# Patient Record
Sex: Female | Born: 1950 | Race: White | Hispanic: No | Marital: Single | Smoking: Never smoker
Health system: Southern US, Community
[De-identification: ages and names within clinical notes are randomized; demographics above are authoritative.]

## PROBLEM LIST (undated history)

## (undated) DIAGNOSIS — Z923 Personal history of irradiation: Secondary | ICD-10-CM

## (undated) DIAGNOSIS — E039 Hypothyroidism, unspecified: Secondary | ICD-10-CM

## (undated) DIAGNOSIS — Z9221 Personal history of antineoplastic chemotherapy: Secondary | ICD-10-CM

## (undated) DIAGNOSIS — M8580 Other specified disorders of bone density and structure, unspecified site: Secondary | ICD-10-CM

## (undated) DIAGNOSIS — B159 Hepatitis A without hepatic coma: Secondary | ICD-10-CM

## (undated) DIAGNOSIS — T7840XA Allergy, unspecified, initial encounter: Secondary | ICD-10-CM

## (undated) DIAGNOSIS — C55 Malignant neoplasm of uterus, part unspecified: Secondary | ICD-10-CM

## (undated) DIAGNOSIS — C50912 Malignant neoplasm of unspecified site of left female breast: Secondary | ICD-10-CM

## (undated) DIAGNOSIS — Z9289 Personal history of other medical treatment: Secondary | ICD-10-CM

## (undated) DIAGNOSIS — J449 Chronic obstructive pulmonary disease, unspecified: Secondary | ICD-10-CM

## (undated) DIAGNOSIS — C50919 Malignant neoplasm of unspecified site of unspecified female breast: Secondary | ICD-10-CM

## (undated) HISTORY — PX: CARPAL TUNNEL RELEASE: SHX101

## (undated) HISTORY — PX: SPINAL FUSION: SHX223

## (undated) HISTORY — DX: Malignant neoplasm of uterus, part unspecified: C55

## (undated) HISTORY — DX: Other specified disorders of bone density and structure, unspecified site: M85.80

## (undated) HISTORY — DX: Personal history of irradiation: Z92.3

## (undated) HISTORY — DX: Chronic obstructive pulmonary disease, unspecified: J44.9

## (undated) HISTORY — DX: Hepatitis a without hepatic coma: B15.9

## (undated) HISTORY — DX: Malignant neoplasm of unspecified site of left female breast: C50.912

## (undated) HISTORY — DX: Personal history of other medical treatment: Z92.89

## (undated) HISTORY — DX: Personal history of antineoplastic chemotherapy: Z92.21

## (undated) HISTORY — DX: Hypothyroidism, unspecified: E03.9

## (undated) HISTORY — DX: Malignant neoplasm of unspecified site of unspecified female breast: C50.919

## (undated) HISTORY — DX: Allergy, unspecified, initial encounter: T78.40XA

---

## 1977-06-22 HISTORY — PX: ABDOMINAL HYSTERECTOMY: SHX81

## 1986-06-22 DIAGNOSIS — B159 Hepatitis A without hepatic coma: Secondary | ICD-10-CM

## 1986-06-22 HISTORY — DX: Hepatitis a without hepatic coma: B15.9

## 2006-05-27 ENCOUNTER — Ambulatory Visit (HOSPITAL_COMMUNITY): Admission: RE | Admit: 2006-05-27 | Discharge: 2006-05-27 | Payer: Self-pay | Admitting: Specialist

## 2007-02-09 ENCOUNTER — Inpatient Hospital Stay (HOSPITAL_COMMUNITY): Admission: RE | Admit: 2007-02-09 | Discharge: 2007-02-11 | Payer: Self-pay | Admitting: Specialist

## 2007-04-14 ENCOUNTER — Ambulatory Visit: Payer: Self-pay

## 2007-09-08 ENCOUNTER — Encounter: Admission: RE | Admit: 2007-09-08 | Discharge: 2007-09-08 | Payer: Self-pay | Admitting: Orthopaedic Surgery

## 2008-05-22 ENCOUNTER — Ambulatory Visit: Payer: Self-pay | Admitting: Family Medicine

## 2009-08-28 ENCOUNTER — Ambulatory Visit: Payer: Self-pay | Admitting: Family Medicine

## 2010-09-30 ENCOUNTER — Ambulatory Visit: Payer: Self-pay

## 2010-10-29 ENCOUNTER — Ambulatory Visit: Payer: Self-pay

## 2010-11-04 NOTE — H&P (Signed)
[name], [name]                 ACCOUNT NO.:  [id number]   MEDICAL RECORD NO.:  [id number]           PATIENT TYPE:   LOCATION:                                 FACILITY:   PHYSICIAN:  Jene Every, M.D.    DATE OF BIRTH:  25-Feb-1951   DATE OF ADMISSION:  02/09/2007  DATE OF DISCHARGE:                              HISTORY & PHYSICAL   CHIEF COMPLAINT:  Back, leg and butt pain bilaterally.   HISTORY:  [name] is a pleasant 60 year old female, who sustained a  work-related injury, back in 2007.  She was found to do quite a bit of  repetitive work.  She had mainly low back pain at that time.  She was  initially seen at [facility], diagnosed with lumbar strain and  treated conservatively.  The patient subsequently developed a radicular-  type pain, was seen in our office for evaluation in November 2007, at  which point we obtained an MRI study which showed did show a grade 1-1/2  listhesis, as well as spinal stenosis L5-S1.  She has been sent for a  series of epidural steroid injections, which only gave her approximately  10% relief.  At this point,  further treatment options were discussed  with the patient.  Dr. Shelle Iron felt that her pain was facet mediated and  that she would benefit from first set screw fixation at L5-S1, to  stabilize the listhesis.  The risks and benefits of this were discussed  with the patient.  Pre-operative clearance was obtained, and she does  wish to proceed.   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   CURRENT MEDICATIONS:  Include ibuprofen p.r.n., over the counter allergy  medication, aspirin 81 mg daily, Centrum Silver one daily, black cohosh  root, Estroven p.m. q.h.s.   ALLERGIES:  SULFA WHICH CAUSES A RASH.  CODEINE WHICH MAKES HER  NAUSEOUS.   PREVIOUS SURGICAL HISTORY:  History of right carpal tunnel release by  Dr. Merlyn Lot, development of RSD.  She had a hysterectomy approximately 30  years ago, secondary to uterine cancer.   SOCIAL HISTORY:  The patient is single.  She has negative history of  tobacco use, as well as alcohol.  [name]  following surgery.  Primary care physician is Dr. Lorie Phenix at  [facility].   FAMILY HISTORY:  Father deceased at age 29 of heart disease.  Mother  deceased at age 104 of breast cancer.  Grandmother had a history of  rheumatoid arthritis.   REVIEW OF SYSTEMS:  GENERAL:  The patient denies any fever, chills,  night sweats or bleeding tendencies.  CNS:  No blurred double vision,  seizure, headache or paralysis.  RESPIRATORY:  No shortness of breath,  productive cough or hemoptysis.  CARDIOVASCULAR:  No chest pain, angina  or orthopnea.  GU:  No dysuria, hematuria, discharge.  GI:  No  nausea,  vomiting, diarrhea, constipation, melena or bloody stools.  MUSCULOSKELETAL:  As pertinent in HPI.   PHYSICAL EXAMINATION:  VITAL SIGNS:  Pulse is 60, respiratory 16, BP  130/86.  GENERAL:  This is well-developed, well-nourished female, sitting upright  in no acute distress.  She is slightly anxious in nature.  HEENT:  Atraumatic, normocephalic.  Pupils equal round and reactive to light,  EOMs intact.  NECK:  Supple.  No lymphadenopathy.  CHEST:  Clear to auscultation bilaterally.  No rhonchi, wheezes or  rales.  BREAST:  Not examined.  Not pertinent to HPI.  HEART:  Regular rate and rhythm without murmurs, gallops or rubs.  ABDOMEN:  Soft, nontender, nondistended.  Bowel sounds x4.  SKIN:  No rashes or lesions are noted.  Regarding the back, she does  have pain with forward flexion and extension.  She is tender to  palpation along the lower lumbar region.  She does have diminished  Achilles, as well as plantar flexion bilaterally.   IMPRESSION:  Spinal  stenosis, spondylolisthesis of L5-S1 with facet  arthropathy.   PLAN:  The patient to be admitted to [facility] to undergo L5-  S1, bilateral facet screw fixation, with local and autograft bone.      Roma Schanz, P.A.      Jene Every, M.D.  Electronically Signed    CS/MEDQ  D:  02/01/2007  T:  02/02/2007  Job:  [id number]

## 2010-11-04 NOTE — Op Note (Signed)
[name], [name]                 ACCOUNT NO.:  [id number]   MEDICAL RECORD NO.:  [id number]          PATIENT TYPE:  INP   LOCATION:  0004                         FACILITY:  [facility]   PHYSICIAN:  Jene Every, M.D.    DATE OF BIRTH:  [birth year]   DATE OF PROCEDURE:  02/09/2007  DATE OF DISCHARGE:                               OPERATIVE REPORT   PREOPERATIVE DIAGNOSIS:  Spinal stenosis, spondylolisthesis L5-S1.   POSTOPERATIVE DIAGNOSIS:  Spinal stenosis, spondylolisthesis L5-S1.   PROCEDURE PERFORMED:  1. Bilateral hemilaminotomy, lateral recess decompression,      foraminotomy of L5-S1.  2. Posterior fusion utilizing facet screw fixation L5-S1.  3. Lateral mass fusion utilizing autologous and allograft bone.   ANESTHESIA:  General.   ASSISTANT:  Alvy Beal, MD   BRIEF HISTORY AND INDICATIONS:  A 60 year old spondylolisthesis, spinal  stenosis due to facet arthropathy of 05/01.  She was indicated for  decompression bilaterally due to bilateral hip pain and intermittent leg  pain.  She had some extension type back pain, felt secondary to facet  arthropathy.  She is indicated for bilateral decompression.  I felt that  there was high risk of furthering the spondylolisthesis, so a  concomitant facet fusion was deemed appropriate utilizing autologous and  allograft bone graft.  Risks and benefits discussed including bleeding,  infection, damage to neurovascular structures, CSF leakage, epidural  fibrosis, adjacent segment disease, need for redo fusion in future,  anesthetic complications etc.   TECHNIQUE:  Patient placed in supine position after induction of  adequate anesthesia 1 gram Kefzol she was placed prone on Wilson frame  [employer] table.  All bony points well padded.  Lumbar region prepped  draped in the usual sterile fashion.  An 18 gauge spinal needle was  utilized to localize 5-1 interspace, incision was made from spinous  process of 5 to S1.  Subcutaneous tissue  was dissected.  Electrocautery  utilized achieve hemostasis.  Dorsolumbar fascia identified, divided  line of skin incision.  Paraspinous muscle elevated from lamina of 5 and  S1.  Confirmatory radiograph obtained at the 5-1 interspace.  Severe  hypertrophic facet arthropathy was noted bilaterally with facet  effusions.  Nearly obliterated the interlaminar space at 5-1.  We  performed decompressions bilaterally using the 2 mm Kerrison  undercutting the lamina of five and the facet.  Decompression the medial  border of the pedicle and foraminotomy of S1 was performed as well  utilizing 2 and 3 mm Kerrison.  There was severe lateral recess  stenosis, left greater than right.  Removed ligamentum flavum from the  interspace as well.  Examined the disk.  There was no disk herniation on  the left.  Passed a hockey stick probe out the foramen of 5 and S1,  found to be widely patent.  In a similar fashion we decompressed the  right hemilaminotomies of 5 and S1, decompressing the facet the medial  border of the pedicle, preserving a significant portion the inferior  process of 5 to accept the facet screw.  We decapsulated the facet and  curetted the  facets themselves and used a high-speed bur within the  facets and out into the ala.  Next we packed bone, autologous and  Actifuse bone within the facet joints and out into the ala.  Another x-  ray we used a cannulated neural, first a guide pin through the facet,  angulating into the pedicle into the vertical body in the appropriate  fashion on the AP and lateral x-ray.  I did bilaterally check the  foramen of 5 and 4.  They were found to be widely patent and without  evidence of compromise.  Both sides were drilled, tapped to 35 mm in  depth.  And a facet screw with a washer was utilized bilaterally with  excellent purchase bilaterally and compression of the facet joint.  Following this, a hockey stick probe was placed out in the foramen of 5  and  S1, found to be widely patent with no broaching of the pedicles of 5-  1 and the foramen widely patent.  Wound was copiously irrigated with  antibiotic irrigation.  No evidence of CSF leakage or active bleeding.  Placed autologous bone removed from the facet and Actifuse out into the  lateral masses after decorticating with a bur and a curette.  After this  was all confirmed by x-ray in the AP and lateral plane in terms of screw  fixation and placement.  Next McCullough retractors removed.  Copious  irrigation was utilized. electrocautery utilized to achieve hemostasis.  Dorsolumbar fascia reapproximated with #1 Vicryl interrupted figure-of-  eight sutures.  Subcutaneous tissue reapproximated 2-0 Vicryl simple  sutures. The skin was reapproximated with staples.  We placed a Hemovac  in the deep paraspinous musculature and the subcutaneous tissue and  connected the Hemovac.  Sterile dressing was applied.  She was placed  supine on hospital bed, extubated without difficulty and transported to  the recovery room in satisfactory condition.  The patient tolerated the  procedure well without complication.      Jene Every, M.D.  Electronically Signed     JB/MEDQ  D:  02/09/2007  T:  02/10/2007  Job:  [id number]   cc:   Alvy Beal, MD  Fax: [phone]

## 2011-04-03 LAB — BASIC METABOLIC PANEL
BUN: 12
Calcium: 10.2
Calcium: 9.3
Creatinine, Ser: 0.99
GFR calc non Af Amer: 58 — ABNORMAL LOW
GFR calc non Af Amer: >60
Potassium: 3.9
Sodium: 144

## 2011-04-03 LAB — URINALYSIS, ROUTINE W REFLEX MICROSCOPIC
Ketones, ur: NEGATIVE
Nitrite: NEGATIVE
Protein, ur: NEGATIVE
Urobilinogen, UA: 0.2

## 2011-04-03 LAB — TYPE AND SCREEN
ABO/RH(D): A POS
Antibody Screen: NEGATIVE

## 2011-04-03 LAB — CBC
HCT: 39.8
Hemoglobin: 11.2 — ABNORMAL LOW
Hemoglobin: 13.1
MCHC: 32.9
MCHC: 33.6
MCV: 90.2
MCV: 90.7
Platelets: 238
RBC: 3.69 — ABNORMAL LOW
RDW: 12.5
RDW: 13.3
WBC: 6.3
WBC: 8.7

## 2011-04-03 LAB — PROTIME-INR: INR: 0.9

## 2011-04-03 LAB — URINE MICROSCOPIC-ADD ON

## 2011-10-27 ENCOUNTER — Ambulatory Visit: Payer: Self-pay

## 2012-06-22 DIAGNOSIS — J449 Chronic obstructive pulmonary disease, unspecified: Secondary | ICD-10-CM

## 2012-06-22 HISTORY — PX: BREAST LUMPECTOMY: SHX2

## 2012-06-22 HISTORY — PX: BREAST BIOPSY: SHX20

## 2012-06-22 HISTORY — DX: Chronic obstructive pulmonary disease, unspecified: J44.9

## 2012-06-22 HISTORY — PX: PORTACATH PLACEMENT: SHX2246

## 2012-09-14 ENCOUNTER — Ambulatory Visit: Payer: Self-pay | Admitting: Gastroenterology

## 2012-09-14 HISTORY — PX: COLONOSCOPY: SHX174

## 2012-11-25 ENCOUNTER — Ambulatory Visit: Payer: Self-pay | Admitting: Family Medicine

## 2012-12-05 ENCOUNTER — Ambulatory Visit: Payer: Self-pay | Admitting: Family Medicine

## 2012-12-06 ENCOUNTER — Ambulatory Visit (INDEPENDENT_AMBULATORY_CARE_PROVIDER_SITE_OTHER): Payer: Medicare Other | Admitting: General Surgery

## 2012-12-06 ENCOUNTER — Other Ambulatory Visit: Payer: Self-pay

## 2012-12-06 ENCOUNTER — Encounter: Payer: Self-pay | Admitting: General Surgery

## 2012-12-06 VITALS — BP 140/78 | HR 76 | Resp 12 | Ht 63.0 in | Wt 160.0 lb

## 2012-12-06 DIAGNOSIS — N63 Unspecified lump in unspecified breast: Secondary | ICD-10-CM

## 2012-12-06 DIAGNOSIS — N6320 Unspecified lump in the left breast, unspecified quadrant: Secondary | ICD-10-CM

## 2012-12-06 NOTE — Patient Instructions (Signed)

## 2012-12-06 NOTE — Progress Notes (Signed)
Patient ID: [name], female   DOB: 08-17-1950, 62 y.o.   MRN: [id number]  Chief Complaint  Patient presents with  . Procedure    left breast biopsy    HPI [name] is a 62 y.o. female here today for an left breast biopsy. Mammogram and ultrasound done 12-05-12 CAT 4. Family history of breast cancer includes mother. Patient does perform regular self breast checks and gets regular mammograms done. BRCA negative through the [facility] in the past..  HPI  Past Medical History  Diagnosis Date  . Allergy   . Cancer     uterine   . Hepatitis A     Past Surgical History  Procedure Laterality Date  . Abdominal hysterectomy  1979  . Back surgery    . Breast biopsy      Family History  Problem Relation Age of Onset  . Breast cancer Mother     Social History History  Substance Use Topics  . Smoking status: Never Smoker   . Smokeless tobacco: Never Used  . Alcohol Use: No    Allergies  Allergen Reactions  . Codeine     sick  . Sulfa Antibiotics Rash    Current Outpatient Prescriptions  Medication Sig Dispense Refill  . aspirin 81 MG tablet Take 81 mg by mouth daily.      . Multiple Vitamins-Minerals (CENTRUM PO) Take by mouth.      . Pseudoephedrine HCl (SINUS & ALLERGY 12 HOUR PO) Take 1 tablet by mouth.       No current facility-administered medications for this visit.    Review of Systems Review of Systems  Constitutional: Negative.   Respiratory: Negative.   Cardiovascular: Negative.     Blood pressure 140/78, pulse 76, resp. rate 12, height 5\' 3"  (1.6 m), weight 160 lb (72.576 kg).  Physical Exam Physical Exam  Constitutional: She appears well-developed and well-nourished.  Neck: Neck supple.  Cardiovascular: Normal rate and normal heart sounds.   Pulmonary/Chest: Breath sounds normal. Right breast exhibits no inverted nipple, no mass, no nipple discharge, no skin change and no tenderness. Left breast exhibits no inverted nipple, no mass, no  nipple discharge, no skin change and no tenderness.  Fullness at 3 o'clock left breast  Lymphadenopathy:    She has no cervical adenopathy.    She has no axillary adenopathy.  Neurological: She is alert.  Skin: Skin is warm and dry.    Data Reviewed Screening mammogram dated November 25, 2012 showed scattered fibroglandular tissue. An asymmetric density was identified the lateral aspect left breast for which additional views were requested. The right breast was unremarkable. BI-RAD-0.  Focal spot compression views and ultrasound the left breast a December 05, 2012 were reviewed with Dr. Excell Seltzer. An irregular mass that is hypoechoic with posterior acoustic shadowing is identified in the 3:00 position of the breast. A previously identified an unchanged nodule in the upper-outer quadrant is unchanged. A generous lymph node in the left axilla is unchanged from past exams.  Ultrasound examination of the left breast confirmed a 2.2 x 2.4 x 2.5 cmlymph node in the left axilla. No vascular flow was noted. Slight distortion of architecture.  In the left breast at the 3:00 position a 1.1 x 1.3 x 1.4 cm mass that was slightly wider than it was all identified 3 cm from the nipple.  The patient was amenable to biopsy. 10 cc of 0.5% Xylocaine with 0.25% Marcaine with one 200,000 of epinephrine was utilized  well tolerated. Chlor prep was applied to the skin. A 10-gauge Encor device was used to take multiple samples from the lesion. Scant bleeding was noted. The skin defect was closed with benzoin and Steri-Strips followed by Telfa Tegaderm dressing. Instructions were provided to the patient for postoperative wound care.  Assessment    Left breast mass, suspicious for malignancy.     Plan    The patient will be contacted when the pathology is available.        [name] 12/06/2012, 8:48 PM

## 2012-12-08 ENCOUNTER — Ambulatory Visit: Payer: Self-pay | Admitting: Hematology and Oncology

## 2012-12-08 ENCOUNTER — Ambulatory Visit (INDEPENDENT_AMBULATORY_CARE_PROVIDER_SITE_OTHER): Payer: Medicare Other | Admitting: General Surgery

## 2012-12-08 ENCOUNTER — Ambulatory Visit: Payer: Self-pay | Admitting: General Surgery

## 2012-12-08 ENCOUNTER — Encounter: Payer: Self-pay | Admitting: General Surgery

## 2012-12-08 VITALS — BP 140/78 | HR 76 | Resp 14 | Ht 63.0 in | Wt 160.0 lb

## 2012-12-08 DIAGNOSIS — C50912 Malignant neoplasm of unspecified site of left female breast: Secondary | ICD-10-CM

## 2012-12-08 DIAGNOSIS — R599 Enlarged lymph nodes, unspecified: Secondary | ICD-10-CM

## 2012-12-08 DIAGNOSIS — C50412 Malignant neoplasm of upper-outer quadrant of left female breast: Secondary | ICD-10-CM | POA: Insufficient documentation

## 2012-12-08 DIAGNOSIS — Z853 Personal history of malignant neoplasm of breast: Secondary | ICD-10-CM | POA: Insufficient documentation

## 2012-12-08 DIAGNOSIS — C50919 Malignant neoplasm of unspecified site of unspecified female breast: Secondary | ICD-10-CM

## 2012-12-08 DIAGNOSIS — R590 Localized enlarged lymph nodes: Secondary | ICD-10-CM | POA: Insufficient documentation

## 2012-12-08 HISTORY — DX: Malignant neoplasm of unspecified site of left female breast: C50.912

## 2012-12-08 LAB — COMPREHENSIVE METABOLIC PANEL
Alkaline Phosphatase: 106 U/L (ref 50–136)
BUN: 17 mg/dL (ref 7–18)
Bilirubin,Total: 0.3 mg/dL (ref 0.2–1.0)
Calcium, Total: 9.6 mg/dL (ref 8.5–10.1)
Chloride: 105 mmol/L (ref 98–107)
EGFR (African American): >60
Potassium: 3.8 mmol/L (ref 3.5–5.1)
SGOT(AST): 24 U/L (ref 15–37)
Sodium: 139 mmol/L (ref 136–145)
Total Protein: 8 g/dL (ref 6.4–8.2)

## 2012-12-08 LAB — CBC WITH DIFFERENTIAL/PLATELET
Basophil %: 0.7 %
Eosinophil %: 0.6 %
Lymphocyte %: 19.7 %
MCH: 30.7 pg (ref 26.0–34.0)
MCHC: 34.4 g/dL (ref 32.0–36.0)
MCV: 89 fL (ref 80–100)
Monocyte #: 0.5 x10 3/mm (ref 0.2–0.9)
Monocyte %: 6.8 %
Neutrophil #: 5.2 10*3/uL (ref 1.4–6.5)
Neutrophil %: 72.2 %
Platelet: 283 10*3/uL (ref 150–440)
RBC: 4.31 10*6/uL (ref 3.80–5.20)
WBC: 7.2 10*3/uL (ref 3.6–11.0)

## 2012-12-08 NOTE — Patient Instructions (Addendum)
Patient to have labs and chest x-ray at [facility].   This patient is to see Dr. Wendie Simmer at the [facility] on 12-09-12 at 11 am.      CARE AFTER BREAST BIOPSY  1. Leave the dressing on that your doctor applied after surgery. It is waterproof. You may bathe, shower and/or swim. The dressing will probably remain intact until your return office visit. If the dressing comes off, you will see small strips of tape against your skin on the incision. Do not remove these strips.  2. You may want to use a gauze,cloth or similar protection in your bra to prevent rubbing against your dressing and incision. This is not necessary, but you may feel more comfortable doing so.  3. It is recommended that you wear a bra day and night to give support to the breast. This will prevent the weight of the breast from pulling on the incision.  4. Your breast will feel hard and lumpy under the incision. Do not be alarmed. This is the underlying stitching of tissue. Softening of this tissue will occur in time.  5. Make sure you call the office and schedule an appointment in one week after your surgery. The office phone number is [phone]. The [facility] at [facility] may have already done this for you.  6. You will notice about a week after your office visit that the strips of the tape on your incision will begin to loosen. These may then be removed.  7. Report to your doctor any of the following:  * Severe pain not relieved by your pain medication  *Redness of the incision  * Drainage from the incision  *Fever greater than 101 degrees

## 2012-12-08 NOTE — Progress Notes (Signed)
Patient ID: [name], female   DOB: 09/15/1950, 62 y.o.   MRN: [id number]  Chief Complaint  Patient presents with  . Other    discussion    HPI [name] is a 62 y.o. female here today to discuss her recently completed breast biopsy. The patient is accompanied today by her daughter, granddaughter, son and 3). She been notified by phone but the biopsy result to confirm cancer.  The patient tolerated the core biopsy of the left breast without significant discomfort.  Since her last visit her mammograms were reviewed and the long-standing modest enlargement of the left axillary node has undergone significant change with a 50% increase in size and a marked increase in density.  The patient's case was reviewed at the [facility] tumor board today and biopsy of the node felt appropriate as it would change her clinical stage in may make her a candidate for neoadjuvant chemotherapy. This information was reviewed with the patient and her family.      HPI  Past Medical History  Diagnosis Date  . Allergy   . Cancer     uterine   . Hepatitis A     Past Surgical History  Procedure Laterality Date  . Abdominal hysterectomy  1979  . Back surgery    . Breast biopsy      Family History  Problem Relation Age of Onset  . Breast cancer Mother     Social History History  Substance Use Topics  . Smoking status: Never Smoker   . Smokeless tobacco: Never Used  . Alcohol Use: No    Allergies  Allergen Reactions  . Codeine     sick  . Sulfa Antibiotics Rash    Current Outpatient Prescriptions  Medication Sig Dispense Refill  . aspirin 81 MG tablet Take 81 mg by mouth daily.      . Multiple Vitamins-Minerals (CENTRUM PO) Take by mouth.      . Pseudoephedrine HCl (SINUS & ALLERGY 12 HOUR PO) Take 1 tablet by mouth.       No current facility-administered medications for this visit.    Review of Systems Review of Systems  Blood pressure 140/78, pulse 76, resp. rate 14, height  5\' 3"  (1.6 m), weight 160 lb (72.576 kg).  Physical Exam Physical Exam  Data Reviewed Pathology shows invasive mammary cancer in the left breast. Clinical stage I, T1c.  Ultrasound examination of the left axilla shows an enlarged node in the lower aspect. The patient was amenable to core biopsy. 10 cc of 0.5% Xylocaine with 0.25% Marcaine with 1-200,000 of epinephrine was utilized to well tolerated. ChloraPrep was applied to the skin. A 14-gauge Bard Tru-Cut device was used and 3 core samples obtained with scant discomfort. These were hand carry to pathology in formalin for processing. The skin defect was closed with benzoin and Steri-Strips followed by Telfa and Tegaderm dressing.  Assessment    Left breast cancer.    Plan    The patient will meet with medical oncology tomorrow to discuss the results of today's node biopsy and recommendations regarding management/neoadjuvant chemotherapy.    Patient to have labs (CBC, Met C, CEA, and CA 27-29) and chest x-ray at [facility].    This patient is to see Dr. Wendie Simmer at the [facility] on 12-09-12 at 11 am.    [name] 12/08/2012, 3:17 PM

## 2012-12-09 ENCOUNTER — Ambulatory Visit: Payer: Self-pay | Admitting: Hematology and Oncology

## 2012-12-09 ENCOUNTER — Telehealth: Payer: Self-pay | Admitting: General Surgery

## 2012-12-09 ENCOUNTER — Other Ambulatory Visit: Payer: Self-pay | Admitting: General Surgery

## 2012-12-09 DIAGNOSIS — C50919 Malignant neoplasm of unspecified site of unspecified female breast: Secondary | ICD-10-CM

## 2012-12-09 LAB — PATHOLOGY

## 2012-12-09 NOTE — Telephone Encounter (Signed)
The patient was notified by phone of the results of her core biopsy completed yesterday afternoon of the left axillary node. This did show an embolic tumor deposits consistent with 8 adenocarcinoma source. Node was not however totally replaced with malignancy.  She is a candidate for neoadjuvant chemotherapy on discussion with Dr. Wendie Simmer.  The patient had been apprised of the pros and cons of power port placement prior to her node biopsy yesterday, and she is amenable to proceed.  This will be scheduled for the week of 07/14/2012.

## 2012-12-12 ENCOUNTER — Telehealth: Payer: Self-pay | Admitting: *Deleted

## 2012-12-12 NOTE — Telephone Encounter (Signed)
Patient has been contacted today regarding port placement that has been scheduled for 12-16-12 at [facility]. This patient is to pre-admit 12-13-12 at 2:15 pm. It is okay for patient to continue 81 mg aspirin. She will call if she has further questions.

## 2012-12-13 ENCOUNTER — Encounter: Payer: Self-pay | Admitting: General Surgery

## 2012-12-14 ENCOUNTER — Ambulatory Visit (INDEPENDENT_AMBULATORY_CARE_PROVIDER_SITE_OTHER): Payer: Medicare Other | Admitting: *Deleted

## 2012-12-14 DIAGNOSIS — C50919 Malignant neoplasm of unspecified site of unspecified female breast: Secondary | ICD-10-CM

## 2012-12-14 NOTE — Progress Notes (Signed)
Patient here today for follow up post left breast biopsy. No dressing or steristrip in place.  Minimal bruising noted.  The patient is aware that a heating pad may be used for comfort as needed.  Aware of pathology. Follow up as scheduled for port Friday

## 2012-12-14 NOTE — Patient Instructions (Addendum)
Continue self breast exams. Call office for any new breast issues or concerns. Surgery Friday for port a cath

## 2012-12-15 ENCOUNTER — Telehealth: Payer: Self-pay

## 2012-12-15 NOTE — Telephone Encounter (Signed)
Patient called wanting blood laboratory results. Results reviewed with the patient. She has no questions at this time. Will call back if she has any further questions

## 2012-12-16 ENCOUNTER — Ambulatory Visit: Payer: Self-pay | Admitting: General Surgery

## 2012-12-16 DIAGNOSIS — C50919 Malignant neoplasm of unspecified site of unspecified female breast: Secondary | ICD-10-CM

## 2012-12-19 ENCOUNTER — Encounter: Payer: Self-pay | Admitting: General Surgery

## 2012-12-20 ENCOUNTER — Encounter: Payer: Self-pay | Admitting: General Surgery

## 2012-12-20 ENCOUNTER — Ambulatory Visit: Payer: Self-pay | Admitting: Hematology and Oncology

## 2012-12-20 LAB — COMPREHENSIVE METABOLIC PANEL
Alkaline Phosphatase: 101 U/L (ref 50–136)
BUN: 17 mg/dL (ref 7–18)
Bilirubin,Total: 0.4 mg/dL (ref 0.2–1.0)
Chloride: 103 mmol/L (ref 98–107)
Osmolality: 280 (ref 275–301)
SGOT(AST): 17 U/L (ref 15–37)
Total Protein: 7.6 g/dL (ref 6.4–8.2)

## 2012-12-20 LAB — PATHOLOGY

## 2012-12-20 LAB — CBC CANCER CENTER
Basophil %: 0.9 %
HCT: 37.9 % (ref 35.0–47.0)
HGB: 12.8 g/dL (ref 12.0–16.0)
Lymphocyte #: 2.4 x10 3/mm (ref 1.0–3.6)
MCH: 30.7 pg (ref 26.0–34.0)
MCHC: 33.7 g/dL (ref 32.0–36.0)
Monocyte #: 0.5 x10 3/mm (ref 0.2–0.9)
Platelet: 254 x10 3/mm (ref 150–440)
RDW: 13.1 % (ref 11.5–14.5)
WBC: 8.9 x10 3/mm (ref 3.6–11.0)

## 2012-12-27 LAB — CBC CANCER CENTER
Bands: 11 %
Eosinophil: 4 %
Lymphocytes: 33 %
MCH: 31 pg (ref 26.0–34.0)
MCHC: 34 g/dL (ref 32.0–36.0)
MCV: 91 fL (ref 80–100)
Monocytes: 2 %
Platelet: 208 x10 3/mm (ref 150–440)
RBC: 4.24 10*6/uL (ref 3.80–5.20)
WBC: 5.8 x10 3/mm (ref 3.6–11.0)

## 2013-01-03 LAB — CBC CANCER CENTER
Basophil #: 0.1 x10 3/mm (ref 0.0–0.1)
Eosinophil #: 0.1 x10 3/mm (ref 0.0–0.7)
Eosinophil %: 0.8 %
HCT: 38.6 % (ref 35.0–47.0)
HGB: 13.1 g/dL (ref 12.0–16.0)
Lymphocyte #: 2.7 x10 3/mm (ref 1.0–3.6)
Lymphocyte %: 18.6 %
MCHC: 33.8 g/dL (ref 32.0–36.0)
MCV: 90 fL (ref 80–100)
Monocyte %: 7.3 %
Platelet: 203 x10 3/mm (ref 150–440)
RDW: 13.2 % (ref 11.5–14.5)
WBC: 14.6 x10 3/mm — ABNORMAL HIGH (ref 3.6–11.0)

## 2013-01-16 LAB — CBC CANCER CENTER
Basophil %: 1.8 %
HCT: 38.6 % (ref 35.0–47.0)
HGB: 12.8 g/dL (ref 12.0–16.0)
Lymphocyte #: 2.1 x10 3/mm (ref 1.0–3.6)
Lymphocyte %: 28.6 %
MCV: 90 fL (ref 80–100)
Monocyte %: 9.4 %
Neutrophil %: 59.1 %
RBC: 4.29 10*6/uL (ref 3.80–5.20)
RDW: 14 % (ref 11.5–14.5)
WBC: 7.3 x10 3/mm (ref 3.6–11.0)

## 2013-01-16 LAB — BASIC METABOLIC PANEL
Anion Gap: 11 (ref 7–16)
BUN: 25 mg/dL — ABNORMAL HIGH (ref 7–18)
Calcium, Total: 9.6 mg/dL (ref 8.5–10.1)
Chloride: 104 mmol/L (ref 98–107)
Co2: 26 mmol/L (ref 21–32)
EGFR (African American): >60
EGFR (Non-African Amer.): 60 — ABNORMAL LOW
Glucose: 87 mg/dL (ref 65–99)
Osmolality: 285 (ref 275–301)
Potassium: 4.4 mmol/L (ref 3.5–5.1)
Sodium: 141 mmol/L (ref 136–145)

## 2013-01-20 ENCOUNTER — Ambulatory Visit: Payer: Self-pay | Admitting: Hematology and Oncology

## 2013-01-24 LAB — CBC CANCER CENTER
Eosinophil %: 7.4 %
MCH: 31.4 pg (ref 26.0–34.0)
Monocyte #: 0.1 x10 3/mm — ABNORMAL LOW (ref 0.2–0.9)
Monocyte %: 1.7 %
Neutrophil #: 3.1 x10 3/mm (ref 1.4–6.5)
Platelet: 231 x10 3/mm (ref 150–440)
RBC: 3.71 10*6/uL — ABNORMAL LOW (ref 3.80–5.20)
RDW: 13.5 % (ref 11.5–14.5)
WBC: 4.8 x10 3/mm (ref 3.6–11.0)

## 2013-01-31 LAB — ER/PR,IMMUNOHISTOCHEM,PARAFFIN
Estrogen Receptor IHC: 10 %
Progesterone Recp IP: 0 %

## 2013-01-31 LAB — CBC CANCER CENTER
Basophil #: 0.1 x10 3/mm (ref 0.0–0.1)
Basophil %: 2.4 %
Eosinophil #: 0.3 x10 3/mm (ref 0.0–0.7)
Eosinophil %: 7.9 %
HGB: 12.2 g/dL (ref 12.0–16.0)
Lymphocyte #: 1.5 x10 3/mm (ref 1.0–3.6)
Lymphocyte %: 45.5 %
MCHC: 34.4 g/dL (ref 32.0–36.0)
MCV: 92 fL (ref 80–100)
Monocyte %: 16.8 %
Neutrophil #: 0.9 x10 3/mm — ABNORMAL LOW (ref 1.4–6.5)
Neutrophil %: 27.4 %
Platelet: 282 x10 3/mm (ref 150–440)

## 2013-01-31 LAB — HER-2 / NEU, FISH
Avg Num CEP17 probes/nucleus:: 3.1
Avg Num Her-2 signals/nucleus:: 13
HER-2/CEP17 Ratio: 4.27

## 2013-02-06 LAB — BASIC METABOLIC PANEL
Anion Gap: 8 (ref 7–16)
BUN: 18 mg/dL (ref 7–18)
Chloride: 103 mmol/L (ref 98–107)
Co2: 28 mmol/L (ref 21–32)
Osmolality: 280 (ref 275–301)
Potassium: 4.1 mmol/L (ref 3.5–5.1)
Sodium: 139 mmol/L (ref 136–145)

## 2013-02-06 LAB — CBC CANCER CENTER
Basophil %: 1 %
Eosinophil %: 2.9 %
HGB: 12.4 g/dL (ref 12.0–16.0)
MCH: 31.8 pg (ref 26.0–34.0)
Monocyte #: 1.1 x10 3/mm — ABNORMAL HIGH (ref 0.2–0.9)
Monocyte %: 15.9 %
Neutrophil #: 3.3 x10 3/mm (ref 1.4–6.5)
RBC: 3.91 10*6/uL (ref 3.80–5.20)
RDW: 14.2 % (ref 11.5–14.5)
WBC: 6.7 x10 3/mm (ref 3.6–11.0)

## 2013-02-06 LAB — HEPATIC FUNCTION PANEL A (ARMC)
Albumin: 3.9 g/dL (ref 3.4–5.0)
Alkaline Phosphatase: 117 U/L (ref 50–136)
Bilirubin,Total: 0.2 mg/dL (ref 0.2–1.0)
SGOT(AST): 20 U/L (ref 15–37)
SGPT (ALT): 28 U/L (ref 12–78)
Total Protein: 7.3 g/dL (ref 6.4–8.2)

## 2013-02-09 ENCOUNTER — Inpatient Hospital Stay: Payer: Self-pay | Admitting: Hematology and Oncology

## 2013-02-09 LAB — COMPREHENSIVE METABOLIC PANEL
Albumin: 4.1 g/dL (ref 3.4–5.0)
Alkaline Phosphatase: 181 U/L — ABNORMAL HIGH (ref 50–136)
Anion Gap: 6 — ABNORMAL LOW (ref 7–16)
BUN: 15 mg/dL (ref 7–18)
Bilirubin,Total: 0.3 mg/dL (ref 0.2–1.0)
Calcium, Total: 9.7 mg/dL (ref 8.5–10.1)
Chloride: 98 mmol/L (ref 98–107)
Co2: 29 mmol/L (ref 21–32)
Creatinine: 0.79 mg/dL (ref 0.60–1.30)
EGFR (African American): >60
EGFR (Non-African Amer.): >60
Glucose: 99 mg/dL (ref 65–99)
Osmolality: 267 (ref 275–301)
Potassium: 4 mmol/L (ref 3.5–5.1)
SGOT(AST): 27 U/L (ref 15–37)
SGPT (ALT): 39 U/L (ref 12–78)
Sodium: 133 mmol/L — ABNORMAL LOW (ref 136–145)
Total Protein: 7.7 g/dL (ref 6.4–8.2)

## 2013-02-09 LAB — CBC WITH DIFFERENTIAL/PLATELET
Basophil #: 0.2 10*3/uL — ABNORMAL HIGH (ref 0.0–0.1)
Basophil %: 0.3 %
Eosinophil #: 0 10*3/uL (ref 0.0–0.7)
Eosinophil %: 0 %
HCT: 38.1 % (ref 35.0–47.0)
HGB: 12.9 g/dL (ref 12.0–16.0)
Lymphocyte #: 1.1 10*3/uL (ref 1.0–3.6)
Lymphocyte %: 1.9 %
MCH: 30.4 pg (ref 26.0–34.0)
MCHC: 33.8 g/dL (ref 32.0–36.0)
MCV: 90 fL (ref 80–100)
Monocyte #: 0.7 x10 3/mm (ref 0.2–0.9)
Monocyte %: 1.2 %
Neutrophil #: 56.3 10*3/uL — ABNORMAL HIGH (ref 1.4–6.5)
Neutrophil %: 96.6 %
Platelet: 344 10*3/uL (ref 150–440)
RBC: 4.24 10*6/uL (ref 3.80–5.20)
RDW: 14.2 % (ref 11.5–14.5)
WBC: 58.3 10*3/uL — ABNORMAL HIGH (ref 3.6–11.0)

## 2013-02-10 LAB — BASIC METABOLIC PANEL
Anion Gap: 6 — ABNORMAL LOW (ref 7–16)
Co2: 28 mmol/L (ref 21–32)
EGFR (African American): >60
Glucose: 88 mg/dL (ref 65–99)
Osmolality: 280 (ref 275–301)

## 2013-02-10 LAB — CBC WITH DIFFERENTIAL/PLATELET
Eosinophil #: 0.1 10*3/uL (ref 0.0–0.7)
HCT: 36.1 % (ref 35.0–47.0)
HGB: 12.1 g/dL (ref 12.0–16.0)
MCH: 30.3 pg (ref 26.0–34.0)
MCHC: 33.5 g/dL (ref 32.0–36.0)
MCV: 91 fL (ref 80–100)
Monocyte #: 0.7 x10 3/mm (ref 0.2–0.9)
Monocyte %: 1.4 %
Neutrophil #: 46.6 10*3/uL — ABNORMAL HIGH (ref 1.4–6.5)
Neutrophil %: 94.8 %
RBC: 3.98 10*6/uL (ref 3.80–5.20)
RDW: 14.9 % — ABNORMAL HIGH (ref 11.5–14.5)
WBC: 49.3 10*3/uL — ABNORMAL HIGH (ref 3.6–11.0)

## 2013-02-17 LAB — CBC CANCER CENTER
Basophil #: 0.1 x10 3/mm (ref 0.0–0.1)
Eosinophil #: 0.1 x10 3/mm (ref 0.0–0.7)
HCT: 38 % (ref 35.0–47.0)
HGB: 12.6 g/dL (ref 12.0–16.0)
Lymphocyte #: 2.1 x10 3/mm (ref 1.0–3.6)
MCH: 30.3 pg (ref 26.0–34.0)
MCHC: 33.2 g/dL (ref 32.0–36.0)
MCV: 91 fL (ref 80–100)
Monocyte #: 0.8 x10 3/mm (ref 0.2–0.9)
Neutrophil #: 9.7 x10 3/mm — ABNORMAL HIGH (ref 1.4–6.5)
Neutrophil %: 75.4 %
Platelet: 205 x10 3/mm (ref 150–440)
RBC: 4.16 10*6/uL (ref 3.80–5.20)
RDW: 14.4 % (ref 11.5–14.5)

## 2013-02-20 ENCOUNTER — Ambulatory Visit: Payer: Self-pay | Admitting: Hematology and Oncology

## 2013-02-28 LAB — COMPREHENSIVE METABOLIC PANEL
Albumin: 3.9 g/dL (ref 3.4–5.0)
Alkaline Phosphatase: 130 U/L (ref 50–136)
Anion Gap: 8 (ref 7–16)
BUN: 18 mg/dL (ref 7–18)
Bilirubin,Total: 0.3 mg/dL (ref 0.2–1.0)
Creatinine: 1.01 mg/dL (ref 0.60–1.30)
EGFR (African American): >60
EGFR (Non-African Amer.): 60 — ABNORMAL LOW
SGPT (ALT): 37 U/L (ref 12–78)
Sodium: 140 mmol/L (ref 136–145)
Total Protein: 7.3 g/dL (ref 6.4–8.2)

## 2013-02-28 LAB — CBC CANCER CENTER
Eosinophil #: 0.2 x10 3/mm (ref 0.0–0.7)
Eosinophil %: 2.1 %
HCT: 36.9 % (ref 35.0–47.0)
HGB: 12.3 g/dL (ref 12.0–16.0)
MCH: 30.7 pg (ref 26.0–34.0)
MCV: 92 fL (ref 80–100)
Monocyte #: 0.9 x10 3/mm (ref 0.2–0.9)
Monocyte %: 12.5 %
Neutrophil #: 4.2 x10 3/mm (ref 1.4–6.5)
Neutrophil %: 57.6 %
Platelet: 318 x10 3/mm (ref 150–440)
RBC: 4.01 10*6/uL (ref 3.80–5.20)
RDW: 15.3 % — ABNORMAL HIGH (ref 11.5–14.5)
WBC: 7.3 x10 3/mm (ref 3.6–11.0)

## 2013-03-07 LAB — CBC CANCER CENTER
Basophil #: 0.1 x10 3/mm (ref 0.0–0.1)
Eosinophil #: 0.2 x10 3/mm (ref 0.0–0.7)
HGB: 11.6 g/dL — ABNORMAL LOW (ref 12.0–16.0)
Lymphocyte #: 0.9 x10 3/mm — ABNORMAL LOW (ref 1.0–3.6)
MCH: 31 pg (ref 26.0–34.0)
MCHC: 33.5 g/dL (ref 32.0–36.0)
MCV: 93 fL (ref 80–100)
Monocyte %: 2.7 %
Neutrophil %: 60.1 %
Platelet: 268 x10 3/mm (ref 150–440)
RBC: 3.73 10*6/uL — ABNORMAL LOW (ref 3.80–5.20)
RDW: 14.7 % — ABNORMAL HIGH (ref 11.5–14.5)
WBC: 3.3 x10 3/mm — ABNORMAL LOW (ref 3.6–11.0)

## 2013-03-14 LAB — CBC CANCER CENTER
Basophil #: 0.1 x10 3/mm (ref 0.0–0.1)
Eosinophil #: 0.5 x10 3/mm (ref 0.0–0.7)
Eosinophil %: 16.8 %
Lymphocyte #: 1 x10 3/mm (ref 1.0–3.6)
Lymphocyte %: 33 %
MCH: 31.3 pg (ref 26.0–34.0)
MCHC: 33.6 g/dL (ref 32.0–36.0)
MCV: 93 fL (ref 80–100)
Monocyte #: 0.4 x10 3/mm (ref 0.2–0.9)
Monocyte %: 11.2 %
Neutrophil #: 1.1 x10 3/mm — ABNORMAL LOW (ref 1.4–6.5)
Neutrophil %: 36.3 %
Platelet: 225 x10 3/mm (ref 150–440)
WBC: 3.1 x10 3/mm — ABNORMAL LOW (ref 3.6–11.0)

## 2013-03-21 LAB — CBC CANCER CENTER
Basophil #: 0.2 x10 3/mm — ABNORMAL HIGH (ref 0.0–0.1)
Basophil %: 2.9 %
Eosinophil #: 0.4 x10 3/mm (ref 0.0–0.7)
Eosinophil %: 7.6 %
HCT: 37.6 % (ref 35.0–47.0)
Lymphocyte %: 35.2 %
MCH: 31.1 pg (ref 26.0–34.0)
MCHC: 33.3 g/dL (ref 32.0–36.0)
MCV: 94 fL (ref 80–100)
Monocyte #: 1 x10 3/mm — ABNORMAL HIGH (ref 0.2–0.9)
Monocyte %: 19.5 %
Platelet: 372 x10 3/mm (ref 150–440)
RBC: 4.03 10*6/uL (ref 3.80–5.20)
RDW: 16 % — ABNORMAL HIGH (ref 11.5–14.5)

## 2013-03-21 LAB — BASIC METABOLIC PANEL
BUN: 20 mg/dL — ABNORMAL HIGH (ref 7–18)
Calcium, Total: 9 mg/dL (ref 8.5–10.1)
Chloride: 104 mmol/L (ref 98–107)
Co2: 27 mmol/L (ref 21–32)
Creatinine: 1.02 mg/dL (ref 0.60–1.30)
EGFR (African American): >60
EGFR (Non-African Amer.): 59 — ABNORMAL LOW
Osmolality: 283 (ref 275–301)
Potassium: 4 mmol/L (ref 3.5–5.1)
Sodium: 141 mmol/L (ref 136–145)

## 2013-03-22 ENCOUNTER — Ambulatory Visit: Payer: Self-pay | Admitting: Hematology and Oncology

## 2013-03-28 LAB — CBC CANCER CENTER
Basophil #: 0.1 x10 3/mm (ref 0.0–0.1)
Basophil %: 1.5 %
Eosinophil #: 1.2 x10 3/mm — ABNORMAL HIGH (ref 0.0–0.7)
Eosinophil %: 16.7 %
HCT: 36.8 % (ref 35.0–47.0)
HGB: 12.5 g/dL (ref 12.0–16.0)
Lymphocyte #: 1.9 x10 3/mm (ref 1.0–3.6)
Lymphocyte %: 26.6 %
MCH: 31.8 pg (ref 26.0–34.0)
MCHC: 33.9 g/dL (ref 32.0–36.0)
MCV: 94 fL (ref 80–100)
Monocyte #: 0.6 x10 3/mm (ref 0.2–0.9)
Monocyte %: 7.8 %
Neutrophil #: 3.4 x10 3/mm (ref 1.4–6.5)
Neutrophil %: 47.4 %
Platelet: 279 x10 3/mm (ref 150–440)
RBC: 3.93 10*6/uL (ref 3.80–5.20)
RDW: 14.8 % — ABNORMAL HIGH (ref 11.5–14.5)
WBC: 7.2 x10 3/mm (ref 3.6–11.0)

## 2013-03-28 LAB — BASIC METABOLIC PANEL
Anion Gap: 10 (ref 7–16)
BUN: 15 mg/dL (ref 7–18)
Calcium, Total: 8.8 mg/dL (ref 8.5–10.1)
Chloride: 101 mmol/L (ref 98–107)
Co2: 27 mmol/L (ref 21–32)
EGFR (African American): >60
EGFR (Non-African Amer.): 54 — ABNORMAL LOW

## 2013-04-04 LAB — CBC CANCER CENTER
Basophil #: 0.1 x10 3/mm (ref 0.0–0.1)
Basophil %: 0.9 %
Eosinophil %: 19.6 %
Lymphocyte #: 1.5 x10 3/mm (ref 1.0–3.6)
MCHC: 34.7 g/dL (ref 32.0–36.0)
MCV: 93 fL (ref 80–100)
Monocyte %: 6.8 %
Neutrophil %: 47.9 %
RBC: 3.66 10*6/uL — ABNORMAL LOW (ref 3.80–5.20)

## 2013-04-11 LAB — BASIC METABOLIC PANEL
BUN: 18 mg/dL (ref 7–18)
Calcium, Total: 8.5 mg/dL (ref 8.5–10.1)
Chloride: 104 mmol/L (ref 98–107)
Co2: 26 mmol/L (ref 21–32)
Creatinine: 0.98 mg/dL (ref 0.60–1.30)
EGFR (African American): >60
EGFR (Non-African Amer.): >60
Glucose: 89 mg/dL (ref 65–99)
Osmolality: 277 (ref 275–301)
Potassium: 4 mmol/L (ref 3.5–5.1)

## 2013-04-11 LAB — CBC CANCER CENTER
Basophil #: 0.1 x10 3/mm (ref 0.0–0.1)
Basophil %: 1.4 %
Eosinophil #: 0.8 x10 3/mm — ABNORMAL HIGH (ref 0.0–0.7)
HGB: 11.4 g/dL — ABNORMAL LOW (ref 12.0–16.0)
Lymphocyte #: 1.7 x10 3/mm (ref 1.0–3.6)
Lymphocyte %: 31.7 %
MCHC: 33.8 g/dL (ref 32.0–36.0)
Monocyte %: 9.8 %
Neutrophil #: 2.3 x10 3/mm (ref 1.4–6.5)
RBC: 3.57 10*6/uL — ABNORMAL LOW (ref 3.80–5.20)
RDW: 15.3 % — ABNORMAL HIGH (ref 11.5–14.5)
WBC: 5.5 x10 3/mm (ref 3.6–11.0)

## 2013-04-18 LAB — BASIC METABOLIC PANEL
Anion Gap: 10 (ref 7–16)
Chloride: 104 mmol/L (ref 98–107)
Creatinine: 1.1 mg/dL (ref 0.60–1.30)
Glucose: 113 mg/dL — ABNORMAL HIGH (ref 65–99)
Potassium: 4 mmol/L (ref 3.5–5.1)

## 2013-04-18 LAB — CBC CANCER CENTER
Basophil #: 0 x10 3/mm (ref 0.0–0.1)
Basophil %: 0.6 %
Eosinophil #: 0.5 x10 3/mm (ref 0.0–0.7)
Eosinophil %: 9.8 %
HCT: 35.4 % (ref 35.0–47.0)
Lymphocyte #: 1.8 x10 3/mm (ref 1.0–3.6)
MCHC: 33.9 g/dL (ref 32.0–36.0)
Monocyte #: 0.5 x10 3/mm (ref 0.2–0.9)
Neutrophil #: 2.6 x10 3/mm (ref 1.4–6.5)
Neutrophil %: 48 %
RBC: 3.75 10*6/uL — ABNORMAL LOW (ref 3.80–5.20)
RDW: 15.9 % — ABNORMAL HIGH (ref 11.5–14.5)
WBC: 5.3 x10 3/mm (ref 3.6–11.0)

## 2013-04-22 ENCOUNTER — Ambulatory Visit: Payer: Self-pay | Admitting: Hematology and Oncology

## 2013-04-25 LAB — BASIC METABOLIC PANEL
Anion Gap: 9 (ref 7–16)
Calcium, Total: 9.2 mg/dL (ref 8.5–10.1)
Chloride: 104 mmol/L (ref 98–107)
Creatinine: 1.01 mg/dL (ref 0.60–1.30)
EGFR (African American): >60
EGFR (Non-African Amer.): 60 — ABNORMAL LOW
Glucose: 86 mg/dL (ref 65–99)
Osmolality: 279 (ref 275–301)
Potassium: 4.1 mmol/L (ref 3.5–5.1)
Sodium: 139 mmol/L (ref 136–145)

## 2013-04-25 LAB — CBC CANCER CENTER
Basophil #: 0.1 x10 3/mm (ref 0.0–0.1)
Eosinophil #: 0.5 x10 3/mm (ref 0.0–0.7)
Eosinophil %: 10.5 %
HCT: 35.6 % (ref 35.0–47.0)
HGB: 11.6 g/dL — ABNORMAL LOW (ref 12.0–16.0)
Lymphocyte #: 1.8 x10 3/mm (ref 1.0–3.6)
Lymphocyte %: 37.9 %
MCH: 31.3 pg (ref 26.0–34.0)
Monocyte %: 9.5 %
Neutrophil %: 40.9 %
Platelet: 273 x10 3/mm (ref 150–440)
RBC: 3.72 10*6/uL — ABNORMAL LOW (ref 3.80–5.20)

## 2013-04-27 ENCOUNTER — Other Ambulatory Visit: Payer: Self-pay

## 2013-05-02 LAB — BASIC METABOLIC PANEL
Calcium, Total: 9 mg/dL (ref 8.5–10.1)
Creatinine: 1.1 mg/dL (ref 0.60–1.30)
EGFR (African American): >60
EGFR (Non-African Amer.): 54 — ABNORMAL LOW
Glucose: 95 mg/dL (ref 65–99)
Osmolality: 281 (ref 275–301)
Sodium: 139 mmol/L (ref 136–145)

## 2013-05-02 LAB — CBC CANCER CENTER
Basophil #: 0.1 x10 3/mm (ref 0.0–0.1)
Eosinophil #: 0.3 x10 3/mm (ref 0.0–0.7)
HCT: 35 % (ref 35.0–47.0)
MCHC: 33.1 g/dL (ref 32.0–36.0)
Monocyte #: 0.4 x10 3/mm (ref 0.2–0.9)
Neutrophil %: 48.1 %
Platelet: 263 x10 3/mm (ref 150–440)

## 2013-05-09 LAB — BASIC METABOLIC PANEL
BUN: 22 mg/dL — ABNORMAL HIGH (ref 7–18)
Calcium, Total: 9.2 mg/dL (ref 8.5–10.1)
Chloride: 105 mmol/L (ref 98–107)
Co2: 25 mmol/L (ref 21–32)
Creatinine: 1.03 mg/dL (ref 0.60–1.30)
EGFR (African American): >60
Glucose: 88 mg/dL (ref 65–99)
Osmolality: 280 (ref 275–301)
Potassium: 4 mmol/L (ref 3.5–5.1)

## 2013-05-09 LAB — CBC CANCER CENTER
Basophil #: 0.1 x10 3/mm (ref 0.0–0.1)
HCT: 36.2 % (ref 35.0–47.0)
Lymphocyte %: 37.6 %
MCH: 32 pg (ref 26.0–34.0)
MCHC: 33.4 g/dL (ref 32.0–36.0)
MCV: 96 fL (ref 80–100)
Monocyte %: 9.8 %
Platelet: 276 x10 3/mm (ref 150–440)
RDW: 15.2 % — ABNORMAL HIGH (ref 11.5–14.5)
WBC: 5.5 x10 3/mm (ref 3.6–11.0)

## 2013-05-16 LAB — BASIC METABOLIC PANEL
Anion Gap: 9 (ref 7–16)
BUN: 15 mg/dL (ref 7–18)
Calcium, Total: 9.3 mg/dL (ref 8.5–10.1)
Chloride: 105 mmol/L (ref 98–107)
Co2: 26 mmol/L (ref 21–32)
Creatinine: 1.03 mg/dL (ref 0.60–1.30)
EGFR (Non-African Amer.): 58 — ABNORMAL LOW
Glucose: 102 mg/dL — ABNORMAL HIGH (ref 65–99)
Osmolality: 280 (ref 275–301)
Potassium: 4 mmol/L (ref 3.5–5.1)

## 2013-05-16 LAB — CBC CANCER CENTER
Basophil %: 1.2 %
Eosinophil %: 4.7 %
HCT: 35.6 % (ref 35.0–47.0)
HGB: 11.8 g/dL — ABNORMAL LOW (ref 12.0–16.0)
Lymphocyte #: 2 x10 3/mm (ref 1.0–3.6)
MCHC: 33.1 g/dL (ref 32.0–36.0)
MCV: 96 fL (ref 80–100)
Monocyte #: 0.4 x10 3/mm (ref 0.2–0.9)
Monocyte %: 8.1 %
Platelet: 257 x10 3/mm (ref 150–440)
RBC: 3.7 10*6/uL — ABNORMAL LOW (ref 3.80–5.20)

## 2013-05-22 ENCOUNTER — Ambulatory Visit: Payer: Self-pay | Admitting: Hematology and Oncology

## 2013-05-23 LAB — CBC CANCER CENTER
Basophil #: 0.1 x10 3/mm (ref 0.0–0.1)
Basophil %: 1.5 %
Eosinophil #: 0.2 x10 3/mm (ref 0.0–0.7)
Eosinophil %: 4.3 %
HCT: 35.9 % (ref 35.0–47.0)
Lymphocyte #: 2 x10 3/mm (ref 1.0–3.6)
MCHC: 33.1 g/dL (ref 32.0–36.0)
RDW: 15.5 % — ABNORMAL HIGH (ref 11.5–14.5)
WBC: 5.3 x10 3/mm (ref 3.6–11.0)

## 2013-05-23 LAB — BASIC METABOLIC PANEL
Anion Gap: 9 (ref 7–16)
Calcium, Total: 9.3 mg/dL (ref 8.5–10.1)
Chloride: 106 mmol/L (ref 98–107)
Co2: 24 mmol/L (ref 21–32)
Creatinine: 0.87 mg/dL (ref 0.60–1.30)
EGFR (African American): >60
EGFR (Non-African Amer.): >60
Glucose: 102 mg/dL — ABNORMAL HIGH (ref 65–99)
Potassium: 3.9 mmol/L (ref 3.5–5.1)

## 2013-05-30 LAB — BASIC METABOLIC PANEL
Anion Gap: 8 (ref 7–16)
BUN: 18 mg/dL (ref 7–18)
Calcium, Total: 9.1 mg/dL (ref 8.5–10.1)
Chloride: 105 mmol/L (ref 98–107)
Co2: 26 mmol/L (ref 21–32)
Creatinine: 0.99 mg/dL (ref 0.60–1.30)
EGFR (African American): >60
EGFR (Non-African Amer.): >60
Sodium: 139 mmol/L (ref 136–145)

## 2013-05-30 LAB — CBC CANCER CENTER
Basophil #: 0.1 x10 3/mm (ref 0.0–0.1)
Basophil %: 1.2 %
Eosinophil #: 0.3 x10 3/mm (ref 0.0–0.7)
Eosinophil %: 5 %
HCT: 36 % (ref 35.0–47.0)
Lymphocyte #: 2 x10 3/mm (ref 1.0–3.6)
MCH: 31.8 pg (ref 26.0–34.0)
MCHC: 32.9 g/dL (ref 32.0–36.0)
MCV: 97 fL (ref 80–100)
Monocyte #: 0.6 x10 3/mm (ref 0.2–0.9)
Monocyte %: 10.6 %
RBC: 3.73 10*6/uL — ABNORMAL LOW (ref 3.80–5.20)
RDW: 15.6 % — ABNORMAL HIGH (ref 11.5–14.5)
WBC: 5.3 x10 3/mm (ref 3.6–11.0)

## 2013-06-06 LAB — CBC CANCER CENTER
Eosinophil #: 0.2 x10 3/mm (ref 0.0–0.7)
Eosinophil %: 3.3 %
HCT: 36.2 % (ref 35.0–47.0)
HGB: 11.6 g/dL — ABNORMAL LOW (ref 12.0–16.0)
Lymphocyte #: 3 x10 3/mm (ref 1.0–3.6)
MCH: 31.1 pg (ref 26.0–34.0)
MCHC: 32 g/dL (ref 32.0–36.0)
Monocyte #: 0.6 x10 3/mm (ref 0.2–0.9)
RBC: 3.73 10*6/uL — ABNORMAL LOW (ref 3.80–5.20)
RDW: 15.4 % — ABNORMAL HIGH (ref 11.5–14.5)
WBC: 6.6 x10 3/mm (ref 3.6–11.0)

## 2013-06-06 LAB — BASIC METABOLIC PANEL
BUN: 16 mg/dL (ref 7–18)
Creatinine: 0.99 mg/dL (ref 0.60–1.30)
Glucose: 90 mg/dL (ref 65–99)
Osmolality: 276 (ref 275–301)

## 2013-06-22 ENCOUNTER — Ambulatory Visit: Payer: Self-pay | Admitting: Hematology and Oncology

## 2013-06-27 ENCOUNTER — Ambulatory Visit (INDEPENDENT_AMBULATORY_CARE_PROVIDER_SITE_OTHER): Payer: Medicare Other | Admitting: General Surgery

## 2013-06-27 ENCOUNTER — Encounter: Payer: Self-pay | Admitting: General Surgery

## 2013-06-27 ENCOUNTER — Other Ambulatory Visit: Payer: Self-pay | Admitting: General Surgery

## 2013-06-27 ENCOUNTER — Other Ambulatory Visit: Payer: Medicare Other

## 2013-06-27 VITALS — BP 150/82 | HR 76 | Resp 14 | Ht 63.0 in | Wt 175.0 lb

## 2013-06-27 DIAGNOSIS — C50919 Malignant neoplasm of unspecified site of unspecified female breast: Secondary | ICD-10-CM | POA: Insufficient documentation

## 2013-06-27 DIAGNOSIS — N63 Unspecified lump in unspecified breast: Secondary | ICD-10-CM

## 2013-06-27 DIAGNOSIS — C50419 Malignant neoplasm of upper-outer quadrant of unspecified female breast: Secondary | ICD-10-CM | POA: Insufficient documentation

## 2013-06-27 NOTE — Patient Instructions (Addendum)
Lumpectomy A lumpectomy is a form of "breast conserving" or "breast preservation" surgery. It may also be referred to as a partial mastectomy. During a lumpectomy, the portion of the breast that contains the cancerous tumor or breast mass (the lump) is removed. Some normal tissue around the lump may also be removed to make sure all the tumor has been removed. This surgery should take 40 minutes or less. LET [facility] CARE PROVIDER KNOW ABOUT:  Any allergies you have.  All medicines you are taking, including vitamins, herbs, eye drops, creams, and over-the-counter medicines.  Previous problems you or members of your family have had with the use of anesthetics.  Any blood disorders you have.  Previous surgeries you have had.  Medical conditions you have. RISKS AND COMPLICATIONS Generally, this is a safe procedure. However, as with any procedure, complications can occur. Possible complications include:  Bleeding.  Infection.  Pain.  Temporary swelling.  Change in the shape of the breast, particularly if a large portion is removed. BEFORE THE PROCEDURE  Ask your health care provider about changing or stopping your regular medicines.  Do not eat or drink anything for 7 8 hours before the surgery or as directed by your health care provider. Ask your health care provider if you can take a sip of water with any approved medicines.  On the day of surgery, your healthcare provider will use a mammogram or ultrasound to locate and mark the tumor in your breast. These markings on your breast will show where the cut (incision) will be made. PROCEDURE   An IV tube will be put into one of your veins.  You may be given medicine to help you relax before the surgery (sedative). You will be given one of the following:  A medicine that numbs the area (local anesthesia).  A medicine that makes you go to sleep (general anesthesia).  Your health care provider will use a kind of electric scalpel  that uses heat to minimize bleeding (electrocautery knife).  A curved incision (like a smile or frown) that follows the natural curve of your breast is made, to allow for minimal scarring and better healing.  The tumor will be removed with some of the surrounding tissue. This will be sent to the lab for analysis. Your health care provider may also remove your lymph nodes at this time if needed.  Sometimes, but not always, a rubber tube called a drain will be surgically inserted into your breast area or armpit to collect excess fluid that may accumulate in the space where the tumor was. This drain is connected to a plastic bulb on the outside of your body. This drain creates suction to help remove the fluid.  The incisions will be closed with stitches (sutures).  A bandage may be placed over the incisions. AFTER THE PROCEDURE  You will be taken to the recovery area.  You will be given medicine for pain.  A small rubber drain may be placed in the breast for 2 3 days to prevent a collection of blood (hematoma) from developing in the breast. You will be given instructions on caring for the drain before you go home.  A pressure bandage (dressing) will be applied for 1 2 days to prevent bleeding. Ask your health care provider how to care for your bandage at home. Document Released: 07/20/2006 Document Revised: 02/08/2013 Document Reviewed: 11/11/2012 [facility] Patient Information 2014 [place].  Patient's surgery has been scheduled for 07-03-13 at [facility]. It is okay  for patient to continue 81 mg aspirin.

## 2013-06-27 NOTE — Progress Notes (Signed)
Patient ID: [name], female   DOB: [birth year], 63 y.o.   MRN: [id number]  Chief Complaint  Patient presents with  . Breast Problem    evaluate for lumpectomy    HPI [name] is a 63 y.o. female.  Here today for evaluation of a lumpectomy.  She has a known history of breast cancer.Patient states no new problems at this time. Patient last treatment was 06/06/13.  Medical oncology reported this was a HER-2/neu not overexpressing tumor. This is incorrect. HPI  Past Medical History  Diagnosis Date  . Allergy   . Hepatitis A   . Cancer     uterine   . 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 ER+; PR -; HER-2/neu overexpression    Past Surgical History  Procedure Laterality Date  . Abdominal hysterectomy  1979  . Back surgery    . Colonoscopy  2014  . Breast biopsy Left 2014    Family History  Problem Relation Age of Onset  . Breast cancer Mother     Social History History  Substance Use Topics  . Smoking status: Never Smoker   . Smokeless tobacco: Never Used  . Alcohol Use: No    Allergies  Allergen Reactions  . Codeine     sick  . Sulfa Antibiotics Rash    Current Outpatient Prescriptions  Medication Sig Dispense Refill  . aspirin 81 MG tablet Take 81 mg by mouth daily.      Marland Kitchen FIBER SELECT GUMMIES PO Take 1 tablet by mouth as needed.      . minocycline (DYNACIN) 50 MG tablet Take 50 mg by mouth 2 (two) times daily.      . Multiple Vitamins-Minerals (CENTRUM PO) Take by mouth.      . pantoprazole (PROTONIX) 40 MG tablet Take 40 mg by mouth daily.      . Pseudoephedrine HCl (SINUS & ALLERGY 12 HOUR PO) Take 1 tablet by mouth.       No current facility-administered medications for this visit.    Review of Systems Review of Systems  Constitutional: Negative.   Respiratory: Negative.   Cardiovascular: Negative.     Blood pressure 150/82, pulse 76, resp. rate 14, height '5\' 3"'  (1.6 m), weight 175 lb  (79.379 kg).  Physical Exam Physical Exam  Constitutional: She is oriented to person, place, and time. She appears well-developed and well-nourished.  Eyes: Conjunctivae are normal. No scleral icterus.  Neck: Neck supple. No thyromegaly present.  Cardiovascular: Normal rate, regular rhythm and normal heart sounds.   Extra beats when sitting up . Laying down was normal beats.   Pulmonary/Chest: Breath sounds normal. Right breast exhibits no inverted nipple, no mass, no nipple discharge, no skin change and no tenderness. Left breast exhibits no inverted nipple, no nipple discharge and no tenderness.  Lymphadenopathy:    She has no cervical adenopathy.    She has no axillary adenopathy.  Neurological: She is alert and oriented to person, place, and time.  Skin: Skin is warm and dry.    Data Reviewed Laboratory studies dated June 06, 2013 showed hemoglobin 1.6, white blood cell count 6600 and an MCV of 97. Platelet count normal at 272,000. Basic metabolic panel showed a normal creatinine 1.0. Estimated GFR greater than 60. Ultrasound examination of the left breast at the 3:00 position showed a near lenticular shape 0.5 x 0.8 x 0.9 cm hypoechoic mass with faint acoustic shadowing in  the area of her previously biopsied breast cancer. Examination of the left axilla in its inferior aspect, 5 cm superior to a line drawn across the nipple and 10 cm from the nipple a vague 0.3 x 0.4 x 0.6 cm elliptical structure suggestive of lymph node was identified. The previously noted large 1.5 cm lymph node was no longer evident.  Assessment    Left breast cancer status post neoadjuvant chemotherapy with decreased size of both primary and axillary tumor deposits.    Left: T1,N1, M0. ER+; PR -; HER-2/neu overexpression      Plan    The patient is desirous of breast conservation if possible. She has a modest breast volume. She is amenable to mastectomy if needed to achieve clear margins and achieve  acceptable cosmetic result.  With the significant decrease in size of the axillary node, sentinel node biopsy will be completed and if negative axillary dissection will be deferred. (no biopsy clip was placed in the lymph node at the time of core biopsy in December 08, 2012).  Gen. Risks associated with surgery including those related to bleeding and infection were discussed.    Patient's surgery has been scheduled for 07-03-13 at [facility]. It is okay for patient to continue 81 mg aspirin.   Robert Bellow 06/27/2013, 7:18 PM

## 2013-06-28 ENCOUNTER — Other Ambulatory Visit: Payer: Self-pay | Admitting: General Surgery

## 2013-06-28 DIAGNOSIS — C50919 Malignant neoplasm of unspecified site of unspecified female breast: Secondary | ICD-10-CM

## 2013-07-03 ENCOUNTER — Ambulatory Visit: Payer: Self-pay | Admitting: General Surgery

## 2013-07-03 DIAGNOSIS — C50419 Malignant neoplasm of upper-outer quadrant of unspecified female breast: Secondary | ICD-10-CM

## 2013-07-04 ENCOUNTER — Encounter: Payer: Self-pay | Admitting: General Surgery

## 2013-07-06 ENCOUNTER — Telehealth: Payer: Self-pay | Admitting: General Surgery

## 2013-07-06 LAB — PATHOLOGY REPORT

## 2013-07-06 NOTE — Telephone Encounter (Signed)
The patient was notified of the pathology report on the wide excision specimen and the sentinel node was negative.  She is aware that the biopsy clip placed at the time of her pre-chemotherapy treatments was not identified in the resected specimen, and that a post surgery mammogram is required to confirm that no clip remains (which would necessitate reexcision).  She reports she is taken off her compressive wrap and is doing well.  Follow up as scheduled on 07/10/2013.

## 2013-07-07 ENCOUNTER — Encounter: Payer: Self-pay | Admitting: General Surgery

## 2013-07-10 ENCOUNTER — Ambulatory Visit (INDEPENDENT_AMBULATORY_CARE_PROVIDER_SITE_OTHER): Payer: Medicare Other | Admitting: General Surgery

## 2013-07-10 ENCOUNTER — Telehealth: Payer: Self-pay | Admitting: *Deleted

## 2013-07-10 ENCOUNTER — Encounter: Payer: Self-pay | Admitting: General Surgery

## 2013-07-10 VITALS — BP 136/74 | HR 80 | Resp 16 | Ht 63.0 in | Wt 174.0 lb

## 2013-07-10 DIAGNOSIS — C50919 Malignant neoplasm of unspecified site of unspecified female breast: Secondary | ICD-10-CM

## 2013-07-10 NOTE — Telephone Encounter (Signed)
Patient's mammogram has been changed from 08-01-13 at 1 pm to 11 am per Roselyn Reef at [facility]. Patient notified as instructed. She verbalizes understanding.

## 2013-07-10 NOTE — Progress Notes (Signed)
Patient ID: [name], female   DOB: 02-18-1951, 63 y.o.   MRN: [id number]  Chief Complaint  Patient presents with  . Routine Post Op    left breast wide excision    HPI [name] is a 63 y.o. female here for her post op left breast wide excision done 07/03/13. Patient states she is doing well and not on any pain medications.  The metallic clip placed at the time of her biopsy, prior to neoadjuvant chemotherapy was not identified in the resected specimen. HPI  Past Medical History  Diagnosis Date  . Allergy   . Hepatitis A   . Cancer     uterine   . 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 ER+; PR -; HER-2/neu overexpression    Past Surgical History  Procedure Laterality Date  . Abdominal hysterectomy  1979  . Back surgery    . Colonoscopy  2014  . Breast biopsy Left 2014    Family History  Problem Relation Age of Onset  . Breast cancer Mother     Social History History  Substance Use Topics  . Smoking status: Never Smoker   . Smokeless tobacco: Never Used  . Alcohol Use: No    Allergies  Allergen Reactions  . Codeine     sick  . Sulfa Antibiotics Rash    Current Outpatient Prescriptions  Medication Sig Dispense Refill  . aspirin 81 MG tablet Take 81 mg by mouth daily.      Marland Kitchen FIBER SELECT GUMMIES PO Take 1 tablet by mouth as needed.      . minocycline (DYNACIN) 50 MG tablet Take 50 mg by mouth 2 (two) times daily.      . Multiple Vitamins-Minerals (CENTRUM PO) Take by mouth.      . pantoprazole (PROTONIX) 40 MG tablet Take 40 mg by mouth daily.      . Pseudoephedrine HCl (SINUS & ALLERGY 12 HOUR PO) Take 1 tablet by mouth.       No current facility-administered medications for this visit.    Review of Systems Review of Systems  Constitutional: Negative.   Respiratory: Negative.   Cardiovascular: Negative.     Blood pressure 136/74, pulse 80, resp. rate 16, height '5\' 3"'  (1.6 m), weight 174 lb  (78.926 kg).  Physical Exam Physical Exam  Constitutional: She is oriented to person, place, and time. She appears well-developed and well-nourished.  Eyes: Conjunctivae are normal.  Neck: Neck supple.  Cardiovascular: Normal rate, regular rhythm and normal heart sounds.   Pulmonary/Chest: Effort normal and breath sounds normal.  Left breast incision looks clean and well healed .   Lymphadenopathy:    She has no cervical adenopathy.    She has no axillary adenopathy.  Neurological: She is alert and oriented to person, place, and time.  Skin: Skin is warm and dry.    Data Reviewed No evidence of residual tumor in the breast or axillary node.  Assessment    Possible complete pathologic response.     Plan    The patient had been informed at time of surgery that the biopsy clip was not in the specimen, and the post surgical mammography will be necessary to confirm that the area of concern has been removed. Based on anatomic review of the area and the volume of tissue resected, I suspect that the area of concern has been removed. If the clip was identified, a repeat resection  of this area will be necessary. Due to her local discomfort at present, we'll defer this for a few weeks.     Patient has been scheduled for a unilateral left breast diagnostic mammogram at [facility] for 08-01-13 at 1 pm. She is aware of date, time, and instructions.   This patient will follow up in the office in one month after mammogram is completed.   Robert Bellow 07/10/2013, 9:16 PM

## 2013-07-10 NOTE — Patient Instructions (Addendum)
Follow up in one month left breast mammogram.

## 2013-07-13 ENCOUNTER — Ambulatory Visit: Payer: Self-pay | Admitting: Hematology and Oncology

## 2013-07-23 ENCOUNTER — Ambulatory Visit: Payer: Self-pay | Admitting: Hematology and Oncology

## 2013-08-01 ENCOUNTER — Encounter: Payer: Self-pay | Admitting: General Surgery

## 2013-08-01 ENCOUNTER — Ambulatory Visit: Payer: Self-pay | Admitting: General Surgery

## 2013-08-01 ENCOUNTER — Ambulatory Visit: Payer: Self-pay | Admitting: Hematology and Oncology

## 2013-08-01 LAB — CBC CANCER CENTER
Basophil #: 0.1 x10 3/mm (ref 0.0–0.1)
Basophil %: 1.2 %
Eosinophil #: 0.4 x10 3/mm (ref 0.0–0.7)
Eosinophil %: 6.2 %
HCT: 40 % (ref 35.0–47.0)
HGB: 12.9 g/dL (ref 12.0–16.0)
Lymphocyte #: 2.8 x10 3/mm (ref 1.0–3.6)
Lymphocyte %: 41.5 %
MCH: 30.2 pg (ref 26.0–34.0)
MCHC: 32.2 g/dL (ref 32.0–36.0)
MCV: 94 fL (ref 80–100)
Monocyte #: 0.6 x10 3/mm (ref 0.2–0.9)
Monocyte %: 8.9 %
NEUTROS ABS: 2.8 x10 3/mm (ref 1.4–6.5)
NEUTROS PCT: 42.2 %
Platelet: 280 x10 3/mm (ref 150–440)
RBC: 4.26 10*6/uL (ref 3.80–5.20)
RDW: 12.7 % (ref 11.5–14.5)
WBC: 6.6 x10 3/mm (ref 3.6–11.0)

## 2013-08-01 LAB — BASIC METABOLIC PANEL
Anion Gap: 11 (ref 7–16)
BUN: 16 mg/dL (ref 7–18)
Calcium, Total: 9.3 mg/dL (ref 8.5–10.1)
Chloride: 102 mmol/L (ref 98–107)
Co2: 27 mmol/L (ref 21–32)
Creatinine: 1.18 mg/dL (ref 0.60–1.30)
EGFR (African American): 57 — ABNORMAL LOW
EGFR (Non-African Amer.): 49 — ABNORMAL LOW
GLUCOSE: 138 mg/dL — AB (ref 65–99)
Osmolality: 283 (ref 275–301)
Potassium: 4 mmol/L (ref 3.5–5.1)
Sodium: 140 mmol/L (ref 136–145)

## 2013-08-08 ENCOUNTER — Ambulatory Visit: Payer: Medicare Other | Admitting: General Surgery

## 2013-08-10 ENCOUNTER — Other Ambulatory Visit: Payer: Self-pay | Admitting: General Surgery

## 2013-08-10 ENCOUNTER — Ambulatory Visit (INDEPENDENT_AMBULATORY_CARE_PROVIDER_SITE_OTHER): Payer: Medicare Other | Admitting: General Surgery

## 2013-08-10 ENCOUNTER — Encounter: Payer: Self-pay | Admitting: General Surgery

## 2013-08-10 VITALS — BP 158/76 | HR 74 | Resp 12 | Ht 63.0 in | Wt 177.0 lb

## 2013-08-10 DIAGNOSIS — C50919 Malignant neoplasm of unspecified site of unspecified female breast: Secondary | ICD-10-CM

## 2013-08-10 NOTE — Progress Notes (Addendum)
Patient ID: [name], female   DOB: 08/15/1950, 63 y.o.   MRN: [id number]  Chief Complaint  Patient presents with  . Follow-up    left diagnostic mammogram     HPI [name] is a 63 y.o. female who presents for a breast cancer evaluation. The most recent mammogram was done on 08/01/13. The patient does self breast checks and gets regular mammograms done. No new breast problems at this time.   HPI  Past Medical History  Diagnosis Date  . Allergy   . Hepatitis A   . Cancer     uterine   . 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 ER+; PR -; HER-2/neu overexpression    Past Surgical History  Procedure Laterality Date  . Abdominal hysterectomy  1979  . Back surgery    . Colonoscopy  2014  . Breast biopsy Left 2014    Family History  Problem Relation Age of Onset  . Breast cancer Mother     Social History History  Substance Use Topics  . Smoking status: Never Smoker   . Smokeless tobacco: Never Used  . Alcohol Use: No    Allergies  Allergen Reactions  . Codeine     sick  . Sulfa Antibiotics Rash    Current Outpatient Prescriptions  Medication Sig Dispense Refill  . aspirin 81 MG tablet Take 81 mg by mouth daily.      Marland Kitchen FIBER SELECT GUMMIES PO Take 1 tablet by mouth as needed.      . minocycline (DYNACIN) 50 MG tablet Take 50 mg by mouth 2 (two) times daily.      . Multiple Vitamins-Minerals (CENTRUM PO) Take by mouth.      . pantoprazole (PROTONIX) 40 MG tablet Take 40 mg by mouth daily.      . Pseudoephedrine HCl (SINUS & ALLERGY 12 HOUR PO) Take 1 tablet by mouth.       No current facility-administered medications for this visit.    Review of Systems Review of Systems  Constitutional: Negative.   Respiratory: Negative.   Cardiovascular: Negative.     Blood pressure 158/76, pulse 74, resp. rate 12, height '5\' 3"'  (1.6 m), weight 177 lb (80.287 kg).  Physical Exam Physical Exam  Constitutional:  She is oriented to person, place, and time. She appears well-developed and well-nourished.  Neck: Neck supple. No thyromegaly present.  Cardiovascular: Normal rate, regular rhythm and normal heart sounds.   No murmur heard. Pulmonary/Chest: Effort normal and breath sounds normal. Right breast exhibits no inverted nipple, no mass, no nipple discharge, no skin change and no tenderness. Left breast exhibits no inverted nipple, no mass, no nipple discharge, no skin change and no tenderness.    Lymphadenopathy:    She has no cervical adenopathy.    She has no axillary adenopathy.  Neurological: She is alert and oriented to person, place, and time.  Skin: Skin is warm and dry.    Data Reviewed Left breast mammogram completed post surgery on 08/01/2013 shows the previously placed biopsy clip with minimal adjacent architectural distortion. No associated mass. [id number].  Pathology showed no evidence of tumor in the tissue removed and the negative sentinel node.    Assessment    T1 N1 carcinoma of the left breast, pathologic complete response on present tissue sampling.      Plan    Considering the minimal residual tissue around the original biopsy clip, it's likely  that there is indeed a complete pathologic response. I think it would be important to confirm this by needle localization and excision of the previously placed biopsy clip.  This will be scheduled convenient date as an outpatient procedure.    Patient's surgery has been scheduled for 08-16-13 at [facility].    Robert Bellow 08/10/2013, 1:11 PM

## 2013-08-10 NOTE — Patient Instructions (Signed)
Patient's surgery has been scheduled for 08-16-13 at [facility].

## 2013-08-16 ENCOUNTER — Ambulatory Visit: Payer: Self-pay | Admitting: General Surgery

## 2013-08-16 DIAGNOSIS — C50219 Malignant neoplasm of upper-inner quadrant of unspecified female breast: Secondary | ICD-10-CM

## 2013-08-16 HISTORY — PX: BREAST SURGERY: SHX581

## 2013-08-20 ENCOUNTER — Ambulatory Visit: Payer: Self-pay | Admitting: Hematology and Oncology

## 2013-08-20 ENCOUNTER — Encounter: Payer: Self-pay | Admitting: General Surgery

## 2013-08-23 ENCOUNTER — Ambulatory Visit (INDEPENDENT_AMBULATORY_CARE_PROVIDER_SITE_OTHER): Payer: Medicare Other | Admitting: General Surgery

## 2013-08-23 ENCOUNTER — Encounter: Payer: Self-pay | Admitting: General Surgery

## 2013-08-23 VITALS — BP 120/68 | HR 70 | Resp 12 | Ht 63.0 in | Wt 177.0 lb

## 2013-08-23 DIAGNOSIS — C50919 Malignant neoplasm of unspecified site of unspecified female breast: Secondary | ICD-10-CM

## 2013-08-23 NOTE — Progress Notes (Signed)
Patient ID: [name], female   DOB: 03-14-1951, 63 y.o.   MRN: [id number]  Chief Complaint  Patient presents with  . Routine Post Op    HPI [name] is a 63 y.o. female.  Here today for her postoperative visit.  She had needle/wire localization of left breast done 08-16-13 to extract the previously placed biopsy clip. Examination of this tissue showed postoperative changes and no evidence of residual malignancy. This makes her a pathologic complete response to neoadjuvant chemotherapy. She completed chemotherapy in December.  Dr. Baruch Gouty appointments for Thursday to discuss radiation therapy for left breast cancer.  She states she is getting a long well occasional pain control by tylenol.  HPI  Past Medical History  Diagnosis Date  . Allergy   . Hepatitis A   . Cancer     uterine   . 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 ER+; PR -; HER-2/neu overexpression    Past Surgical History  Procedure Laterality Date  . Abdominal hysterectomy  1979  . Back surgery    . Colonoscopy  2014  . Breast biopsy Left 2014  . Breast surgery Left 08-16-13    Needle/wire localization of Left breast     Family History  Problem Relation Age of Onset  . Breast cancer Mother     Social History History  Substance Use Topics  . Smoking status: Never Smoker   . Smokeless tobacco: Never Used  . Alcohol Use: No    Allergies  Allergen Reactions  . Codeine     sick  . Sulfa Antibiotics Rash    Current Outpatient Prescriptions  Medication Sig Dispense Refill  . aspirin 81 MG tablet Take 81 mg by mouth daily.      Marland Kitchen FIBER SELECT GUMMIES PO Take 1 tablet by mouth as needed.      . Multiple Vitamins-Minerals (CENTRUM PO) Take by mouth.      . pantoprazole (PROTONIX) 40 MG tablet Take 40 mg by mouth daily.      . Pseudoephedrine HCl (SINUS & ALLERGY 12 HOUR PO) Take 1 tablet by mouth as needed.        No current facility-administered  medications for this visit.    Review of Systems Review of Systems  Constitutional: Negative.   Respiratory: Negative.   Cardiovascular: Negative.     Blood pressure 120/68, pulse 70, resp. rate 12, height _0  (1.6 m), weight 177 lb (80.287 kg).  Physical Exam Physical Exam  Constitutional: She is oriented to person, place, and time. She appears well-developed and well-nourished.  Pulmonary/Chest:  Incision well healed left breast, with good range of motion to upper arms.  Neurological: She is alert and oriented to person, place, and time.  Skin: Skin is warm and dry.    Data Reviewed 08/16/2013 excision results showed no residual malignancy.  Assessment    Doing well status post breast conservation with neoadjuvant chemotherapy, complete pathologic response with neoadjuvant treatment.    Plan    The patient is scheduled to me with radiation oncology tomorrow. She would be a candidate for whole breast radiation based on the previous wide excision and sentinel node biopsy.  Antiestrogen therapy will be initiated by the medical oncology staff.  The patient was asked to return in 2 months for reassessment.       [name] 08/23/2013, 9:37 AM

## 2013-08-23 NOTE — Patient Instructions (Signed)
The patient is aware to call back for any questions or concerns.  

## 2013-08-28 LAB — PATHOLOGY REPORT

## 2013-09-12 LAB — CBC CANCER CENTER
Basophil #: 0.1 x10 3/mm (ref 0.0–0.1)
Basophil %: 0.9 %
Eosinophil #: 0.3 x10 3/mm (ref 0.0–0.7)
Eosinophil %: 3.4 %
HCT: 39.3 % (ref 35.0–47.0)
HGB: 12.6 g/dL (ref 12.0–16.0)
LYMPHS ABS: 2.8 x10 3/mm (ref 1.0–3.6)
Lymphocyte %: 34.6 %
MCH: 29.6 pg (ref 26.0–34.0)
MCHC: 32.1 g/dL (ref 32.0–36.0)
MCV: 92 fL (ref 80–100)
MONOS PCT: 7.2 %
Monocyte #: 0.6 x10 3/mm (ref 0.2–0.9)
NEUTROS PCT: 53.9 %
Neutrophil #: 4.3 x10 3/mm (ref 1.4–6.5)
Platelet: 236 x10 3/mm (ref 150–440)
RBC: 4.26 10*6/uL (ref 3.80–5.20)
RDW: 12.7 % (ref 11.5–14.5)
WBC: 8 x10 3/mm (ref 3.6–11.0)

## 2013-09-12 LAB — BASIC METABOLIC PANEL
ANION GAP: 11 (ref 7–16)
BUN: 17 mg/dL (ref 7–18)
Calcium, Total: 9.6 mg/dL (ref 8.5–10.1)
Chloride: 103 mmol/L (ref 98–107)
Co2: 26 mmol/L (ref 21–32)
Creatinine: 1.1 mg/dL (ref 0.60–1.30)
EGFR (African American): >60
EGFR (Non-African Amer.): 54 — ABNORMAL LOW
GLUCOSE: 120 mg/dL — AB (ref 65–99)
OSMOLALITY: 282 (ref 275–301)
POTASSIUM: 3.9 mmol/L (ref 3.5–5.1)
SODIUM: 140 mmol/L (ref 136–145)

## 2013-09-19 LAB — CBC CANCER CENTER
Basophil #: 0.1 x10 3/mm (ref 0.0–0.1)
Basophil %: 0.9 %
EOS ABS: 0.3 x10 3/mm (ref 0.0–0.7)
Eosinophil %: 4.1 %
HCT: 40 % (ref 35.0–47.0)
HGB: 12.9 g/dL (ref 12.0–16.0)
LYMPHS ABS: 2.3 x10 3/mm (ref 1.0–3.6)
LYMPHS PCT: 35.6 %
MCH: 29.5 pg (ref 26.0–34.0)
MCHC: 32.2 g/dL (ref 32.0–36.0)
MCV: 92 fL (ref 80–100)
MONOS PCT: 8.4 %
Monocyte #: 0.6 x10 3/mm (ref 0.2–0.9)
NEUTROS ABS: 3.3 x10 3/mm (ref 1.4–6.5)
Neutrophil %: 51 %
PLATELETS: 230 x10 3/mm (ref 150–440)
RBC: 4.37 10*6/uL (ref 3.80–5.20)
RDW: 13 % (ref 11.5–14.5)
WBC: 6.6 x10 3/mm (ref 3.6–11.0)

## 2013-09-20 ENCOUNTER — Ambulatory Visit: Payer: Self-pay | Admitting: Hematology and Oncology

## 2013-09-26 LAB — CBC CANCER CENTER
BASOS ABS: 0 x10 3/mm (ref 0.0–0.1)
Basophil %: 0.8 %
EOS ABS: 0.3 x10 3/mm (ref 0.0–0.7)
Eosinophil %: 5.3 %
HCT: 40.3 % (ref 35.0–47.0)
HGB: 12.8 g/dL (ref 12.0–16.0)
Lymphocyte #: 1.9 x10 3/mm (ref 1.0–3.6)
Lymphocyte %: 34 %
MCH: 29.2 pg (ref 26.0–34.0)
MCHC: 31.7 g/dL — ABNORMAL LOW (ref 32.0–36.0)
MCV: 92 fL (ref 80–100)
Monocyte #: 0.5 x10 3/mm (ref 0.2–0.9)
Monocyte %: 9.2 %
Neutrophil #: 2.8 x10 3/mm (ref 1.4–6.5)
Neutrophil %: 50.7 %
Platelet: 242 x10 3/mm (ref 150–440)
RBC: 4.38 10*6/uL (ref 3.80–5.20)
RDW: 13.2 % (ref 11.5–14.5)
WBC: 5.5 x10 3/mm (ref 3.6–11.0)

## 2013-10-03 LAB — CBC CANCER CENTER
Basophil #: 0 x10 3/mm (ref 0.0–0.1)
Basophil %: 0.7 %
EOS ABS: 0.2 x10 3/mm (ref 0.0–0.7)
Eosinophil %: 3.9 %
HCT: 37.6 % (ref 35.0–47.0)
HGB: 12 g/dL (ref 12.0–16.0)
Lymphocyte #: 1.5 x10 3/mm (ref 1.0–3.6)
Lymphocyte %: 30.4 %
MCH: 29 pg (ref 26.0–34.0)
MCHC: 32 g/dL (ref 32.0–36.0)
MCV: 91 fL (ref 80–100)
MONO ABS: 0.5 x10 3/mm (ref 0.2–0.9)
MONOS PCT: 9.5 %
NEUTROS ABS: 2.7 x10 3/mm (ref 1.4–6.5)
Neutrophil %: 55.5 %
PLATELETS: 213 x10 3/mm (ref 150–440)
RBC: 4.15 10*6/uL (ref 3.80–5.20)
RDW: 13.2 % (ref 11.5–14.5)
WBC: 4.8 x10 3/mm (ref 3.6–11.0)

## 2013-10-10 LAB — CBC CANCER CENTER
BASOS PCT: 0.9 %
Basophil #: 0 x10 3/mm (ref 0.0–0.1)
Eosinophil #: 0.3 x10 3/mm (ref 0.0–0.7)
Eosinophil %: 5.2 %
HCT: 38.8 % (ref 35.0–47.0)
HGB: 12.5 g/dL (ref 12.0–16.0)
LYMPHS PCT: 28.3 %
Lymphocyte #: 1.5 x10 3/mm (ref 1.0–3.6)
MCH: 29.3 pg (ref 26.0–34.0)
MCHC: 32.3 g/dL (ref 32.0–36.0)
MCV: 91 fL (ref 80–100)
MONO ABS: 0.6 x10 3/mm (ref 0.2–0.9)
Monocyte %: 11.2 %
Neutrophil #: 2.8 x10 3/mm (ref 1.4–6.5)
Neutrophil %: 54.4 %
Platelet: 216 x10 3/mm (ref 150–440)
RBC: 4.28 10*6/uL (ref 3.80–5.20)
RDW: 13.4 % (ref 11.5–14.5)
WBC: 5.1 x10 3/mm (ref 3.6–11.0)

## 2013-10-17 LAB — CBC CANCER CENTER
Basophil #: 0 x10 3/mm (ref 0.0–0.1)
Basophil %: 0.8 %
Eosinophil #: 0.2 x10 3/mm (ref 0.0–0.7)
Eosinophil %: 4.2 %
HCT: 37.5 % (ref 35.0–47.0)
HGB: 12.3 g/dL (ref 12.0–16.0)
Lymphocyte #: 1.1 x10 3/mm (ref 1.0–3.6)
Lymphocyte %: 25.2 %
MCH: 29.4 pg (ref 26.0–34.0)
MCHC: 32.8 g/dL (ref 32.0–36.0)
MCV: 89 fL (ref 80–100)
MONOS PCT: 12.2 %
Monocyte #: 0.5 x10 3/mm (ref 0.2–0.9)
Neutrophil #: 2.5 x10 3/mm (ref 1.4–6.5)
Neutrophil %: 57.6 %
Platelet: 206 x10 3/mm (ref 150–440)
RBC: 4.19 10*6/uL (ref 3.80–5.20)
RDW: 13.3 % (ref 11.5–14.5)
WBC: 4.4 x10 3/mm (ref 3.6–11.0)

## 2013-10-20 ENCOUNTER — Ambulatory Visit: Payer: Self-pay | Admitting: Hematology and Oncology

## 2013-10-20 DIAGNOSIS — Z923 Personal history of irradiation: Secondary | ICD-10-CM

## 2013-10-20 HISTORY — DX: Personal history of irradiation: Z92.3

## 2013-10-24 LAB — CBC CANCER CENTER
Basophil #: 0 x10 3/mm (ref 0.0–0.1)
Basophil %: 0.7 %
EOS ABS: 0.2 x10 3/mm (ref 0.0–0.7)
EOS PCT: 3 %
HCT: 37.7 % (ref 35.0–47.0)
HGB: 12.2 g/dL (ref 12.0–16.0)
LYMPHS ABS: 1.2 x10 3/mm (ref 1.0–3.6)
Lymphocyte %: 21.8 %
MCH: 29.3 pg (ref 26.0–34.0)
MCHC: 32.4 g/dL (ref 32.0–36.0)
MCV: 91 fL (ref 80–100)
MONO ABS: 0.7 x10 3/mm (ref 0.2–0.9)
MONOS PCT: 13.5 %
NEUTROS ABS: 3.3 x10 3/mm (ref 1.4–6.5)
Neutrophil %: 61 %
PLATELETS: 204 x10 3/mm (ref 150–440)
RBC: 4.17 10*6/uL (ref 3.80–5.20)
RDW: 13.7 % (ref 11.5–14.5)
WBC: 5.5 x10 3/mm (ref 3.6–11.0)

## 2013-10-24 LAB — BASIC METABOLIC PANEL
Anion Gap: 6 — ABNORMAL LOW (ref 7–16)
BUN: 14 mg/dL (ref 7–18)
CO2: 30 mmol/L (ref 21–32)
Calcium, Total: 9.2 mg/dL (ref 8.5–10.1)
Chloride: 105 mmol/L (ref 98–107)
Creatinine: 0.86 mg/dL (ref 0.60–1.30)
EGFR (African American): >60
GLUCOSE: 105 mg/dL — AB (ref 65–99)
OSMOLALITY: 282 (ref 275–301)
POTASSIUM: 4.6 mmol/L (ref 3.5–5.1)
Sodium: 141 mmol/L (ref 136–145)

## 2013-10-26 ENCOUNTER — Ambulatory Visit (INDEPENDENT_AMBULATORY_CARE_PROVIDER_SITE_OTHER): Payer: Medicare Other | Admitting: General Surgery

## 2013-10-26 ENCOUNTER — Encounter: Payer: Self-pay | Admitting: General Surgery

## 2013-10-26 VITALS — BP 110/70 | HR 72 | Resp 12 | Ht 63.0 in | Wt 156.0 lb

## 2013-10-26 DIAGNOSIS — C50919 Malignant neoplasm of unspecified site of unspecified female breast: Secondary | ICD-10-CM

## 2013-10-26 NOTE — Patient Instructions (Signed)
Patient to return in August 2015 with a bilateral diagnostic mammogram. Continue self breast exams. Call office for any new breast issues or concerns.

## 2013-10-26 NOTE — Progress Notes (Signed)
Patient ID: [name], female   DOB: 1951-06-13, 63 y.o.   MRN: [id number]  Chief Complaint  Patient presents with  . Follow-up    breast cancer    HPI [name] is a 63 y.o. female who presents for exam status post original excision for breast cancer January 2015 and reexcision to retrieve the biopsy clip in February 2015 . The patient denies any new problems with her breasts at this time. She is currently undergoing radiation therapy. No mammogram done at this time. The patient showed a complete pathological response.  HPI  Past Medical History  Diagnosis Date  . Allergy   . Hepatitis A   . Cancer     uterine   . 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 ER+; PR -; HER-2/neu overexpression, ypT0,ypN0. complete pathological response.    Past Surgical History  Procedure Laterality Date  . Abdominal hysterectomy  1979  . Back surgery    . Colonoscopy  2014  . Breast biopsy Left 2014  . Breast surgery Left 08-16-13    Needle/wire localization of Left breast     Family History  Problem Relation Age of Onset  . Breast cancer Mother     Social History History  Substance Use Topics  . Smoking status: Never Smoker   . Smokeless tobacco: Never Used  . Alcohol Use: No    Allergies  Allergen Reactions  . Codeine     sick  . Sulfa Antibiotics Rash    Current Outpatient Prescriptions  Medication Sig Dispense Refill  . aspirin 81 MG tablet Take 81 mg by mouth daily.      Marland Kitchen FIBER SELECT GUMMIES PO Take 1 tablet by mouth as needed.      . Multiple Vitamins-Minerals (CENTRUM PO) Take by mouth.      . pantoprazole (PROTONIX) 40 MG tablet Take 40 mg by mouth daily.      . Pseudoephedrine HCl (SINUS & ALLERGY 12 HOUR PO) Take 1 tablet by mouth as needed.        No current facility-administered medications for this visit.    Review of Systems Review of Systems  Constitutional: Negative.   Respiratory: Negative.    Cardiovascular: Negative.     Blood pressure 110/70, pulse 72, resp. rate 12, height '5\' 3"'  (1.6 m), weight 156 lb (70.761 kg).  Physical Exam Physical Exam  Constitutional: She is oriented to person, place, and time. She appears well-developed and well-nourished.  Neck: Neck supple. No thyromegaly present.  Cardiovascular: Normal rate, regular rhythm and normal heart sounds.   No murmur heard. Pulmonary/Chest: Effort normal and breath sounds normal. Right breast exhibits no inverted nipple, no mass, no nipple discharge, no skin change and no tenderness. Left breast exhibits no inverted nipple, no mass, no nipple discharge, no skin change and no tenderness.  Extensive skin changes to left breast due to radiation therapy. Very little thickening under incision site of left breast.   Lymphadenopathy:    She has no cervical adenopathy.    She has no axillary adenopathy.  Neurological: She is alert and oriented to person, place, and time.  Skin: Skin is warm and dry.    Data Reviewed No new data.  Assessment    Doing well status post wide excision and now completing whole breast radiation.    Plan    We'll plan for a follow up in August 2015 with bilateral diagnostic mammograms at  that time.    PCP: Beverely Pace Devri Kreher 10/29/2013, 9:14 AM

## 2013-10-29 ENCOUNTER — Encounter: Payer: Self-pay | Admitting: General Surgery

## 2013-11-14 LAB — COMPREHENSIVE METABOLIC PANEL
ALBUMIN: 3.7 g/dL (ref 3.4–5.0)
ALT: 29 U/L (ref 12–78)
ANION GAP: 9 (ref 7–16)
AST: 21 U/L (ref 15–37)
Alkaline Phosphatase: 89 U/L
BUN: 23 mg/dL — ABNORMAL HIGH (ref 7–18)
Bilirubin,Total: 0.3 mg/dL (ref 0.2–1.0)
CALCIUM: 9.4 mg/dL (ref 8.5–10.1)
CHLORIDE: 105 mmol/L (ref 98–107)
CREATININE: 0.86 mg/dL (ref 0.60–1.30)
Co2: 27 mmol/L (ref 21–32)
EGFR (African American): >60
EGFR (Non-African Amer.): >60
GLUCOSE: 96 mg/dL (ref 65–99)
OSMOLALITY: 285 (ref 275–301)
Potassium: 3.9 mmol/L (ref 3.5–5.1)
Sodium: 141 mmol/L (ref 136–145)
TOTAL PROTEIN: 7.1 g/dL (ref 6.4–8.2)

## 2013-11-14 LAB — CBC CANCER CENTER
BASOS ABS: 0 x10 3/mm (ref 0.0–0.1)
BASOS PCT: 0.7 %
Eosinophil #: 0.2 x10 3/mm (ref 0.0–0.7)
Eosinophil %: 3.1 %
HCT: 36.1 % (ref 35.0–47.0)
HGB: 11.9 g/dL — AB (ref 12.0–16.0)
LYMPHS ABS: 1.3 x10 3/mm (ref 1.0–3.6)
Lymphocyte %: 22.6 %
MCH: 29.9 pg (ref 26.0–34.0)
MCHC: 33 g/dL (ref 32.0–36.0)
MCV: 91 fL (ref 80–100)
Monocyte #: 0.7 x10 3/mm (ref 0.2–0.9)
Monocyte %: 11.3 %
NEUTROS PCT: 62.3 %
Neutrophil #: 3.7 x10 3/mm (ref 1.4–6.5)
PLATELETS: 221 x10 3/mm (ref 150–440)
RBC: 3.98 10*6/uL (ref 3.80–5.20)
RDW: 13.4 % (ref 11.5–14.5)
WBC: 5.9 x10 3/mm (ref 3.6–11.0)

## 2013-11-20 ENCOUNTER — Ambulatory Visit: Payer: Self-pay | Admitting: Hematology and Oncology

## 2013-12-20 ENCOUNTER — Ambulatory Visit: Payer: Self-pay | Admitting: Hematology and Oncology

## 2013-12-26 LAB — CBC CANCER CENTER
Basophil #: 0 x10 3/mm (ref 0.0–0.1)
Basophil %: 0.8 %
EOS ABS: 0.2 x10 3/mm (ref 0.0–0.7)
EOS PCT: 2.9 %
HCT: 35.3 % (ref 35.0–47.0)
HGB: 11.5 g/dL — AB (ref 12.0–16.0)
LYMPHS ABS: 1.8 x10 3/mm (ref 1.0–3.6)
LYMPHS PCT: 31.4 %
MCH: 29.8 pg (ref 26.0–34.0)
MCHC: 32.5 g/dL (ref 32.0–36.0)
MCV: 92 fL (ref 80–100)
MONO ABS: 0.4 x10 3/mm (ref 0.2–0.9)
Monocyte %: 6.8 %
NEUTROS ABS: 3.4 x10 3/mm (ref 1.4–6.5)
NEUTROS PCT: 58.1 %
Platelet: 210 x10 3/mm (ref 150–440)
RBC: 3.85 10*6/uL (ref 3.80–5.20)
RDW: 13.1 % (ref 11.5–14.5)
WBC: 5.8 x10 3/mm (ref 3.6–11.0)

## 2013-12-26 LAB — COMPREHENSIVE METABOLIC PANEL
ALK PHOS: 97 U/L
ALT: 39 U/L (ref 12–78)
ANION GAP: 7 (ref 7–16)
AST: 49 U/L — AB (ref 15–37)
Albumin: 3.7 g/dL (ref 3.4–5.0)
BUN: 22 mg/dL — AB (ref 7–18)
Bilirubin,Total: 0.3 mg/dL (ref 0.2–1.0)
CALCIUM: 9.2 mg/dL (ref 8.5–10.1)
CHLORIDE: 105 mmol/L (ref 98–107)
CREATININE: 1.08 mg/dL (ref 0.60–1.30)
Co2: 28 mmol/L (ref 21–32)
EGFR (African American): >60
EGFR (Non-African Amer.): 55 — ABNORMAL LOW
GLUCOSE: 89 mg/dL (ref 65–99)
Osmolality: 282 (ref 275–301)
Potassium: 4.1 mmol/L (ref 3.5–5.1)
Sodium: 140 mmol/L (ref 136–145)
Total Protein: 7.2 g/dL (ref 6.4–8.2)

## 2014-01-16 LAB — CBC CANCER CENTER
BASOS ABS: 0.1 x10 3/mm (ref 0.0–0.1)
Basophil %: 0.8 %
Eosinophil #: 0.2 x10 3/mm (ref 0.0–0.7)
Eosinophil %: 2.3 %
HCT: 37.6 % (ref 35.0–47.0)
HGB: 12.2 g/dL (ref 12.0–16.0)
Lymphocyte #: 2.2 x10 3/mm (ref 1.0–3.6)
Lymphocyte %: 29.1 %
MCH: 29.9 pg (ref 26.0–34.0)
MCHC: 32.5 g/dL (ref 32.0–36.0)
MCV: 92 fL (ref 80–100)
Monocyte #: 0.5 x10 3/mm (ref 0.2–0.9)
Monocyte %: 6.5 %
NEUTROS ABS: 4.5 x10 3/mm (ref 1.4–6.5)
Neutrophil %: 61.3 %
Platelet: 236 x10 3/mm (ref 150–440)
RBC: 4.09 10*6/uL (ref 3.80–5.20)
RDW: 14.1 % (ref 11.5–14.5)
WBC: 7.4 x10 3/mm (ref 3.6–11.0)

## 2014-01-16 LAB — COMPREHENSIVE METABOLIC PANEL
ALBUMIN: 4 g/dL (ref 3.4–5.0)
ALK PHOS: 94 U/L
ANION GAP: 10 (ref 7–16)
BILIRUBIN TOTAL: 0.2 mg/dL (ref 0.2–1.0)
BUN: 19 mg/dL — AB (ref 7–18)
Calcium, Total: 9.8 mg/dL (ref 8.5–10.1)
Chloride: 103 mmol/L (ref 98–107)
Co2: 26 mmol/L (ref 21–32)
Creatinine: 1.24 mg/dL (ref 0.60–1.30)
EGFR (African American): 54 — ABNORMAL LOW
GFR CALC NON AF AMER: 46 — AB
Glucose: 89 mg/dL (ref 65–99)
Osmolality: 279 (ref 275–301)
Potassium: 4.3 mmol/L (ref 3.5–5.1)
SGOT(AST): 25 U/L (ref 15–37)
SGPT (ALT): 29 U/L
SODIUM: 139 mmol/L (ref 136–145)
TOTAL PROTEIN: 7.5 g/dL (ref 6.4–8.2)

## 2014-01-20 ENCOUNTER — Ambulatory Visit: Payer: Self-pay | Admitting: Hematology and Oncology

## 2014-01-20 DIAGNOSIS — Z9289 Personal history of other medical treatment: Secondary | ICD-10-CM

## 2014-01-20 HISTORY — DX: Personal history of other medical treatment: Z92.89

## 2014-02-06 ENCOUNTER — Ambulatory Visit: Payer: Medicare Other | Admitting: General Surgery

## 2014-02-06 LAB — CBC CANCER CENTER
BASOS PCT: 1 %
Basophil #: 0.1 x10 3/mm (ref 0.0–0.1)
EOS ABS: 0.1 x10 3/mm (ref 0.0–0.7)
EOS PCT: 2.4 %
HCT: 37.3 % (ref 35.0–47.0)
HGB: 12.1 g/dL (ref 12.0–16.0)
LYMPHS PCT: 33.1 %
Lymphocyte #: 2 x10 3/mm (ref 1.0–3.6)
MCH: 30 pg (ref 26.0–34.0)
MCHC: 32.4 g/dL (ref 32.0–36.0)
MCV: 93 fL (ref 80–100)
Monocyte #: 0.3 x10 3/mm (ref 0.2–0.9)
Monocyte %: 4.9 %
Neutrophil #: 3.5 x10 3/mm (ref 1.4–6.5)
Neutrophil %: 58.6 %
Platelet: 241 x10 3/mm (ref 150–440)
RBC: 4.04 10*6/uL (ref 3.80–5.20)
RDW: 14.7 % — ABNORMAL HIGH (ref 11.5–14.5)
WBC: 5.9 x10 3/mm (ref 3.6–11.0)

## 2014-02-06 LAB — COMPREHENSIVE METABOLIC PANEL
ALK PHOS: 90 U/L
ALT: 35 U/L
AST: 30 U/L (ref 15–37)
Albumin: 4 g/dL (ref 3.4–5.0)
Anion Gap: 11 (ref 7–16)
BUN: 18 mg/dL (ref 7–18)
Bilirubin,Total: 0.4 mg/dL (ref 0.2–1.0)
CO2: 27 mmol/L (ref 21–32)
Calcium, Total: 9.1 mg/dL (ref 8.5–10.1)
Chloride: 101 mmol/L (ref 98–107)
Creatinine: 1.26 mg/dL (ref 0.60–1.30)
EGFR (African American): 53 — ABNORMAL LOW
EGFR (Non-African Amer.): 45 — ABNORMAL LOW
Glucose: 116 mg/dL — ABNORMAL HIGH (ref 65–99)
OSMOLALITY: 280 (ref 275–301)
Potassium: 4 mmol/L (ref 3.5–5.1)
SODIUM: 139 mmol/L (ref 136–145)
Total Protein: 7.5 g/dL (ref 6.4–8.2)

## 2014-02-08 ENCOUNTER — Ambulatory Visit (INDEPENDENT_AMBULATORY_CARE_PROVIDER_SITE_OTHER): Payer: Medicare Other | Admitting: General Surgery

## 2014-02-08 ENCOUNTER — Encounter: Payer: Self-pay | Admitting: General Surgery

## 2014-02-08 VITALS — BP 122/68 | HR 72 | Resp 12 | Wt 144.0 lb

## 2014-02-08 DIAGNOSIS — M810 Age-related osteoporosis without current pathological fracture: Secondary | ICD-10-CM

## 2014-02-08 DIAGNOSIS — C50919 Malignant neoplasm of unspecified site of unspecified female breast: Secondary | ICD-10-CM

## 2014-02-08 MED ORDER — IBANDRONATE SODIUM 150 MG PO TABS: 150.0000 mg | ORAL_TABLET | ORAL | Status: DC

## 2014-02-08 NOTE — Patient Instructions (Addendum)
Calcium 600 mg 1 tablet Twice a day Boniva once a month

## 2014-02-08 NOTE — Progress Notes (Deleted)
Patient ID: [name], female   DOB: September 09, 1950, 63 y.o.   MRN: [id number]  Chief Complaint  Patient presents with  . Follow-up    mammogram    HPI KESLIE [name] is a 63 y.o. female.  *** HPI  Past Medical History  Diagnosis Date  . Allergy   . Hepatitis A   . Cancer     uterine   . 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 ER+; PR -; HER-2/neu overexpression, ypT0,ypN0. complete pathological response.    Past Surgical History  Procedure Laterality Date  . Abdominal hysterectomy  1979  . Back surgery    . Colonoscopy  2014  . Breast biopsy Left 2014  . Breast surgery Left 08-16-13    Needle/wire localization of Left breast     Family History  Problem Relation Age of Onset  . Breast cancer Mother     Social History History  Substance Use Topics  . Smoking status: Never Smoker   . Smokeless tobacco: Never Used  . Alcohol Use: No    Allergies  Allergen Reactions  . Codeine     sick  . Sulfa Antibiotics Rash    Current Outpatient Prescriptions  Medication Sig Dispense Refill  . aspirin 81 MG tablet Take 81 mg by mouth daily.      Marland Kitchen FIBER SELECT GUMMIES PO Take 1 tablet by mouth as needed.      . Multiple Vitamins-Minerals (CENTRUM PO) Take by mouth.      . pantoprazole (PROTONIX) 40 MG tablet Take 40 mg by mouth daily.      . Pseudoephedrine HCl (SINUS & ALLERGY 12 HOUR PO) Take 1 tablet by mouth as needed.        No current facility-administered medications for this visit.    Review of Systems Review of Systems  There were no vitals taken for this visit.  Physical Exam Physical Exam  Data Reviewed ***  Assessment    ***    Plan    ***       Carson Myrtle 02/08/2014, 2:53 PM

## 2014-02-08 NOTE — Progress Notes (Signed)
Patient ID: [name], female   DOB: May 25, 1951, 63 y.o.   MRN: [id number]  Chief Complaint  Patient presents with  . Follow-up    mammogram    HPI [name] is a 63 y.o. female who presents for a breast evaluation. The most recent mammogram was done on 01-24-14. She had a bone density test completed 01-24-14. Patient does perform regular self breast checks and gets regular mammograms done.   No new breast complaints. Tolerating her Femara. Completed chemotherapy in December 2014. Presently being followed by the nurse practitioner at the cancer center.  HPI  Past Medical History  Diagnosis Date  . Allergy   . Hepatitis A   . Cancer     uterine   . 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 ER+; PR -; HER-2/neu overexpression, ypT0,ypN0. complete pathological response.    Past Surgical History  Procedure Laterality Date  . Abdominal hysterectomy  1979  . Back surgery    . Colonoscopy  2014  . Breast biopsy Left 2014  . Breast surgery Left 08-16-13    Needle/wire localization of Left breast   . Portacath placement  June 2015    Family History  Problem Relation Age of Onset  . Breast cancer Mother     Social History History  Substance Use Topics  . Smoking status: Never Smoker   . Smokeless tobacco: Never Used  . Alcohol Use: No    Allergies  Allergen Reactions  . Codeine     sick  . Sulfa Antibiotics Rash    Current Outpatient Prescriptions  Medication Sig Dispense Refill  . aspirin 81 MG tablet Take 81 mg by mouth daily.      Marland Kitchen FIBER SELECT GUMMIES PO Take 1 tablet by mouth as needed.      Marland Kitchen letrozole (FEMARA) 2.5 MG tablet Take 2.5 mg by mouth daily.      . Multiple Vitamins-Minerals (CENTRUM PO) Take by mouth.      . pantoprazole (PROTONIX) 40 MG tablet Take 40 mg by mouth daily.      . Pseudoephedrine HCl (SINUS & ALLERGY 12 HOUR PO) Take 1 tablet by mouth as needed.       . ibandronate (BONIVA) 150 MG  tablet Take 1 tablet (150 mg total) by mouth every 30 (thirty) days. Take in the morning with a full glass of water, on an empty stomach, and do not take anything else by mouth or lie down for the next 30 min.  12 tablet  5   No current facility-administered medications for this visit.    Review of Systems Review of Systems  Constitutional: Negative.   Respiratory: Negative.   Cardiovascular: Negative.     Blood pressure 122/68, pulse 72, resp. rate 12, weight 144 lb (65.318 kg).  Physical Exam Physical Exam  Constitutional: She is oriented to person, place, and time. She appears well-developed and well-nourished.  Neck: Neck supple.  Cardiovascular: Normal rate, regular rhythm and normal heart sounds.   Pulmonary/Chest: Effort normal and breath sounds normal. Right breast exhibits no inverted nipple, no mass, no nipple discharge, no skin change and no tenderness. Left breast exhibits no inverted nipple, no mass, no nipple discharge, no skin change and no tenderness.  Thickening under right breast scar at 3 o'clock   Lymphadenopathy:    She has no cervical adenopathy.    She has no axillary adenopathy.  Neurological: She is alert and  oriented to person, place, and time.  Skin: Skin is warm and dry.    Data Reviewed Bone density dated January 24, 2014 showed osteoporosis.  Bilateral mammograms in January 24, 2014 showed no interval change. BIRAD-2.  Assessment    Doing well status post breast conservation surgery.  Osteoporosis.     Plan    Importance of adequate dietary calcium, 1200 mg daily was reviewed. She'll be started on Boniva monthly, advised to remain upright as instructed post ingestion to minimize esophagitis.     Boniva monthly and calcium twice a day.  Arrangements were made for a follow up exam in 6 months. No mammograms are required at that time.   PCP: [name] 02/09/2014, 8:17 PM

## 2014-02-09 DIAGNOSIS — M810 Age-related osteoporosis without current pathological fracture: Secondary | ICD-10-CM | POA: Insufficient documentation

## 2014-02-12 ENCOUNTER — Telehealth: Payer: Self-pay | Admitting: *Deleted

## 2014-02-12 NOTE — Telephone Encounter (Signed)
Patient called the office to report she took [place] on Friday, 02-09-14. This patient reports her bones hurt and muscles ache, diarrhea for 3 days, and headaches.   Patient states she cannot take this medication and that this will need to be changed.

## 2014-02-20 ENCOUNTER — Ambulatory Visit: Payer: Self-pay | Admitting: Hematology and Oncology

## 2014-02-22 ENCOUNTER — Telehealth: Payer: Self-pay | Admitting: *Deleted

## 2014-02-22 NOTE — Telephone Encounter (Signed)
Pt has been contacting the office for about 2 weeks now about switching medication and she needs to know something by Tuesday 02/27/14 because she is going out of town for 3 weeks.

## 2014-02-27 NOTE — Telephone Encounter (Signed)
Patient had n/v/diarrhea as well as diffuse muscle/ bone aching with headache that lasted for days.  She will contact the office after returning from a planned vacation, and we will look at other alternatives for osteoporosis treatment.

## 2014-03-21 ENCOUNTER — Telehealth: Payer: Self-pay | Admitting: *Deleted

## 2014-03-21 NOTE — Telephone Encounter (Signed)
Pt called and stated that when she got back from her 3 week trip that she was to call the office and speak to you about switching her medication, she said she does not want anything that's in the [place] family.

## 2014-03-22 ENCOUNTER — Ambulatory Visit: Payer: Self-pay | Admitting: Hematology and Oncology

## 2014-03-27 ENCOUNTER — Telehealth: Payer: Self-pay | Admitting: General Surgery

## 2014-03-27 NOTE — Telephone Encounter (Signed)
Please arrange an appointment w/ Dr. Oliva Bustard re: osteoporosis and need for IV Reclast. Thanks.

## 2014-03-27 NOTE — Telephone Encounter (Signed)
The patient did not tolerate oral Boniva reporting severe bone pain and her refusal to take any additional doses or any cyst or drugs.  I spoke with Dr. Oliva Bustard from the cancer center. She'll be a candidate for an IV infusion of Reclast.   We will arrange for followup in his office in this regard.

## 2014-03-27 NOTE — Telephone Encounter (Signed)
Patient has appt w/ Dr. Oliva Bustard to day.

## 2014-04-10 LAB — COMPREHENSIVE METABOLIC PANEL
Albumin: 4 g/dL (ref 3.4–5.0)
Alkaline Phosphatase: 79 U/L
Anion Gap: 7 (ref 7–16)
BUN: 22 mg/dL — ABNORMAL HIGH (ref 7–18)
Bilirubin,Total: 0.3 mg/dL (ref 0.2–1.0)
CALCIUM: 10.2 mg/dL — AB (ref 8.5–10.1)
Chloride: 101 mmol/L (ref 98–107)
Co2: 28 mmol/L (ref 21–32)
Creatinine: 1.1 mg/dL (ref 0.60–1.30)
EGFR (African American): >60
EGFR (Non-African Amer.): 53 — ABNORMAL LOW
GLUCOSE: 87 mg/dL (ref 65–99)
Osmolality: 275 (ref 275–301)
Potassium: 4.2 mmol/L (ref 3.5–5.1)
SGOT(AST): 21 U/L (ref 15–37)
SGPT (ALT): 31 U/L
SODIUM: 136 mmol/L (ref 136–145)
Total Protein: 7.6 g/dL (ref 6.4–8.2)

## 2014-04-10 LAB — CBC CANCER CENTER
BASOS ABS: 0.1 x10 3/mm (ref 0.0–0.1)
Basophil %: 0.7 %
EOS ABS: 0.2 x10 3/mm (ref 0.0–0.7)
EOS PCT: 2.5 %
HCT: 37.2 % (ref 35.0–47.0)
HGB: 12 g/dL (ref 12.0–16.0)
Lymphocyte #: 1.7 x10 3/mm (ref 1.0–3.6)
Lymphocyte %: 24.6 %
MCH: 30.7 pg (ref 26.0–34.0)
MCHC: 32.3 g/dL (ref 32.0–36.0)
MCV: 95 fL (ref 80–100)
MONO ABS: 0.5 x10 3/mm (ref 0.2–0.9)
Monocyte %: 7.6 %
Neutrophil #: 4.6 x10 3/mm (ref 1.4–6.5)
Neutrophil %: 64.6 %
Platelet: 295 x10 3/mm (ref 150–440)
RBC: 3.9 10*6/uL (ref 3.80–5.20)
RDW: 13.7 % (ref 11.5–14.5)
WBC: 7.1 x10 3/mm (ref 3.6–11.0)

## 2014-04-22 ENCOUNTER — Ambulatory Visit: Payer: Self-pay | Admitting: Hematology and Oncology

## 2014-04-23 ENCOUNTER — Encounter: Payer: Self-pay | Admitting: General Surgery

## 2014-05-01 LAB — BASIC METABOLIC PANEL
Anion Gap: 11 (ref 7–16)
BUN: 17 mg/dL (ref 7–18)
CHLORIDE: 102 mmol/L (ref 98–107)
CO2: 26 mmol/L (ref 21–32)
Calcium, Total: 9.9 mg/dL (ref 8.5–10.1)
Creatinine: 1.31 mg/dL — ABNORMAL HIGH (ref 0.60–1.30)
GFR CALC AF AMER: 53 — AB
GFR CALC NON AF AMER: 44 — AB
GLUCOSE: 90 mg/dL (ref 65–99)
OSMOLALITY: 279 (ref 275–301)
Potassium: 4.4 mmol/L (ref 3.5–5.1)
SODIUM: 139 mmol/L (ref 136–145)

## 2014-05-01 LAB — CBC CANCER CENTER
BASOS ABS: 0.1 x10 3/mm (ref 0.0–0.1)
Basophil %: 1 %
Eosinophil #: 0.2 x10 3/mm (ref 0.0–0.7)
Eosinophil %: 2.5 %
HCT: 36.5 % (ref 35.0–47.0)
HGB: 11.9 g/dL — AB (ref 12.0–16.0)
Lymphocyte #: 1.6 x10 3/mm (ref 1.0–3.6)
Lymphocyte %: 25.5 %
MCH: 31.3 pg (ref 26.0–34.0)
MCHC: 32.6 g/dL (ref 32.0–36.0)
MCV: 96 fL (ref 80–100)
MONO ABS: 0.6 x10 3/mm (ref 0.2–0.9)
MONOS PCT: 9 %
NEUTROS ABS: 4 x10 3/mm (ref 1.4–6.5)
NEUTROS PCT: 62 %
Platelet: 276 x10 3/mm (ref 150–440)
RBC: 3.8 10*6/uL (ref 3.80–5.20)
RDW: 12.8 % (ref 11.5–14.5)
WBC: 6.4 x10 3/mm (ref 3.6–11.0)

## 2014-05-01 LAB — MAGNESIUM: MAGNESIUM: 2.2 mg/dL

## 2014-05-09 ENCOUNTER — Ambulatory Visit (INDEPENDENT_AMBULATORY_CARE_PROVIDER_SITE_OTHER): Payer: Medicare Other | Admitting: General Surgery

## 2014-05-09 ENCOUNTER — Encounter: Payer: Self-pay | Admitting: General Surgery

## 2014-05-09 VITALS — BP 126/68 | HR 76 | Resp 14 | Ht 63.0 in | Wt 147.0 lb

## 2014-05-09 DIAGNOSIS — N63 Unspecified lump in unspecified breast: Secondary | ICD-10-CM

## 2014-05-09 DIAGNOSIS — C50919 Malignant neoplasm of unspecified site of unspecified female breast: Secondary | ICD-10-CM | POA: Insufficient documentation

## 2014-05-09 DIAGNOSIS — C50912 Malignant neoplasm of unspecified site of left female breast: Secondary | ICD-10-CM

## 2014-05-09 DIAGNOSIS — N6320 Unspecified lump in the left breast, unspecified quadrant: Secondary | ICD-10-CM

## 2014-05-09 NOTE — Progress Notes (Signed)
Patient ID: [name], female   DOB: 02/27/1951, 63 y.o.   MRN: [id number]  Chief Complaint  Patient presents with  . Follow-up    left breast lump    HPI [name] is a 63 y.o. female who presents for an evaluation of a left breast lump at previous surgery site. She denies any pain in this area but does state when lifting her left arm she has a pulling sensation in that area. No other breast problems at this time. She noticed the lump approximately 1 week ago.   HPI  Past Medical History  Diagnosis Date  . Allergy   . Hepatitis A   . Cancer     uterine   . 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 ER+; PR -; HER-2/neu overexpression, ypT0,ypN0. complete pathological response.    Past Surgical History  Procedure Laterality Date  . Abdominal hysterectomy  1979  . Back surgery    . Colonoscopy  2014  . Breast biopsy Left 2014  . Breast surgery Left 08-16-13    Needle/wire localization of Left breast   . Portacath placement  June 2015    Family History  Problem Relation Age of Onset  . Breast cancer Mother     Social History History  Substance Use Topics  . Smoking status: Never Smoker   . Smokeless tobacco: Never Used  . Alcohol Use: No    Allergies  Allergen Reactions  . Codeine     sick  . Sulfa Antibiotics Rash    Current Outpatient Prescriptions  Medication Sig Dispense Refill  . ANASTROZOLE PO Take 1 tablet by mouth daily.    Marland Kitchen aspirin 81 MG tablet Take 81 mg by mouth daily.    Marland Kitchen FIBER SELECT GUMMIES PO Take 1 tablet by mouth as needed.    . Multiple Vitamins-Minerals (CENTRUM PO) Take by mouth.    . pantoprazole (PROTONIX) 40 MG tablet Take 40 mg by mouth daily.    . Pseudoephedrine HCl (SINUS & ALLERGY 12 HOUR PO) Take 1 tablet by mouth as needed.      No current facility-administered medications for this visit.    Review of Systems Review of Systems  Constitutional: Negative.   Respiratory:  Negative.   Cardiovascular: Negative.     Blood pressure 126/68, pulse 76, resp. rate 14, height _0  (1.6 m), weight 147 lb (66.679 kg).  Physical Exam Physical Exam  Constitutional: She is oriented to person, place, and time. She appears well-developed and well-nourished.  Neck: Neck supple. No thyromegaly present.  Cardiovascular: Normal rate, regular rhythm and normal heart sounds.   No murmur heard. Pulmonary/Chest: Effort normal and breath sounds normal. Right breast exhibits no inverted nipple, no mass, no nipple discharge, no skin change and no tenderness. Left breast exhibits no inverted nipple, no mass, no nipple discharge, no skin change and no tenderness.    Port is fine.  Well healed incision on left breast.  8 mm thicken mid portion of the wound of left breast.    Lymphadenopathy:    She has no cervical adenopathy.    She has no axillary adenopathy.  Neurological: She is alert and oriented to person, place, and time.  Skin: Skin is warm and dry.    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    Plan    The patient was reassured there is no evidence of  recurrent malignancy. We'll plan for a follow-up exam in February 2016 as previously arranged.    PCP:  Elisabeth Cara 05/09/2014, 8:52 PM

## 2014-05-09 NOTE — Patient Instructions (Signed)
Patient to return as scheduled. Continue self breast exams. Call office for any new breast issues or concerns.

## 2014-05-22 ENCOUNTER — Ambulatory Visit: Payer: Self-pay | Admitting: Hematology and Oncology

## 2014-05-22 LAB — CBC CANCER CENTER
BASOS ABS: 0.1 x10 3/mm (ref 0.0–0.1)
BASOS PCT: 1 %
EOS ABS: 0.2 x10 3/mm (ref 0.0–0.7)
Eosinophil %: 3.6 %
HCT: 36.8 % (ref 35.0–47.0)
HGB: 12 g/dL (ref 12.0–16.0)
LYMPHS ABS: 1.5 x10 3/mm (ref 1.0–3.6)
LYMPHS PCT: 26.4 %
MCH: 31 pg (ref 26.0–34.0)
MCHC: 32.7 g/dL (ref 32.0–36.0)
MCV: 95 fL (ref 80–100)
Monocyte #: 0.4 x10 3/mm (ref 0.2–0.9)
Monocyte %: 7.5 %
Neutrophil #: 3.6 x10 3/mm (ref 1.4–6.5)
Neutrophil %: 61.5 %
Platelet: 261 x10 3/mm (ref 150–440)
RBC: 3.88 10*6/uL (ref 3.80–5.20)
RDW: 12.6 % (ref 11.5–14.5)
WBC: 5.9 x10 3/mm (ref 3.6–11.0)

## 2014-05-22 LAB — BASIC METABOLIC PANEL
Anion Gap: 11 (ref 7–16)
BUN: 24 mg/dL — AB (ref 7–18)
CALCIUM: 10.4 mg/dL — AB (ref 8.5–10.1)
CHLORIDE: 101 mmol/L (ref 98–107)
CO2: 29 mmol/L (ref 21–32)
Creatinine: 1.43 mg/dL — ABNORMAL HIGH (ref 0.60–1.30)
EGFR (Non-African Amer.): 39 — ABNORMAL LOW
GFR CALC AF AMER: 48 — AB
Glucose: 88 mg/dL (ref 65–99)
Osmolality: 285 (ref 275–301)
POTASSIUM: 4.2 mmol/L (ref 3.5–5.1)
SODIUM: 141 mmol/L (ref 136–145)

## 2014-05-22 LAB — MAGNESIUM: Magnesium: 2.1 mg/dL

## 2014-05-23 LAB — CANCER ANTIGEN 27.29: CA 27.29: 15.7 U/mL (ref 0.0–38.6)

## 2014-06-12 LAB — CBC CANCER CENTER
BASOS PCT: 0.9 %
Basophil #: 0 x10 3/mm (ref 0.0–0.1)
EOS ABS: 0.2 x10 3/mm (ref 0.0–0.7)
Eosinophil %: 4 %
HCT: 36.9 % (ref 35.0–47.0)
HGB: 12.1 g/dL (ref 12.0–16.0)
LYMPHS ABS: 1.2 x10 3/mm (ref 1.0–3.6)
LYMPHS PCT: 20.7 %
MCH: 30.6 pg (ref 26.0–34.0)
MCHC: 32.7 g/dL (ref 32.0–36.0)
MCV: 94 fL (ref 80–100)
MONO ABS: 0.5 x10 3/mm (ref 0.2–0.9)
Monocyte %: 8.8 %
NEUTROS ABS: 3.7 x10 3/mm (ref 1.4–6.5)
NEUTROS PCT: 65.6 %
Platelet: 238 x10 3/mm (ref 150–440)
RBC: 3.95 10*6/uL (ref 3.80–5.20)
RDW: 12.7 % (ref 11.5–14.5)
WBC: 5.6 x10 3/mm (ref 3.6–11.0)

## 2014-06-12 LAB — BASIC METABOLIC PANEL
ANION GAP: 11 (ref 7–16)
BUN: 17 mg/dL (ref 7–18)
CHLORIDE: 103 mmol/L (ref 98–107)
CO2: 29 mmol/L (ref 21–32)
Calcium, Total: 9.2 mg/dL (ref 8.5–10.1)
Creatinine: 1.29 mg/dL (ref 0.60–1.30)
EGFR (African American): 54 — ABNORMAL LOW
EGFR (Non-African Amer.): 44 — ABNORMAL LOW
Glucose: 89 mg/dL (ref 65–99)
OSMOLALITY: 286 (ref 275–301)
Potassium: 4 mmol/L (ref 3.5–5.1)
Sodium: 143 mmol/L (ref 136–145)

## 2014-06-12 LAB — MAGNESIUM: MAGNESIUM: 2.1 mg/dL

## 2014-06-22 ENCOUNTER — Ambulatory Visit: Payer: Self-pay | Admitting: Hematology and Oncology

## 2014-07-03 LAB — COMPREHENSIVE METABOLIC PANEL
ALK PHOS: 59 U/L
ALT: 24 U/L
Albumin: 3.7 g/dL (ref 3.4–5.0)
Anion Gap: 6 — ABNORMAL LOW (ref 7–16)
BUN: 17 mg/dL (ref 7–18)
Bilirubin,Total: 0.2 mg/dL (ref 0.2–1.0)
CREATININE: 1.07 mg/dL (ref 0.60–1.30)
Calcium, Total: 9.1 mg/dL (ref 8.5–10.1)
Chloride: 106 mmol/L (ref 98–107)
Co2: 27 mmol/L (ref 21–32)
EGFR (African American): >60
GFR CALC NON AF AMER: 55 — AB
GLUCOSE: 80 mg/dL (ref 65–99)
Osmolality: 278 (ref 275–301)
Potassium: 4.1 mmol/L (ref 3.5–5.1)
SGOT(AST): 28 U/L (ref 15–37)
Sodium: 139 mmol/L (ref 136–145)
Total Protein: 7.1 g/dL (ref 6.4–8.2)

## 2014-07-03 LAB — CBC CANCER CENTER
BASOS PCT: 0.9 %
Basophil #: 0.1 x10 3/mm (ref 0.0–0.1)
EOS PCT: 4 %
Eosinophil #: 0.2 x10 3/mm (ref 0.0–0.7)
HCT: 36.3 % (ref 35.0–47.0)
HGB: 11.6 g/dL — AB (ref 12.0–16.0)
Lymphocyte #: 1.8 x10 3/mm (ref 1.0–3.6)
Lymphocyte %: 30.4 %
MCH: 30.2 pg (ref 26.0–34.0)
MCHC: 32.1 g/dL (ref 32.0–36.0)
MCV: 94 fL (ref 80–100)
Monocyte #: 0.5 x10 3/mm (ref 0.2–0.9)
Monocyte %: 8.5 %
Neutrophil #: 3.4 x10 3/mm (ref 1.4–6.5)
Neutrophil %: 56.2 %
Platelet: 240 x10 3/mm (ref 150–440)
RBC: 3.86 10*6/uL (ref 3.80–5.20)
RDW: 12.9 % (ref 11.5–14.5)
WBC: 6 x10 3/mm (ref 3.6–11.0)

## 2014-07-03 LAB — MAGNESIUM: MAGNESIUM: 1.9 mg/dL

## 2014-07-23 ENCOUNTER — Ambulatory Visit: Payer: Self-pay | Admitting: Hematology and Oncology

## 2014-08-08 ENCOUNTER — Ambulatory Visit (INDEPENDENT_AMBULATORY_CARE_PROVIDER_SITE_OTHER): Payer: Medicare Other | Admitting: General Surgery

## 2014-08-08 ENCOUNTER — Encounter: Payer: Self-pay | Admitting: General Surgery

## 2014-08-08 VITALS — BP 110/80 | HR 74 | Resp 12 | Ht 63.0 in | Wt 153.0 lb

## 2014-08-08 DIAGNOSIS — C50912 Malignant neoplasm of unspecified site of left female breast: Secondary | ICD-10-CM

## 2014-08-08 NOTE — Progress Notes (Signed)
Patient ID: [name], female   DOB: [birth year], 64 y.o.   MRN: [id number]  Chief Complaint  Patient presents with  . Follow-up    breast cancer    HPI [name] is a 64 y.o. female here today for her six month breast cancer check up. Patient denies any problems with the breasts. She states she has had symptoms of blurry vision, weakness, nausea, headaches, and fatigue that started approximately 2 weeks ago. She feels this is related to the Faslodex.  She has had 3 injections so far.  She had similar visual symptoms with the use of Aromasin and Femara in the past. She did not tolerate tamoxifen in the past.  HPI  Past Medical History  Diagnosis Date  . Allergy   . Hepatitis A   . Cancer     uterine   . 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 ER+; PR -; HER-2/neu overexpression, ypT0,ypN0. complete pathological response.    Past Surgical History  Procedure Laterality Date  . Abdominal hysterectomy  1979  . Back surgery    . Colonoscopy  2014  . Breast biopsy Left 2014  . Breast surgery Left 08-16-13    Needle/wire localization of Left breast   . Portacath placement  June 2015    Family History  Problem Relation Age of Onset  . Breast cancer Mother     Social History History  Substance Use Topics  . Smoking status: Never Smoker   . Smokeless tobacco: Never Used  . Alcohol Use: No    Allergies  Allergen Reactions  . Codeine     sick  . Sulfa Antibiotics Rash    Current Outpatient Prescriptions  Medication Sig Dispense Refill  . ANASTROZOLE PO Take 1 tablet by mouth daily.    Marland Kitchen aspirin 81 MG tablet Take 81 mg by mouth daily.    Marland Kitchen FIBER SELECT GUMMIES PO Take 1 tablet by mouth as needed.    . fulvestrant (FASLODEX) 250 MG/5ML injection Inject 250 mg into the muscle once. One injection each buttock over 1-2 minutes. Warm prior to use.    . Multiple Vitamins-Minerals (CENTRUM PO) Take by mouth.    .  pantoprazole (PROTONIX) 40 MG tablet Take 40 mg by mouth daily.    . Pseudoephedrine HCl (SINUS & ALLERGY 12 HOUR PO) Take 1 tablet by mouth as needed.      No current facility-administered medications for this visit.    Review of Systems Review of Systems  Constitutional: Positive for fatigue.  Respiratory: Negative.   Cardiovascular: Negative.     Blood pressure 110/80, pulse 74, resp. rate 12, height '5\' 3"'  (1.6 m), weight 153 lb (69.4 kg).  Physical Exam Physical Exam  Constitutional: She is oriented to person, place, and time. She appears well-developed and well-nourished.  Neck: Neck supple. No thyromegaly present.  Cardiovascular: Normal rate, regular rhythm and normal heart sounds.   No murmur heard. Pulmonary/Chest: Effort normal and breath sounds normal. Right breast exhibits no inverted nipple, no mass, no nipple discharge, no skin change and no tenderness. Left breast exhibits no inverted nipple, no mass, no nipple discharge, no skin change and no tenderness.  Well healed incision at 2 o'clock left breast.   Lymphadenopathy:    She has no cervical adenopathy.    She has no axillary adenopathy.  Neurological: She is alert and oriented to person, place, and time.  Skin: Skin is warm  and dry.    Data Reviewed Medical oncology notes of January 2016  Assessment    Doing well status post breast conservation.    Plan    The patient will continue regular follow-up with medical oncology. She'll return here in 6 months with repeat bilateral mammograms at that time.     PCP:  Elisabeth Cara 08/08/2014, 9:36 PM

## 2014-08-08 NOTE — Patient Instructions (Addendum)
Patient advised to call prescribing doctor about symptoms of Faslodex. Patient to return in 6 months with bilateral diagnostic mammogram at [facility]. The patient is aware to call back for any questions or concerns.

## 2014-08-16 DIAGNOSIS — E039 Hypothyroidism, unspecified: Secondary | ICD-10-CM | POA: Insufficient documentation

## 2014-08-21 ENCOUNTER — Ambulatory Visit
Admit: 2014-08-21 | Disposition: A | Discharge status: Home / Self Care | Payer: Self-pay | Attending: Hematology and Oncology | Admitting: Hematology and Oncology

## 2014-08-22 ENCOUNTER — Ambulatory Visit: Payer: Self-pay | Admitting: Family Medicine

## 2014-09-21 ENCOUNTER — Ambulatory Visit
Admit: 2014-09-21 | Disposition: A | Discharge status: Home / Self Care | Payer: Self-pay | Attending: Hematology and Oncology | Admitting: Hematology and Oncology

## 2014-10-12 ENCOUNTER — Emergency Department
Admit: 2014-10-12 | Disposition: A | Discharge status: Home / Self Care | Payer: Self-pay | Admitting: Emergency Medicine

## 2014-10-12 LAB — COMPREHENSIVE METABOLIC PANEL
ALBUMIN: 4.8 g/dL
ALK PHOS: 52 U/L
ALT: 19 U/L
Anion Gap: 6 — ABNORMAL LOW (ref 7–16)
BILIRUBIN TOTAL: 0.5 mg/dL
BUN: 19 mg/dL
CREATININE: 0.87 mg/dL
Calcium, Total: 9.6 mg/dL
Chloride: 106 mmol/L
Co2: 26 mmol/L
EGFR (Non-African Amer.): >60
GLUCOSE: 93 mg/dL
Potassium: 4 mmol/L
SGOT(AST): 24 U/L
Sodium: 138 mmol/L
Total Protein: 8 g/dL

## 2014-10-12 LAB — CBC WITH DIFFERENTIAL/PLATELET
BASOS PCT: 0.6 %
Basophil #: 0 10*3/uL (ref 0.0–0.1)
Eosinophil #: 0.2 10*3/uL (ref 0.0–0.7)
Eosinophil %: 3.6 %
HCT: 41.1 % (ref 35.0–47.0)
HGB: 13.3 g/dL (ref 12.0–16.0)
Lymphocyte #: 1.9 10*3/uL (ref 1.0–3.6)
Lymphocyte %: 29.2 %
MCH: 30.4 pg (ref 26.0–34.0)
MCHC: 32.4 g/dL (ref 32.0–36.0)
MCV: 94 fL (ref 80–100)
MONO ABS: 0.6 x10 3/mm (ref 0.2–0.9)
Monocyte %: 10 %
NEUTROS ABS: 3.7 10*3/uL (ref 1.4–6.5)
Neutrophil %: 56.6 %
Platelet: 253 10*3/uL (ref 150–440)
RBC: 4.39 10*6/uL (ref 3.80–5.20)
RDW: 12.8 % (ref 11.5–14.5)
WBC: 6.5 10*3/uL (ref 3.6–11.0)

## 2014-10-12 LAB — URINALYSIS, COMPLETE
BACTERIA: NONE SEEN
Bilirubin,UR: NEGATIVE
Blood: NEGATIVE
Glucose,UR: NEGATIVE mg/dL (ref 0–75)
Ketone: NEGATIVE
Leukocyte Esterase: NEGATIVE
NITRITE: NEGATIVE
PH: 5 (ref 4.5–8.0)
PROTEIN: NEGATIVE
RBC,UR: NONE SEEN /HPF (ref 0–5)
SPECIFIC GRAVITY: 1.014 (ref 1.003–1.030)

## 2014-10-12 NOTE — Op Note (Signed)
PATIENT NAME:  [name], [name] MR#:  [id number] DATE OF BIRTH:  Apr 07, 1951  DATE OF PROCEDURE:  12/16/2012  PREOPERATIVE DIAGNOSIS:  Left breast cancer.   POSTOPERATIVE DIAGNOSIS:  Left breast cancer.   OPERATIVE PROCEDURE:  Right subclavian power port placement.   OPERATING SURGEON:  Hervey Ard, M.D.   ANESTHESIA:  Attended local, 10 mL 1% plain Xylocaine.   ESTIMATED BLOOD LOSS:  Minimal.   CLINICAL NOTE:  This 64 year old woman was identified with a stage II breast cancer and is felt to be a candidate for neoadjuvant chemotherapy.  Central venous access was requested by her treating oncologist.   OPERATIVE NOTE:  With the patient under adequate sedation, the right chest was prepped with ChloraPrep and draped.  She had previously received Kefzol 2 grams intravenously.  Ultrasound was used to confirm patency of the subclavian vein.  This was then cannulated and the guidewire advanced into the SVC.  The dilator was placed followed by the catheter.  This was tunneled to a pocket on the right anterior chest and anchored in three point fixation with 3-0 Prolene sutures.  The catheter easily irrigated and aspirated in this position.  The adipose layer was closed with a running 3-0 Vicryl and the skin closed with running 4-0 Vicryl subcuticular suture.  Benzoin and Steri-Strips followed by Telfa and Tegaderm dressings were applied.    Erect portable chest x-ray in the recovery room showed the catheter tip as described above and no evidence of pneumothorax.  The patient tolerated the procedure well.     ____________________________ Robert Bellow, MD jwb:ea D: 12/16/2012 17:22:36 ET T: 12/17/2012 04:38:34 ET JOB#: [id number]  cc: Robert Bellow, MD, <Dictator> Kerin Perna, MD Rae Halsted. Kallie Edward, MD Robert Bellow, MD Levan Aloia Amedeo Kinsman MD ELECTRONICALLY SIGNED 12/17/2012 9:15

## 2014-10-13 NOTE — Consult Note (Signed)
Reason for Visit: This 64 year old Female patient presents to the clinic for initial evaluation of  breast cancer .   Referred by Dr. Bary Castilla.  Diagnosis:  Chief Complaint/Diagnosis   patient is a 64 year old female with an initial stage  IIa (T1 C. N1 M0) invasive mammary carcinoma left breast status post neoadjuvant chemotherapy and then with complete response at the time of reexcision  tumor is ER/PR positive HER-2/neu overexpressed currently on Herceptin has completed all  neoadjuvant chemotherapy  Pathology Report pathology reports reviewed   Imaging Report mammograms reviewed   Referral Report clinical notes reviewed   Planned Treatment Regimen adjuvant left breast and peripheral lymphatic radiation   HPI   ppatient is a pleasant 64 year old female who presented on screening mammogram with a area of asymmetry in the left breast with irregular borders BI-RADS 4.. Patient also had known enlarged lymph node and underwent biopsy which was positive for invasive mammary carcinoma. Lymph node also had 4 mm  micrometastatic disease. Lymph node measured approximately 2.2 cm in greatest dimension by mammography the left breast mass was  1.4 cm in greatest dimension. Tumor was overall grade 3 ER/PR positive HER-2/neu overexpressed. She underwent neoadjuvant chemotherapy consisting of Cytoxan neomycin Paxil which she has completed. She had some problems with dehydration nausea and vomiting although it is doing well at the present time.  She had reexcision by Dr. Tollie Pizza showing no evidence of residual disease.she is now seen for consideration of whole breast and peripheral hepatic radiation. She specifically denies breast tenderness cough or bone pain. She is doing extremely well.  Past Hx:    hepatitis: A, 1988   COPD: per cxray 2014   Breast Cancer:    Left breast wide excision with SN biopsy: Jan 2015   Port Placement: 2014   carpal tunnel release:    back surgery times 3:     hysterectomy:   Past, Family and Social History:  Past Medical History positive   Respiratory COPD   Gastrointestinal hepatitis A   Past Surgical History carpal tunnel release, hysterectomy, back surgery x3   Family History positive   Family History Comments mother with breast cancer  has had BRCA testing which was negative   Social History noncontributory   Additional Past Medical and Surgical History seen by herself today   Allergies:   Codeine: N/V/Diarrhea  Sulfa drugs: Rash, Itching  Tramadol: Itching, GI Distress, N/V/Diarrhea  Pollen: Other  Home Meds:  Home Medications: Medication Instructions Status  desloratadine-pseudoephedrine 5 mg-240 mg oral tablet, extended release 1 tab(s) orally once a day Active  docusate sodium 100 mg oral capsule 1 cap(s) orally once a day Active  aspirin 81 mg oral delayed release tablet 1 tab(s) orally once a day Active  Centrum Silver Ultra Women's Therapeutic Multiple Vitamins with Minerals oral tablet 1 tab(s) orally once a day Active  Gummy Fiber 1 gum orally once a day Active  Tylenol 325 mg oral tablet 2 tab(s) orally every 4 hours, As Needed - for Pain Active   Review of Systems:  General negative   Performance Status (ECOG) 0   Skin negative   Breast see HPI   Ophthalmologic negative   ENMT negative   Respiratory and Thorax negative   Cardiovascular negative   Gastrointestinal negative   Genitourinary negative   Musculoskeletal negative   Neurological negative   Psychiatric negative   Hematology/Lymphatics negative   Endocrine negative   Allergic/Immunologic negative   Review of Systems   rreview  of systems obtained from nurses notes  Nursing Notes:  Nursing Vital Signs and Chemo Nursing Nursing Notes: *CC Vital Signs Flowsheet:   05-Mar-15 14:03  Temp Temperature 96.9  Pulse Pulse 72  Respirations Respirations 20  SBP SBP 115  DBP DBP 74  Pain Scale (0-10)  0  Current Weight (kg) (kg)  80  Height (cm) centimeters 156  BSA (m2) 1.7   Physical Exam:  General/Skin/HEENT:  General normal   Eyes normal   ENMT normal   Head and Neck normal   Additional PE well-developed well tanned female in NAD. Lungs are clear to A&P cardiac examination shows regular rate and rhythm. Left breast is wide local excision scar at the 3:00 position which is healed well. No dominant mass or nodularity is noted in either breast in 2 positions examined. No axillary or supraclavicular adenopathy is appreciated. Abdomen is benign.   Breasts/Resp/CV/GI/GU:  Respiratory and Thorax normal   Cardiovascular normal   Gastrointestinal normal   Genitourinary normal   MS/Neuro/Psych/Lymph:  Musculoskeletal normal   Neurological normal   Lymphatics normal   Other Results:  Radiology Results: [place]:    16-Jun-14 13:55, US Breast Left  US Breast Left   REASON FOR EXAM:    av lt asymmetric density  COMMENTS:       PROCEDURE: [place]  - US BREAST LEFT  - Dec 05 2012  1:55PM     RESULT: Focus left breast ultrasound dated 12/05/2012.    Findings: The left breast evaluated in the region of interest from the   12:00 tube the 4 clock position.    At the 1:30 position approximately 1 to 2 cm deep to the nipple a   hypoechoic well-circumscribed smoothly marginated nodule is identified.   This nodule measures 1.03 x 0.53 x 0.99 cm. There is no evidence of   vascularity associated with this nodule no evidence of architectural   distortion. This correlates with a stable benign appearing retroareolar     nodule on mammogram.    At the 3:00 position 3 cm from the nipple a hypoechoic irregularly   contoured nodule is appreciated. This area measures 1.56 x 1.18 x 1   centimeter. There are areas of internal vascularity associated with this   nodule as well as a component of acoustic shadowing. The sonographic   characteristics of this nodule are concerning.    IMPRESSION:  Concerning nodule at the 3:00  position please refer to the   additional radiographic view dictation for complete discussion  2. Stable benign appearing nodule at the 1:30 position correlating with a   stable radiographic finding.        Verified By: Mikki Santee, M.D., MD  LabUnknown:    16-Jun-14 13:01, Digital Additional Views Lt Breast [facility])  PACS Image     16-Jun-14 13:55, US Breast Left  PACS [place]:    16-Jun-14 13:01, Digital Additional Views Lt Breast (SCR)  Digital Additional Views Lt Breast (SCR)   REASON FOR EXAM:    av lt asymmetric density  COMMENTS:       PROCEDURE: MAM - MAM DGTL ADD VW LT  SCR  - Dec 05 2012  1:01PM     RESULT: Additional views left breast dated 12/05/2012.    Findings: The area of asymmetric density within the left breastpersist   on magnification compression imaging. The borders are irregular. There   does not appear to be associated architectural distortion. There are  areas of mild spiculation and no evidence of malignant type   calcifications. Sonographic evaluation of this region demonstrates   hypoechoic nodule with irregular borders, areas of internal vascularity   and components of acoustic shadowing. The radiographic and sonographic   characteristics of this nodule are concerning.  IMPRESSION:  BI-RADS:Category 4 - Suspicious Abnormality - Biopsy Should   Be Considered        Verified By: Mikki Santee, M.D., MD   Relevent Results:   Relevant Scans and Labs mammograms and ultrasound reviewed   Assessment and Plan: Impression:   initial stage IIa invasive mammary carcinoma of the left breast status post neoadjuvant chemotherapy with complete response  tumor is ER/PR positive and HER-2/neu overexpressed continues on Herceptin now for whole breast radiation and peripheral lymphatics. Plan:   at this time I have recommended whole breast radiation plus recent therapy to her peripheral lymphatics based on incomplete axillary node dissection  initial positive lymph node and her overexpression of HER-2/neu. I will plan on delivering 5000 cGy to her whole breast and peripheral lymphatics boosting her breast scar another 1600 cGy using electron beam. Risks and benefits of treatment including skin reaction, fatigue, alteration blood counts, thickening of the breast, and possible lymphedema in her left upper extremity or were explained in detail. I've gone over exercise regimen for use of her left upper extremity to try to prevent lymphedema. Patient will also be a candidate for aromatase inhibitor after completion of radiation therapy. I've sent her for CT simulation in about a week's time. Discussed the case personally with Dr. Kallie Edward.  I would like to take this opportunity for allowing me to participate in the care of your patient..  CC Referral:  cc: Dr. Bary Castilla,  Dr. Hoy Morn   Electronic Signatures: Baruch Gouty, Roda Shutters (MD)  (Signed 05-Mar-15 15:22)  Authored: HPI, Diagnosis, Past Hx, PFSH, Allergies, Home Meds, ROS, Nursing Notes, Physical Exam, Other Results, Relevent Results, Encounter Assessment and Plan, CC Referring Physician   Last Updated: 05-Mar-15 15:22 by Armstead Peaks (MD)

## 2014-10-13 NOTE — Op Note (Signed)
PATIENT NAME:  [name], [name] MR#:  [id number] DATE OF BIRTH:  1950-11-06  DATE OF PROCEDURE:  07/03/2013  PREOPERATIVE DIAGNOSIS: Left breast cancer, status post neoadjuvant chemotherapy.   POSTOPERATIVE DIAGNOSIS:  Left breast cancer, status post neoadjuvant chemotherapy.   OPERATIVE PROCEDURE: Wide excision, left breast, with sentinel node biopsy.   SURGEON: Hervey Ard, M.D.   ANESTHESIA: General by LMA under Dr. [place] Service, Marcaine 0.5% with 1:200,000 units of epinephrine, 25 mL local infiltration.   ESTIMATED BLOOD LOSS: Minimal.   CLINICAL NOTE: This 64 year old woman was identified with a left breast cancer with nodal disease in June 2014. She underwent neoadjuvant chemotherapy with Adriamycin and Cytoxan followed by Taxol. She is admitted now for planned wide excision and sentinel node biopsy if possible.   The patient was injected with technetium sulfur colloid prior to the procedure.   After the induction of general anesthesia, 3 mL of methylene blue, which had been diluted 1:2 with normal saline was injected in the subareolar plexus. The breast, chest and axilla were then prepped with ChloraPrep and draped. Attention was turned to the axilla. The Gamma Finder showed increased uptake in the inferior aspect in the location previously identified to be the site of a large lymph node prechemotherapy, but normal size node postchemotherapy. The skin was incised sharply after local anesthesia was infiltrated. Hemostasis was with electrocautery. The single hot blue node was identified, and touch preps were negative for metastatic disease. Scanning through the remaining axilla showed no other areas of increased uptake and no palpable adenopathy. The wound was subsequently closed with 2-0 Vicryl in multiple layers followed by a running 4-0 Vicryl subcuticular suture for the skin.   Attention was turned to the breast. Ultrasound was used to confirm the small 9 mm area of residual density  at the three o'clock position 3 cm from the areola at the site of her original tumor. A radial incision from the base of the nipple to the three o'clock position along the anterior axillary line was outlined, and the tissue from the 2 o'clock  to 4 o'clock  position resected beginning below the adipose layer and extending down to and including the pectoralis fascia. Specimen radiograph did not confirm the previously placed clip, and additional tissue in the retroareolar area was excised, again, imaging failing to show a biopsy clip. Having reviewed the free biopsy, June 2014 mammograms and ultrasound studies, it was felt that the area of concern had been resected and it was possible that a clip without a metallic component had been deployed. The breast was elevated off the underlying pectoralis fascia for approximately 5 cm in all directions, and then approximated with interrupted 2-0 Vicryl figure-of-eight sutures. The adipose tissue was approximated in a similar fashion. The skin was elevated as a superficial flap for approximately 2 cm to remove any dimpling. This was then approximated with a running 2-0 Vicryl deep dermal suture. Benzoin and Steri-Strips followed by a Telfa gauze, fluff gauze, Kerlix and an Ace wrap was then applied.   The patient tolerated the procedure well and was taken to the recovery room in stable condition.     ____________________________ Robert Bellow, MD jwb:dmm D: 07/03/2013 14:33:21 ET T: 07/03/2013 21:43:00 ET JOB#: [id number]  cc: Robert Bellow, MD, <Dictator> Kerin Perna, MD Rae Halsted. Kallie Edward, MD Jeret Goyer Amedeo Kinsman MD ELECTRONICALLY SIGNED 07/05/2013 12:35

## 2014-10-13 NOTE — Op Note (Signed)
PATIENT NAME:  [name], [name] MR#:  [id number] DATE OF BIRTH:  10-06-1950  DATE OF PROCEDURE:  08/16/2013  DATE OF DICTATION: 08/17/2013   PREOPERATIVE DIAGNOSIS: Invasive mammary carcinoma of the left breast.   POSTOPERATIVE DIAGNOSIS: Invasive mammary carcinoma of the left breast.   OPERATIVE PROCEDURE: Left breast biopsy.   OPERATING SURGEON: Robert Bellow, MD  ANESTHESIA: General by LMA, 0.5% Marcaine with 1:200,000 units of epinephrine.   ESTIMATED BLOOD LOSS: Minimal.   CLINICAL NOTE: This 64 year old woman had previously undergone neoadjuvant chemotherapy for a T2 N1 carcinoma of the left breast. She underwent planned excision of the primary site and sentinel node biopsy approximately 1 month ago. A biopsy clip was not evident in the resected specimen. Post wide excision mammography showed the clip in place, and she underwent needle localization this morning for planned excision of the clip to confirm that she has had a complete pathologic response to her neoadjuvant treatment.   OPERATIVE NOTE: With the patient under adequate general anesthesia, the localizing wire was trimmed and the breast prepped with ChloraPrep and draped. The previously made radial incision at the 3 o'clock position of the left breast was opened for approximately 5 cm length. The localizing wire was identified, and a 2.5 cm diameter by 5 cm long core of tissue including the entire wire was removed, orientated and specimen radiograph obtained, confirming the previously placed clip in place. Hemostasis was with electrocautery. The wound was closed with multiple layers of 2-0 Vicryl figure-of-eight sutures. The skin was closed with a running 4-0 Vicryl subcuticular suture. Benzoin and Steri-Strips followed by fluff gauze, Kerlix and an Ace wrap were applied.   The patient tolerated the procedure well.    ____________________________ Robert Bellow, MD jwb:lb D: 08/17/2013 10:32:53 ET T: 08/17/2013  11:29:23 ET JOB#: [id number]  cc: Robert Bellow, MD, <Dictator> Kerin Perna, MD Rae Halsted. Kallie Edward, MD Garvis Downum Amedeo Kinsman MD ELECTRONICALLY SIGNED 08/18/2013 8:54

## 2014-10-18 LAB — COMPREHENSIVE METABOLIC PANEL
ALBUMIN: 4.4 g/dL
ALK PHOS: 50 U/L
ALT: 16 U/L
ANION GAP: 7 (ref 7–16)
BILIRUBIN TOTAL: 0.4 mg/dL
BUN: 24 mg/dL — AB
CHLORIDE: 103 mmol/L
Calcium, Total: 9.3 mg/dL
Co2: 25 mmol/L
Creatinine: 0.9 mg/dL
EGFR (African American): >60
EGFR (Non-African Amer.): >60
Glucose: 89 mg/dL
Potassium: 4.4 mmol/L
SGOT(AST): 20 U/L
Sodium: 135 mmol/L
Total Protein: 7.3 g/dL

## 2014-10-18 LAB — CBC CANCER CENTER
BASOS PCT: 0.8 %
Basophil #: 0 x10 3/mm (ref 0.0–0.1)
Eosinophil #: 0.3 x10 3/mm (ref 0.0–0.7)
Eosinophil %: 5 %
HCT: 38.3 % (ref 35.0–47.0)
HGB: 12.5 g/dL (ref 12.0–16.0)
Lymphocyte #: 2 x10 3/mm (ref 1.0–3.6)
Lymphocyte %: 33.5 %
MCH: 30.3 pg (ref 26.0–34.0)
MCHC: 32.6 g/dL (ref 32.0–36.0)
MCV: 93 fL (ref 80–100)
MONOS PCT: 9.2 %
Monocyte #: 0.5 x10 3/mm (ref 0.2–0.9)
Neutrophil #: 3.1 x10 3/mm (ref 1.4–6.5)
Neutrophil %: 51.5 %
PLATELETS: 238 x10 3/mm (ref 150–440)
RBC: 4.12 10*6/uL (ref 3.80–5.20)
RDW: 12.8 % (ref 11.5–14.5)
WBC: 5.9 x10 3/mm (ref 3.6–11.0)

## 2014-10-19 LAB — CANCER ANTIGEN 27.29: CA 27.29: 28.1 U/mL (ref 0.0–38.6)

## 2014-10-23 ENCOUNTER — Telehealth: Payer: Self-pay | Admitting: *Deleted

## 2014-10-23 NOTE — Telephone Encounter (Signed)
Asked for results which I told her and then she asked if anything is going to be done about the nodule on her adrenal gland since the Ca 27-29 wasa ibn normal range.

## 2014-10-24 NOTE — Telephone Encounter (Signed)
Informed pt that per Dr. 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

## 2014-11-06 ENCOUNTER — Inpatient Hospital Stay: Payer: Medicare Other | Attending: Hematology and Oncology

## 2014-11-06 DIAGNOSIS — Z170 Estrogen receptor positive status [ER+]: Secondary | ICD-10-CM | POA: Insufficient documentation

## 2014-11-06 DIAGNOSIS — Z79811 Long term (current) use of aromatase inhibitors: Secondary | ICD-10-CM | POA: Insufficient documentation

## 2014-11-06 DIAGNOSIS — Z9221 Personal history of antineoplastic chemotherapy: Secondary | ICD-10-CM | POA: Diagnosis not present

## 2014-11-06 DIAGNOSIS — C50912 Malignant neoplasm of unspecified site of left female breast: Secondary | ICD-10-CM | POA: Diagnosis present

## 2014-11-06 DIAGNOSIS — Z79899 Other long term (current) drug therapy: Secondary | ICD-10-CM | POA: Diagnosis not present

## 2014-11-06 DIAGNOSIS — Z853 Personal history of malignant neoplasm of breast: Secondary | ICD-10-CM

## 2014-11-06 MED ORDER — SODIUM CHLORIDE 0.9 % IJ SOLN
10.0000 mL | INTRAMUSCULAR | Status: DC | PRN
  Administered 2014-11-06: 10 mL via INTRAVENOUS
  Filled 2014-11-06: qty 10

## 2014-11-06 MED ORDER — HEPARIN SOD (PORK) LOCK FLUSH 100 UNIT/ML IV SOLN
INTRAVENOUS | Status: AC
  Filled 2014-11-06: qty 5

## 2014-11-06 MED ORDER — HEPARIN SOD (PORK) LOCK FLUSH 100 UNIT/ML IV SOLN
500.0000 [IU] | Freq: Once | INTRAVENOUS | Status: AC
  Administered 2014-11-06: 500 [IU] via INTRAVENOUS

## 2014-11-12 ENCOUNTER — Encounter: Payer: Self-pay | Admitting: Radiation Oncology

## 2014-11-12 ENCOUNTER — Ambulatory Visit
Admission: RE | Admit: 2014-11-12 | Discharge: 2014-11-12 | Disposition: A | Discharge status: Home / Self Care | Payer: Medicare Other | Source: Ambulatory Visit | Attending: Radiation Oncology | Admitting: Radiation Oncology

## 2014-11-12 VITALS — BP 139/70 | HR 81 | Temp 98.0°F | Resp 18 | Ht 63.0 in | Wt 162.0 lb

## 2014-11-12 DIAGNOSIS — C50912 Malignant neoplasm of unspecified site of left female breast: Secondary | ICD-10-CM

## 2014-11-12 NOTE — Progress Notes (Signed)
Radiation Oncology Follow up Note  Name: [name]   Date:   11/12/2014 MRN:  [id number] DOB: 13-Jan-1951    This 64 y.o. female presents to the clinic today for follow-up for breast cancer.  REFERRING PROVIDER: Hortencia Pilar, MD  HPI: Patient is a 64 year old female now out 1 year having completed radiation therapy for stage IIa (T1 cN1 M0) invasive mammary carcinoma the left breast status post neoadjuvant chemotherapy with complete response at time of reexcision. Tumor is ER/PR positive HER-2/neu  overexpressed. She has completed Herceptin.. She receives Avit adjuvant radiation therapy to her left breast and peripheral lymphatics now seen out 1 year and is doing well she scheduled for mammograms in August. They're being ordered by her surgeon. She specifically denies breast tenderness cough or bone pain. Patient has been on anastrozole not sure what aromatase inhibitor she is taking at this time. She specifically denies breast tenderness cough or bone pain.  COMPLICATIONS OF TREATMENT: none  FOLLOW UP COMPLIANCE: keeps appointments   PHYSICAL EXAM:  BP 139/70 mmHg  Pulse 81  Temp(Src) 98 F (36.7 C)  Resp 18  Ht '5\' 3"'  (1.6 m)  Wt 162 lb 0.6 oz (73.5 kg)  BMI 28.71 kg/m2 Well-developed female in NAD.Lungs are clear to A&P cardiac examination essentially unremarkable with regular rate and rhythm. No dominant mass or nodularity is noted in either breast in 2 positions examined. Incision is well-healed. No axillary or supraclavicular adenopathy is appreciated. Cosmetic result is excellent. Patient has a Port-A-Cath in her right anterior chest. No lymphedema is noted in the left upper extremity. Well-developed well-nourished patient in NAD. HEENT reveals PERLA, EOMI, discs not visualized.  Oral cavity is clear. No oral mucosal lesions are identified. Neck is clear without evidence of cervical or supraclavicular adenopathy. Lungs are clear to A&P. Cardiac examination is essentially  unremarkable with regular rate and rhythm without murmur rub or thrill. Abdomen is benign with no organomegaly or masses noted. Motor sensory and DTR levels are equal and symmetric in the upper and lower extremities. Cranial nerves II through XII are grossly intact. Proprioception is intact. No peripheral adenopathy or edema is identified. No motor or sensory levels are noted. Crude visual fields are within normal range.   RADIOLOGY RESULTS: Prior mammograms are reviewed  PLAN: At the present time she continues to do well with no evidence of disease. She's question me about removing her Port-A-Cath which I've asked her to address with medical oncology. She continues follow-up care with medical oncology. I have asked to see her back in 1 year for follow-up. Will also try to clarify her aromatase inhibitor therapy. Patient knows to call sooner with any concerns. She scheduled for follow-up mammograms in August.  I would like to take this opportunity for allowing me to participate in the care of your patient.Armstead Peaks., MD

## 2014-11-15 ENCOUNTER — Other Ambulatory Visit: Payer: Self-pay

## 2014-11-15 DIAGNOSIS — C50419 Malignant neoplasm of upper-outer quadrant of unspecified female breast: Secondary | ICD-10-CM

## 2014-11-16 ENCOUNTER — Inpatient Hospital Stay (HOSPITAL_BASED_OUTPATIENT_CLINIC_OR_DEPARTMENT_OTHER): Payer: Medicare Other | Admitting: Hematology and Oncology

## 2014-11-16 ENCOUNTER — Inpatient Hospital Stay: Payer: Medicare Other

## 2014-11-16 VITALS — BP 117/77 | HR 80 | Temp 95.9°F | Wt 162.5 lb

## 2014-11-16 DIAGNOSIS — C50912 Malignant neoplasm of unspecified site of left female breast: Secondary | ICD-10-CM | POA: Diagnosis not present

## 2014-11-16 DIAGNOSIS — Z170 Estrogen receptor positive status [ER+]: Secondary | ICD-10-CM | POA: Diagnosis not present

## 2014-11-16 DIAGNOSIS — C50419 Malignant neoplasm of upper-outer quadrant of unspecified female breast: Secondary | ICD-10-CM

## 2014-11-16 DIAGNOSIS — Z79811 Long term (current) use of aromatase inhibitors: Secondary | ICD-10-CM | POA: Diagnosis not present

## 2014-11-16 DIAGNOSIS — Z9221 Personal history of antineoplastic chemotherapy: Secondary | ICD-10-CM

## 2014-11-16 DIAGNOSIS — Z79899 Other long term (current) drug therapy: Secondary | ICD-10-CM

## 2014-11-16 DIAGNOSIS — C50412 Malignant neoplasm of upper-outer quadrant of left female breast: Secondary | ICD-10-CM

## 2014-11-16 LAB — COMPREHENSIVE METABOLIC PANEL
ALT: 16 U/L (ref 14–54)
AST: 22 U/L (ref 15–41)
Albumin: 4.2 g/dL (ref 3.5–5.0)
Alkaline Phosphatase: 61 U/L (ref 38–126)
Anion gap: 6 (ref 5–15)
BUN: 18 mg/dL (ref 6–20)
CO2: 26 mmol/L (ref 22–32)
Calcium: 9.2 mg/dL (ref 8.9–10.3)
Chloride: 103 mmol/L (ref 101–111)
Creatinine, Ser: 0.92 mg/dL (ref 0.44–1.00)
GFR calc Af Amer: >60 mL/min (ref >60)
GFR calc non Af Amer: >60 mL/min (ref >60)
Glucose, Bld: 117 mg/dL — ABNORMAL HIGH (ref 65–99)
Potassium: 4 mmol/L (ref 3.5–5.1)
Sodium: 135 mmol/L (ref 135–145)
Total Bilirubin: 0.4 mg/dL (ref 0.3–1.2)
Total Protein: 7.3 g/dL (ref 6.5–8.1)

## 2014-11-16 LAB — CBC
HCT: 38.1 % (ref 35.0–47.0)
Hemoglobin: 12.4 g/dL (ref 12.0–16.0)
MCH: 30.2 pg (ref 26.0–34.0)
MCHC: 32.7 g/dL (ref 32.0–36.0)
MCV: 92.4 fL (ref 80.0–100.0)
Platelets: 233 10*3/uL (ref 150–440)
RBC: 4.12 MIL/uL (ref 3.80–5.20)
RDW: 12.8 % (ref 11.5–14.5)
WBC: 6.1 10*3/uL (ref 3.6–11.0)

## 2014-11-16 MED ORDER — FULVESTRANT 250 MG/5ML IM SOLN
500.0000 mg | INTRAMUSCULAR | Status: AC
Start: 2014-11-16 — End: ?
  Administered 2014-11-16: 500 mg via INTRAMUSCULAR
  Filled 2014-11-16: qty 10

## 2014-11-23 ENCOUNTER — Other Ambulatory Visit: Payer: Self-pay

## 2014-11-23 ENCOUNTER — Ambulatory Visit: Payer: Self-pay | Admitting: Hematology and Oncology

## 2014-12-02 ENCOUNTER — Encounter: Payer: Self-pay | Admitting: Hematology and Oncology

## 2014-12-02 NOTE — Progress Notes (Signed)
[facility] day:  11/16/2014  Chief Complaint: [name] is an 64 y.o. 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  HPI: The patient was last seen in the medical oncology clinic on 10/18/2014.  At that time, she was seen for initial assessment by me. She had initially presented with a T1N1 Her2/neu positive breast cancer. She underwent neoadjuvant Adriamycin and Cytoxan (AC) followed by Taxol and Herceptin. She has achieved a complete pathologic remission. She received radiation.  Regarding hormonal therapy, she did not tolerate Femara or Arimidex. She developed blurry vision. When discussing tamoxifen, she states that she took it "years ago for 6 months".  Tamoxifen caused issues with constipation, nausea, vomiting, headache, blurred vision and change in mood. She states that "it was worse for me".  She has been on the Faslodex since 06/26/2014. She received Faslodex at my initial appointment with her on 10/18/2014. She has tolerated it well without side effects.  Symptomatically, she states that she feels pretty good most of the time.  She has periods of fatigue. She notes that the dose of her thyroid medication "keeps getting upped".  Past Medical History  Diagnosis Date  . Allergy   . Hepatitis A   . Cancer     uterine   . 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 ER+; PR -; HER-2/neu overexpression, ypT0,ypN0. complete pathological response.  . Breast cancer   . Uterine cancer     Past Surgical History  Procedure Laterality Date  . Abdominal hysterectomy  1979  . Back surgery    . Colonoscopy  2014  . Breast biopsy Left 2014  . Breast surgery Left 08-16-13    Needle/wire localization of Left breast   . Portacath placement  June 2015    Family History  Problem Relation Age of Onset  . Breast cancer  Mother     Social History:  reports that she has never smoked. She has never used smokeless tobacco. She reports that she does not drink alcohol or use illicit drugs.  The patient is alone today.  Allergies:  Allergies  Allergen Reactions  . Codeine Nausea And Vomiting    sick  . Other Other (See Comments)    congestion  . Tramadol Nausea And Vomiting and Itching  . Sulfa Antibiotics Rash and Itching    Current Medications: Current Outpatient Prescriptions  Medication Sig Dispense Refill  . ANASTROZOLE PO Take 1 tablet by mouth daily.    Marland Kitchen aspirin 81 MG tablet Take 81 mg by mouth daily.    . fulvestrant (FASLODEX) 250 MG/5ML injection Inject 250 mg into the muscle once. One injection each buttock over 1-2 minutes. Warm prior to use.    . levothyroxine (SYNTHROID, LEVOTHROID) 75 MCG tablet Take by mouth.    . Multiple Vitamins-Minerals (CENTRUM PO) Take by mouth.    . Pseudoephedrine HCl (SINUS & ALLERGY 12 HOUR PO) Take 1 tablet by mouth as needed.     . Calcium Carb-Cholecalciferol 600-200 MG-UNIT TABS Take 1 tablet by mouth 2 (two) times daily.    Marland Kitchen docusate sodium (COLACE) 100 MG capsule Take 200 mg by mouth daily.    Marland Kitchen FIBER SELECT GUMMIES PO Take 1 tablet by mouth as needed.    . pantoprazole (PROTONIX) 40 MG tablet Take 40 mg by mouth daily.  No current facility-administered medications for this visit.   Facility-Administered Medications Ordered in Other Visits  Medication Dose Route Frequency Provider Last Rate Last Dose  . fulvestrant (FASLODEX) injection 500 mg  500 mg Intramuscular Q30 days Lequita Asal, MD   500 mg at 11/16/14 1558    Review of Systems:  GENERAL:  Feels pretty good.  Usually  "a ball of energy; gets tired now".  No fevers, sweats or weight loss. PERFORMANCE STATUS (ECOG):  1 HEENT:  No visual changes, runny nose, sore throat, mouth sores or tenderness. Lungs: No shortness of breath or cough.  No hemoptysis. Cardiac:  No chest pain,  palpitations, orthopnea, or PND. GI:  No nausea, vomiting, diarrhea, constipation, melena or hematochezia. GU:  No urgency, frequency, dysuria, or hematuria. Musculoskeletal:  No back pain.  No joint pain.  No muscle tenderness. Extremities:  No pain or swelling. Skin:  No rashes or skin changes. Neuro:  No headache, numbness or weakness, balance or coordination issues. Endocrine:  Hypothyroid with dose adjustment in Synthroid.  No diabetes, hot flashes or night sweats. Psych:  No mood changes, depression or anxiety. Pain:  No focal pain. Review of systems:  All other systems reviewed and found to be negative.   Physical Exam: Blood pressure 117/77, pulse 80, temperature 95.9 F (35.5 C), temperature source Tympanic, weight 162 lb 7.7 oz (73.7 kg).  GENERAL:  Well developed, well nourished, sitting comfortably in the exam room in no acute distress. MENTAL STATUS:  Alert and oriented to person, place and time. HEAD:  Curly short black hair.  Normocephalic, atraumatic, face symmetric, no Cushingoid features. EYES:  Colored contacts.  Pupils equal round and reactive to light and accomodation.  No conjunctivitis or scleral icterus. ENT:  Oropharynx clear without lesion.  Tongue normal. Mucous membranes moist.  RESPIRATORY:  Clear to auscultation without rales, wheezes or rhonchi. CARDIOVASCULAR:  Regular rate and rhythm without murmur, rub or gallop. ABDOMEN:  Soft, non-tender, with active bowel sounds, and no hepatosplenomegaly.  No masses. SKIN:  No rashes, ulcers or lesions. EXTREMITIES: No edema, no skin discoloration or tenderness.  No palpable cords. LYMPH NODES: No palpable cervical, supraclavicular, axillary or inguinal adenopathy  NEUROLOGICAL: Unremarkable. PSYCH:  Appropriate.   Appointment on 11/16/2014  Component Date Value Ref Range Status  . WBC 11/16/2014 6.1  3.6 - 11.0 K/uL Final  . RBC 11/16/2014 4.12  3.80 - 5.20 MIL/uL Final  . Hemoglobin 11/16/2014 12.4  12.0 - 16.0  g/dL Final  . HCT 11/16/2014 38.1  35.0 - 47.0 % Final  . MCV 11/16/2014 92.4  80.0 - 100.0 fL Final  . MCH 11/16/2014 30.2  26.0 - 34.0 pg Final  . MCHC 11/16/2014 32.7  32.0 - 36.0 g/dL Final  . RDW 11/16/2014 12.8  11.5 - 14.5 % Final  . Platelets 11/16/2014 233  150 - 440 K/uL Final  . Sodium 11/16/2014 135  135 - 145 mmol/L Final  . Potassium 11/16/2014 4.0  3.5 - 5.1 mmol/L Final  . Chloride 11/16/2014 103  101 - 111 mmol/L Final  . CO2 11/16/2014 26  22 - 32 mmol/L Final  . Glucose, Bld 11/16/2014 117* 65 - 99 mg/dL Final  . BUN 11/16/2014 18  6 - 20 mg/dL Final  . Creatinine, Ser 11/16/2014 0.92  0.44 - 1.00 mg/dL Final  . Calcium 11/16/2014 9.2  8.9 - 10.3 mg/dL Final  . Total Protein 11/16/2014 7.3  6.5 - 8.1 g/dL Final  . Albumin 11/16/2014 4.2  3.5 - 5.0 g/dL Final  . AST 11/16/2014 22  15 - 41 U/L Final  . ALT 11/16/2014 16  14 - 54 U/L Final  . Alkaline Phosphatase 11/16/2014 61  38 - 126 U/L Final  . Total Bilirubin 11/16/2014 0.4  0.3 - 1.2 mg/dL Final  . GFR calc non Af Amer 11/16/2014 >60  >60 mL/min Final  . GFR calc Af Amer 11/16/2014 >60  >60 mL/min Final   Comment: (NOTE) The eGFR has been calculated using the CKD EPI equation. This calculation has not been validated in all clinical situations. eGFR's persistently <60 mL/min signify possible Chronic Kidney Disease.   . Anion gap 11/16/2014 6  5 - 15 Final    Assessment:  [name] is an 64 y.o. female with a history of a stage IIA (T1cN1M0) left breast cancer status post biopsy on 12/08/2012. Pathology revealed a grade III invasive mammary carcinoma which was ER/PR positive and HER-2/neu positive.  Lymph node biopsy revealed a 4 mm micrometastatic focus in a  2.2 cm lymph node.  She received 4 cycles of neoadjuvant Adriamycin and Cytoxan (AC) from 12/20/2012 until 02/27/2013 followed by 12 weeks of Taxol (03/21/2013 until 06/06/2013). She received Herceptin from 08/01/2013 until 07/03/2014.  She underwent  wide excision and sentinel lymph node biopsy on 07/03/2013.  Pathology revealed no residual carcinoma.  She began hormonal therapy with Femara then Arimidex. Because of blurred vision, she began Faslodex on 06/26/2014. She has received 5 injections to date (01/05, 01/20, 02/03, 03/03, 03/31, and 10/18/2014).    Prior bone density study revealed osteoporosis. She last received Reclast on 04/10/2014.  Symptomatically, she has periods of fatigue.  She is not interested in standard adjuvant hormonal therapy (aromatase inhibitors or tamoxifen).  Plan: 1. Labs today:  CBC with diff, CMP. 2. Discuss lack of long term data for adjuvant Faslodex.  Discuss use of Faslodex in the metastatic setting.  Discuss mechanism of action (estrogen receptor antagonist).  Discuss patient's experience with aromatase inhibitors.  Discuss consideration of Aromasin (may cause similar side effects).  Discuss tamoxifen and patient's prior experience. 3. Follow-up with breast cancer clinic at [facility] regarding use of Faslodex in this setting. 4. Faslodex today. 5. RTC in 1 month for MD assessment, labs, and +/- Faslodex.  Lequita Asal, MD  11/16/2014, 8:30 PM

## 2014-12-17 ENCOUNTER — Ambulatory Visit: Payer: Medicare Other | Admitting: Hematology and Oncology

## 2014-12-17 ENCOUNTER — Other Ambulatory Visit: Payer: Medicare Other

## 2014-12-17 ENCOUNTER — Ambulatory Visit: Payer: Medicare Other

## 2014-12-18 ENCOUNTER — Inpatient Hospital Stay: Payer: Medicare Other

## 2014-12-18 ENCOUNTER — Inpatient Hospital Stay: Payer: Medicare Other | Attending: Hematology and Oncology | Admitting: Oncology

## 2014-12-18 ENCOUNTER — Other Ambulatory Visit: Payer: Self-pay

## 2014-12-18 VITALS — BP 128/92 | HR 74 | Temp 96.9°F | Resp 20 | Wt 163.4 lb

## 2014-12-18 DIAGNOSIS — Z170 Estrogen receptor positive status [ER+]: Secondary | ICD-10-CM | POA: Diagnosis not present

## 2014-12-18 DIAGNOSIS — C50919 Malignant neoplasm of unspecified site of unspecified female breast: Secondary | ICD-10-CM

## 2014-12-18 DIAGNOSIS — Z79899 Other long term (current) drug therapy: Secondary | ICD-10-CM | POA: Insufficient documentation

## 2014-12-18 DIAGNOSIS — C50912 Malignant neoplasm of unspecified site of left female breast: Secondary | ICD-10-CM | POA: Insufficient documentation

## 2014-12-18 DIAGNOSIS — C773 Secondary and unspecified malignant neoplasm of axilla and upper limb lymph nodes: Secondary | ICD-10-CM | POA: Diagnosis not present

## 2014-12-18 DIAGNOSIS — Z79811 Long term (current) use of aromatase inhibitors: Secondary | ICD-10-CM | POA: Diagnosis not present

## 2014-12-18 LAB — CBC
HCT: 39.7 % (ref 35.0–47.0)
Hemoglobin: 13 g/dL (ref 12.0–16.0)
MCH: 30.1 pg (ref 26.0–34.0)
MCHC: 32.7 g/dL (ref 32.0–36.0)
MCV: 92.1 fL (ref 80.0–100.0)
Platelets: 234 10*3/uL (ref 150–440)
RBC: 4.31 MIL/uL (ref 3.80–5.20)
RDW: 12.6 % (ref 11.5–14.5)
WBC: 6.4 10*3/uL (ref 3.6–11.0)

## 2014-12-18 LAB — COMPREHENSIVE METABOLIC PANEL
ALT: 19 U/L (ref 14–54)
AST: 21 U/L (ref 15–41)
Albumin: 4.3 g/dL (ref 3.5–5.0)
Alkaline Phosphatase: 60 U/L (ref 38–126)
Anion gap: 6 (ref 5–15)
BUN: 23 mg/dL — ABNORMAL HIGH (ref 6–20)
CO2: 27 mmol/L (ref 22–32)
Calcium: 8.9 mg/dL (ref 8.9–10.3)
Chloride: 103 mmol/L (ref 101–111)
Creatinine, Ser: 1.01 mg/dL — ABNORMAL HIGH (ref 0.44–1.00)
GFR calc Af Amer: >60 mL/min (ref >60)
GFR calc non Af Amer: 58 mL/min — ABNORMAL LOW (ref >60)
Glucose, Bld: 99 mg/dL (ref 65–99)
Potassium: 4.7 mmol/L (ref 3.5–5.1)
Sodium: 136 mmol/L (ref 135–145)
Total Bilirubin: 0.4 mg/dL (ref 0.3–1.2)
Total Protein: 7.7 g/dL (ref 6.5–8.1)

## 2014-12-18 MED ORDER — LETROZOLE 2.5 MG PO TABS: 2.5000 mg | ORAL_TABLET | Freq: Every day | ORAL | Status: DC

## 2014-12-20 ENCOUNTER — Inpatient Hospital Stay: Payer: Medicare Other

## 2014-12-20 DIAGNOSIS — C50912 Malignant neoplasm of unspecified site of left female breast: Secondary | ICD-10-CM

## 2014-12-20 MED ORDER — FULVESTRANT 250 MG/5ML IM SOLN
500.0000 mg | INTRAMUSCULAR | Status: DC
  Administered 2014-12-20: 500 mg via INTRAMUSCULAR
  Filled 2014-12-20: qty 10

## 2014-12-25 NOTE — Progress Notes (Signed)
[name] day:  11/16/2014  Chief Complaint: [name] is an 64 y.o. 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  HPI: Patient returns to [name] today for further evaluation and discussion on whether to continue Faslodex or reinitiate an aromatase inhibitor. She currently feels well and is asymptomatic. She has no neurologic complaint. She denies any recent fevers or illnesses. She has no chest pain or shortness of breath. She denies any nausea, vomiting, constipation, or diarrhea. She has no urinary complaints. Patient feels at her baseline and offers no specific complaints today.   Past Medical History  Diagnosis Date  . Allergy   . Hepatitis A   . Cancer     uterine   . 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 ER+; PR -; HER-2/neu overexpression, ypT0,ypN0. complete pathological response.  . Breast cancer   . Uterine cancer     Past Surgical History  Procedure Laterality Date  . Abdominal hysterectomy  1979  . Back surgery    . Colonoscopy  2014  . Breast biopsy Left 2014  . Breast surgery Left 08-16-13    Needle/wire localization of Left breast   . Portacath placement  June 2015    Family History  Problem Relation Age of Onset  . Breast cancer Mother     Social History:  reports that she has never smoked. She has never used smokeless tobacco. She reports that she does not drink alcohol or use illicit drugs.  The patient is alone today.  Allergies:  Allergies  Allergen Reactions  . Codeine Nausea And Vomiting    sick  . Other Other (See Comments)    congestion  . Tramadol Nausea And Vomiting and Itching  . Sulfa Antibiotics Rash and Itching    Current Medications: Current Outpatient Prescriptions  Medication Sig Dispense Refill  . aspirin 81 MG tablet Take 81 mg by mouth daily.    .  Calcium Carb-Cholecalciferol 600-200 MG-UNIT TABS Take 1 tablet by mouth 2 (two) times daily.    Marland Kitchen docusate sodium (COLACE) 100 MG capsule Take 200 mg by mouth daily.    Marland Kitchen FIBER SELECT GUMMIES PO Take 1 tablet by mouth as needed.    . fulvestrant (FASLODEX) 250 MG/5ML injection Inject 250 mg into the muscle once. One injection each buttock over 1-2 minutes. Warm prior to use.    . letrozole (FEMARA) 2.5 MG tablet Take 1 tablet (2.5 mg total) by mouth daily. 30 tablet 11  . levothyroxine (SYNTHROID, LEVOTHROID) 75 MCG tablet Take by mouth.    . Multiple Vitamins-Minerals (CENTRUM PO) Take by mouth.    . pantoprazole (PROTONIX) 40 MG tablet Take 40 mg by mouth daily.    . Pseudoephedrine HCl (SINUS & ALLERGY 12 HOUR PO) Take 1 tablet by mouth as needed.     . ANASTROZOLE PO Take 1 tablet by mouth daily.     No current facility-administered medications for this visit.   Facility-Administered Medications Ordered in Other Visits  Medication Dose Route Frequency Provider Last Rate Last Dose  . fulvestrant (FASLODEX) injection 500 mg  500 mg Intramuscular Q30 days Lequita Asal, MD   500 mg at 11/16/14 1558    Review of Systems:  Performance status: ECOG 0 As per HPI. Otherwise, a complete review of systems is negative.    Physical  Exam: Blood pressure 128/92, pulse 74, temperature 96.9 F (36.1 C), temperature source Tympanic, resp. rate 20, weight 163 lb 5.8 oz (74.101 kg).  General: Well-developed, well-nourished, no acute distress. Eyes: anicteric sclera. Breasts: Exam deferred today. Lungs: Clear to auscultation bilaterally. Heart: Regular rate and rhythm. No rubs, murmurs, or gallops. Abdomen: Soft, nontender, nondistended. No organomegaly noted, normoactive bowel sounds. Musculoskeletal: No edema, cyanosis, or clubbing. Neuro: Alert, answering all questions appropriately. Cranial nerves grossly intact. Skin: No rashes or petechiae noted. Psych: Normal affect.   Appointment  on 12/18/2014  Component Date Value Ref Range Status  . WBC 12/18/2014 6.4  3.6 - 11.0 K/uL Final  . RBC 12/18/2014 4.31  3.80 - 5.20 MIL/uL Final  . Hemoglobin 12/18/2014 13.0  12.0 - 16.0 g/dL Final  . HCT 12/18/2014 39.7  35.0 - 47.0 % Final  . MCV 12/18/2014 92.1  80.0 - 100.0 fL Final  . MCH 12/18/2014 30.1  26.0 - 34.0 pg Final  . MCHC 12/18/2014 32.7  32.0 - 36.0 g/dL Final  . RDW 12/18/2014 12.6  11.5 - 14.5 % Final  . Platelets 12/18/2014 234  150 - 440 K/uL Final  . Sodium 12/18/2014 136  135 - 145 mmol/L Final  . Potassium 12/18/2014 4.7  3.5 - 5.1 mmol/L Final  . Chloride 12/18/2014 103  101 - 111 mmol/L Final  . CO2 12/18/2014 27  22 - 32 mmol/L Final  . Glucose, Bld 12/18/2014 99  65 - 99 mg/dL Final  . BUN 12/18/2014 23* 6 - 20 mg/dL Final  . Creatinine, Ser 12/18/2014 1.01* 0.44 - 1.00 mg/dL Final  . Calcium 12/18/2014 8.9  8.9 - 10.3 mg/dL Final  . Total Protein 12/18/2014 7.7  6.5 - 8.1 g/dL Final  . Albumin 12/18/2014 4.3  3.5 - 5.0 g/dL Final  . AST 12/18/2014 21  15 - 41 U/L Final  . ALT 12/18/2014 19  14 - 54 U/L Final  . Alkaline Phosphatase 12/18/2014 60  38 - 126 U/L Final  . Total Bilirubin 12/18/2014 0.4  0.3 - 1.2 mg/dL Final  . GFR calc non Af Amer 12/18/2014 58* >60 mL/min Final  . GFR calc Af Amer 12/18/2014 >60  >60 mL/min Final   Comment: (NOTE) The eGFR has been calculated using the CKD EPI equation. This calculation has not been validated in all clinical situations. eGFR's persistently <60 mL/min signify possible Chronic Kidney Disease.   Georgiann Hahn gap 12/18/2014 6  5 - 15 Final    Assessment:  [name] is an 63 y.o. female with a history of a stage IIA (T1cN1M0) left breast cancer status post biopsy on 12/08/2012. Pathology revealed a grade III invasive mammary carcinoma which was ER/PR positive and HER-2/neu positive.  Lymph node biopsy revealed a 4 mm micrometastatic focus in a  2.2 cm lymph node.  She received 4 cycles of neoadjuvant  Adriamycin and Cytoxan (AC) from 12/20/2012 until 02/27/2013 followed by 12 weeks of Taxol (03/21/2013 until 06/06/2013). She received Herceptin from 08/01/2013 until 07/03/2014.  She underwent wide excision and sentinel lymph node biopsy on 07/03/2013.  Pathology revealed no residual carcinoma.  She began hormonal therapy with Femara then Arimidex. Because of blurred vision, she began Faslodex on 06/26/2014. She has received 5 injections to date (01/05, 01/20, 02/03, 03/03, 03/31, and 10/18/2014).    Prior bone density study revealed osteoporosis. She last received Reclast on 04/10/2014.   Plan: 1.  Breast cancer: After lengthy discussion with the patient she is agreed  to discontinue Faslodex and reinitiate an aromatase inhibitor. If her blurry vision returns, we will discontinue altogether. Return to [name] in 3 months with repeat laboratory work and further evaluation. Patient first understanding and was in agreement with this plan.  Approximately 30 minutes was spent in discussion and consultation.     Lloyd Huger, MD  11/16/2014, 8:30 PM

## 2014-12-27 ENCOUNTER — Telehealth: Payer: Self-pay | Admitting: *Deleted

## 2014-12-27 NOTE — Telephone Encounter (Signed)
I called patient back after discussing with Dr Grayland Ormond and informed her that per Dr Grayland Ormond, she will no longer receive Faslodex injections and that she was to decide which of the two AI meds she has at home she will be taking and call us to let us know so we can update her record. She stated that there must have been a misunderstanding on her part and that she is out of town right now and does not have any of the meds with her, but when she gets home she will start the Letrozole. She also reports that she came in  On the 30th of June and got an injection ( this was an old appt with the meds already ordered and she decided to get it anyway) She also reports that her thyroid med is being adjusted right now and she is going every 6 weeks for labs and med adjustments and dealing with the side effects from that at the same time. We confirmed her upcoming appts for mammo [place] and MD and port flushes.

## 2015-01-01 ENCOUNTER — Telehealth: Payer: Self-pay

## 2015-01-01 ENCOUNTER — Inpatient Hospital Stay: Payer: Medicare Other | Attending: Oncology

## 2015-01-01 DIAGNOSIS — Z452 Encounter for adjustment and management of vascular access device: Secondary | ICD-10-CM | POA: Diagnosis not present

## 2015-01-01 DIAGNOSIS — Z170 Estrogen receptor positive status [ER+]: Secondary | ICD-10-CM | POA: Insufficient documentation

## 2015-01-01 DIAGNOSIS — C50912 Malignant neoplasm of unspecified site of left female breast: Secondary | ICD-10-CM | POA: Diagnosis present

## 2015-01-01 DIAGNOSIS — C801 Malignant (primary) neoplasm, unspecified: Secondary | ICD-10-CM

## 2015-01-01 MED ORDER — HEPARIN SOD (PORK) LOCK FLUSH 100 UNIT/ML IV SOLN
500.0000 [IU] | Freq: Once | INTRAVENOUS | Status: AC
  Administered 2015-01-01: 500 [IU] via INTRAVENOUS

## 2015-01-01 MED ORDER — HEPARIN SOD (PORK) LOCK FLUSH 100 UNIT/ML IV SOLN
INTRAVENOUS | Status: AC
  Filled 2015-01-01: qty 5

## 2015-01-01 MED ORDER — SODIUM CHLORIDE 0.9 % IJ SOLN
10.0000 mL | INTRAMUSCULAR | Status: DC | PRN
  Administered 2015-01-01: 10 mL via INTRAVENOUS
  Filled 2015-01-01: qty 10

## 2015-01-01 NOTE — Telephone Encounter (Signed)
Patient is wanting to know why she can't continue the Faslodex injections.  Dr. Grayland Ormond has spoken to her about the Faslodex on indicated for her diagnose at her previous appt.  Patient says she still doesn't understand why she can't continue injections and she does not tolerate any oral medications has taken the Letrozole for the past 2 days with s/e of headaches with body aches.  I reassured her what was discussed at last appt with Dr. Grayland Ormond also Dr. Alcus Dad and she will need to discuss any further concerns at her next appt with Dr. Grayland Ormond.  She is going to continue with the Letrozole and if the symptoms become intolerable she is to let us know.

## 2015-01-21 ENCOUNTER — Telehealth: Payer: Self-pay | Admitting: *Deleted

## 2015-01-21 NOTE — Telephone Encounter (Signed)
States she just left PMD office and was told her rash is from an allergic reaction from letrozole

## 2015-01-21 NOTE — Telephone Encounter (Signed)
Spoke with patient in office, patient instructed to stop letrozole, begin Prednisone taper as directed by pcp.

## 2015-01-28 ENCOUNTER — Other Ambulatory Visit: Payer: Self-pay | Admitting: Oncology

## 2015-01-28 ENCOUNTER — Ambulatory Visit: Payer: Medicare Other

## 2015-01-28 ENCOUNTER — Ambulatory Visit
Admission: RE | Admit: 2015-01-28 | Discharge: 2015-01-28 | Disposition: A | Discharge status: Home / Self Care | Payer: Medicare Other | Source: Ambulatory Visit | Attending: Oncology | Admitting: Oncology

## 2015-01-28 DIAGNOSIS — R928 Other abnormal and inconclusive findings on diagnostic imaging of breast: Secondary | ICD-10-CM | POA: Diagnosis not present

## 2015-01-28 DIAGNOSIS — C50912 Malignant neoplasm of unspecified site of left female breast: Secondary | ICD-10-CM

## 2015-01-28 DIAGNOSIS — Z853 Personal history of malignant neoplasm of breast: Secondary | ICD-10-CM | POA: Diagnosis not present

## 2015-01-29 ENCOUNTER — Inpatient Hospital Stay: Payer: Medicare Other | Attending: Oncology

## 2015-01-29 DIAGNOSIS — Z170 Estrogen receptor positive status [ER+]: Secondary | ICD-10-CM | POA: Diagnosis not present

## 2015-01-29 DIAGNOSIS — Z452 Encounter for adjustment and management of vascular access device: Secondary | ICD-10-CM | POA: Insufficient documentation

## 2015-01-29 DIAGNOSIS — C50912 Malignant neoplasm of unspecified site of left female breast: Secondary | ICD-10-CM | POA: Diagnosis not present

## 2015-01-29 DIAGNOSIS — C801 Malignant (primary) neoplasm, unspecified: Secondary | ICD-10-CM

## 2015-01-29 MED ORDER — HEPARIN SOD (PORK) LOCK FLUSH 100 UNIT/ML IV SOLN
INTRAVENOUS | Status: AC
  Filled 2015-01-29: qty 5

## 2015-01-29 MED ORDER — HEPARIN SOD (PORK) LOCK FLUSH 100 UNIT/ML IV SOLN
500.0000 [IU] | INTRAVENOUS | Status: AC | PRN
  Administered 2015-01-29: 500 [IU]

## 2015-01-29 MED ORDER — SODIUM CHLORIDE 0.9 % IJ SOLN
10.0000 mL | INTRAMUSCULAR | Status: AC | PRN
  Administered 2015-01-29: 10 mL
  Filled 2015-01-29: qty 10

## 2015-02-01 ENCOUNTER — Encounter: Payer: Self-pay | Admitting: Emergency Medicine

## 2015-02-01 ENCOUNTER — Emergency Department
Admission: EM | Admit: 2015-02-01 | Discharge: 2015-02-01 | Discharge status: Against Medical Advice | Payer: Medicare Other | Attending: Emergency Medicine | Admitting: Emergency Medicine

## 2015-02-01 DIAGNOSIS — R51 Headache: Secondary | ICD-10-CM | POA: Diagnosis not present

## 2015-02-01 DIAGNOSIS — R110 Nausea: Secondary | ICD-10-CM | POA: Diagnosis present

## 2015-02-01 LAB — CBC WITH DIFFERENTIAL/PLATELET
Basophils Absolute: 0.1 K/uL (ref 0–0.1)
Basophils Relative: 1 %
Eosinophils Absolute: 0.2 K/uL (ref 0–0.7)
Eosinophils Relative: 2 %
HCT: 44.3 % (ref 35.0–47.0)
Hemoglobin: 14.6 g/dL (ref 12.0–16.0)
Lymphocytes Relative: 14 %
Lymphs Abs: 1.5 K/uL (ref 1.0–3.6)
MCH: 30.4 pg (ref 26.0–34.0)
MCHC: 32.9 g/dL (ref 32.0–36.0)
MCV: 92.6 fL (ref 80.0–100.0)
Monocytes Absolute: 1.1 K/uL — ABNORMAL HIGH (ref 0.2–0.9)
Monocytes Relative: 11 %
Neutro Abs: 7.5 K/uL — ABNORMAL HIGH (ref 1.4–6.5)
Neutrophils Relative %: 72 %
Platelets: 222 K/uL (ref 150–440)
RBC: 4.78 MIL/uL (ref 3.80–5.20)
RDW: 13.5 % (ref 11.5–14.5)
WBC: 10.4 K/uL (ref 3.6–11.0)

## 2015-02-01 LAB — COMPREHENSIVE METABOLIC PANEL
ALT: 23 U/L (ref 14–54)
AST: 23 U/L (ref 15–41)
Albumin: 4 g/dL (ref 3.5–5.0)
Alkaline Phosphatase: 71 U/L (ref 38–126)
Anion gap: 9 (ref 5–15)
BILIRUBIN TOTAL: 0.2 mg/dL — AB (ref 0.3–1.2)
BUN: 20 mg/dL (ref 6–20)
CHLORIDE: 99 mmol/L — AB (ref 101–111)
CO2: 30 mmol/L (ref 22–32)
Calcium: 9.7 mg/dL (ref 8.9–10.3)
Creatinine, Ser: 1.08 mg/dL — ABNORMAL HIGH (ref 0.44–1.00)
GFR calc Af Amer: >60 mL/min (ref >60)
GFR, EST NON AFRICAN AMERICAN: 53 mL/min — AB (ref >60)
GLUCOSE: 92 mg/dL (ref 65–99)
Potassium: 4.2 mmol/L (ref 3.5–5.1)
SODIUM: 138 mmol/L (ref 135–145)
Total Protein: 7.5 g/dL (ref 6.5–8.1)

## 2015-02-01 NOTE — ED Notes (Signed)
Reports n/v for several days.  Today nausea and headache

## 2015-02-02 ENCOUNTER — Emergency Department: Payer: Medicare Other

## 2015-02-02 ENCOUNTER — Emergency Department
Admission: EM | Admit: 2015-02-02 | Discharge: 2015-02-02 | Disposition: A | Discharge status: Home / Self Care | Payer: Medicare Other | Attending: Emergency Medicine | Admitting: Emergency Medicine

## 2015-02-02 ENCOUNTER — Other Ambulatory Visit: Payer: Self-pay

## 2015-02-02 ENCOUNTER — Encounter: Payer: Self-pay | Admitting: *Deleted

## 2015-02-02 DIAGNOSIS — J189 Pneumonia, unspecified organism: Secondary | ICD-10-CM

## 2015-02-02 DIAGNOSIS — Z79899 Other long term (current) drug therapy: Secondary | ICD-10-CM | POA: Insufficient documentation

## 2015-02-02 DIAGNOSIS — R51 Headache: Secondary | ICD-10-CM | POA: Insufficient documentation

## 2015-02-02 DIAGNOSIS — R079 Chest pain, unspecified: Secondary | ICD-10-CM | POA: Diagnosis present

## 2015-02-02 DIAGNOSIS — Z7982 Long term (current) use of aspirin: Secondary | ICD-10-CM | POA: Diagnosis not present

## 2015-02-02 DIAGNOSIS — R000 Tachycardia, unspecified: Secondary | ICD-10-CM | POA: Insufficient documentation

## 2015-02-02 DIAGNOSIS — J159 Unspecified bacterial pneumonia: Secondary | ICD-10-CM | POA: Diagnosis not present

## 2015-02-02 LAB — COMPREHENSIVE METABOLIC PANEL
ALT: 22 U/L (ref 14–54)
ANION GAP: 9 (ref 5–15)
AST: 29 U/L (ref 15–41)
Albumin: 3.8 g/dL (ref 3.5–5.0)
Alkaline Phosphatase: 73 U/L (ref 38–126)
BILIRUBIN TOTAL: 0.4 mg/dL (ref 0.3–1.2)
BUN: 17 mg/dL (ref 6–20)
CALCIUM: 9.2 mg/dL (ref 8.9–10.3)
CO2: 26 mmol/L (ref 22–32)
Chloride: 97 mmol/L — ABNORMAL LOW (ref 101–111)
Creatinine, Ser: 0.98 mg/dL (ref 0.44–1.00)
GFR calc non Af Amer: 60 mL/min — ABNORMAL LOW (ref >60)
GLUCOSE: 105 mg/dL — AB (ref 65–99)
Potassium: 4.3 mmol/L (ref 3.5–5.1)
Sodium: 132 mmol/L — ABNORMAL LOW (ref 135–145)
Total Protein: 7.3 g/dL (ref 6.5–8.1)

## 2015-02-02 LAB — URINALYSIS COMPLETE WITH MICROSCOPIC (ARMC ONLY)
BACTERIA UA: NONE SEEN
BILIRUBIN URINE: NEGATIVE
GLUCOSE, UA: NEGATIVE mg/dL
Hgb urine dipstick: NEGATIVE
KETONES UR: NEGATIVE mg/dL
Leukocytes, UA: NEGATIVE
NITRITE: NEGATIVE
PROTEIN: NEGATIVE mg/dL
SPECIFIC GRAVITY, URINE: 1.018 (ref 1.005–1.030)
pH: 8 (ref 5.0–8.0)

## 2015-02-02 LAB — CBC WITH DIFFERENTIAL/PLATELET
BASOS PCT: 2 %
Basophils Absolute: 0.2 10*3/uL — ABNORMAL HIGH (ref 0–0.1)
EOS ABS: 0.1 10*3/uL (ref 0–0.7)
Eosinophils Relative: 1 %
HEMATOCRIT: 40 % (ref 35.0–47.0)
Hemoglobin: 13.4 g/dL (ref 12.0–16.0)
LYMPHS PCT: 5 %
Lymphs Abs: 0.6 10*3/uL — ABNORMAL LOW (ref 1.0–3.6)
MCH: 30.2 pg (ref 26.0–34.0)
MCHC: 33.4 g/dL (ref 32.0–36.0)
MCV: 90.5 fL (ref 80.0–100.0)
MONOS PCT: 8 %
Monocytes Absolute: 1 10*3/uL — ABNORMAL HIGH (ref 0.2–0.9)
Neutro Abs: 10.5 10*3/uL — ABNORMAL HIGH (ref 1.4–6.5)
Neutrophils Relative %: 84 %
PLATELETS: 195 10*3/uL (ref 150–440)
RBC: 4.42 MIL/uL (ref 3.80–5.20)
RDW: 13.2 % (ref 11.5–14.5)
WBC: 12.4 10*3/uL — AB (ref 3.6–11.0)

## 2015-02-02 LAB — LIPASE, BLOOD: Lipase: 23 U/L (ref 22–51)

## 2015-02-02 MED ORDER — SODIUM CHLORIDE 0.9 % IV BOLUS (SEPSIS)
1000.0000 mL | Freq: Once | INTRAVENOUS | Status: AC
  Administered 2015-02-02: 1000 mL via INTRAVENOUS

## 2015-02-02 MED ORDER — IBUPROFEN 600 MG PO TABS
ORAL_TABLET | ORAL | Status: AC
  Administered 2015-02-02: 600 mg via ORAL
  Filled 2015-02-02: qty 1

## 2015-02-02 MED ORDER — IBUPROFEN 600 MG PO TABS
600.0000 mg | ORAL_TABLET | Freq: Once | ORAL | Status: AC
  Administered 2015-02-02: 600 mg via ORAL

## 2015-02-02 MED ORDER — LEVOFLOXACIN 750 MG PO TABS: 750.0000 mg | ORAL_TABLET | Freq: Every day | ORAL | Status: AC

## 2015-02-02 MED ORDER — ONDANSETRON HCL 4 MG/2ML IJ SOLN
4.0000 mg | Freq: Once | INTRAMUSCULAR | Status: AC
  Administered 2015-02-02: 4 mg via INTRAVENOUS
  Filled 2015-02-02: qty 2

## 2015-02-02 NOTE — ED Notes (Signed)
Pt reports vomiting with a fever over the past week, pt reports chest tightness today

## 2015-02-02 NOTE — ED Notes (Signed)
Patient's R chest port accessed via 1" 20 gauge for IVF infusion and medications. Sterile technique used, site covered with transparent dressing.

## 2015-02-02 NOTE — ED Provider Notes (Signed)
[facility] Emergency Department Provider Note    ____________________________________________  Time seen: 0850  I have reviewed the triage vital signs and the nursing notes.   HISTORY  Chief Complaint Chest Pain   History limited by: Not Limited   HPI [name] is a 64 y.o. female who presents to the emergency department today with roughly 1 week history of fever, nausea and vomiting, body aches. Patient states the symptoms have been constant and progressive throughout the week. She states that she is not bringing up anything with her vomiting but is having dry heaves. She additionally states that she has had body aches and headaches. Has not been around anybody who has been sick that she is aware of. She has had fevers during this week.   Past Medical History  Diagnosis Date  . Allergy   . Hepatitis A   . Cancer 2014    left breast  . Cancer     uterine    Patient Active Problem List   Diagnosis Date Noted  . Breast cancer, female 05/09/2014  . Osteoporosis, unspecified 02/09/2014  . Malignant neoplasm of breast (female), unspecified site 06/27/2013  . Malignant neoplasm of upper-outer quadrant of female breast   . Malignant neoplasm of upper outer quadrant of female breast 12/08/2012  . Lymphadenopathy, axillary 12/08/2012  . Left breast mass 12/06/2012    Past Surgical History  Procedure Laterality Date  . Abdominal hysterectomy  1979  . Back surgery    . Colonoscopy  2014  . Breast surgery Left 08-16-13    Needle/wire localization of Left breast   . Portacath placement  June 2015  . Breast biopsy Left 2014    Current Outpatient Rx  Name  Route  Sig  Dispense  Refill  . aspirin 81 MG tablet   Oral   Take 81 mg by mouth daily.         . Calcium Carb-Cholecalciferol 600-200 MG-UNIT TABS   Oral   Take 1 tablet by mouth 2 (two) times daily.         Marland Kitchen docusate sodium (COLACE) 100 MG capsule   Oral   Take 200 mg by mouth  daily.         Marland Kitchen FIBER SELECT GUMMIES PO   Oral   Take 1 tablet by mouth as needed.         . fulvestrant (FASLODEX) 250 MG/5ML injection   Intramuscular   Inject 250 mg into the muscle once. One injection each buttock over 1-2 minutes. Warm prior to use.         . letrozole (FEMARA) 2.5 MG tablet   Oral   Take 1 tablet (2.5 mg total) by mouth daily.   30 tablet   11   . levothyroxine (SYNTHROID, LEVOTHROID) 75 MCG tablet   Oral   Take by mouth.         . Multiple Vitamins-Minerals (CENTRUM PO)   Oral   Take by mouth.         . pantoprazole (PROTONIX) 40 MG tablet   Oral   Take 40 mg by mouth daily.         . Pseudoephedrine HCl (SINUS & ALLERGY 12 HOUR PO)   Oral   Take 1 tablet by mouth as needed.            Allergies Codeine; Letrozole; Other; Tramadol; and Sulfa antibiotics  Family History  Problem Relation Age of Onset  . Breast cancer Mother  92    Social History Social History  Substance Use Topics  . Smoking status: Never Smoker   . Smokeless tobacco: Never Used  . Alcohol Use: No    Review of Systems  Constitutional: Positive for fever. Cardiovascular: Negative for chest pain. Respiratory: Negative for shortness of breath. Gastrointestinal: Negative for abdominal pain, vomiting and diarrhea. Genitourinary: Negative for dysuria. Musculoskeletal: Positive for body aches Skin: Negative for rash. Neurological: Negative for headaches, focal weakness or numbness.  10-point ROS otherwise negative.  ____________________________________________   PHYSICAL EXAM:  VITAL SIGNS: ED Triage Vitals  Enc Vitals Group     BP 02/02/15 0832 133/116 mmHg     Pulse Rate 02/02/15 0832 115     Resp 02/02/15 0832 20     Temp 02/02/15 0832 99.9 F (37.7 C)     Temp Source 02/02/15 0832 Oral     SpO2 02/02/15 0832 99 %     Weight 02/02/15 0832 165 lb (74.844 kg)     Height 02/02/15 0832 5\' 3"  (1.6 m)     Head Cir --      Peak Flow --       Pain Score 02/02/15 0832 3   Constitutional: Alert and oriented. Well appearing and in no distress. Eyes: Conjunctivae are normal. PERRL. Normal extraocular movements. ENT   Head: Normocephalic and atraumatic.   Nose: No congestion/rhinnorhea.   Mouth/Throat: Mucous membranes are moist.   Neck: No stridor. Hematological/Lymphatic/Immunilogical: No cervical lymphadenopathy. Cardiovascular: Tachycardic, regular rhythm.  No murmurs, rubs, or gallops. Respiratory: Normal respiratory effort without tachypnea nor retractions. Breath sounds are clear and equal bilaterally. No wheezes/rales/rhonchi. Gastrointestinal: Soft and nontender. No distention.  Genitourinary: Deferred Musculoskeletal: Normal range of motion in all extremities. No joint effusions.  No lower extremity tenderness nor edema. Neurologic:  Normal speech and language. No gross focal neurologic deficits are appreciated. Speech is normal.  Skin:  Skin is warm, dry and intact. No rash noted. Psychiatric: Mood and affect are normal. Speech and behavior are normal. Patient exhibits appropriate insight and judgment.  ____________________________________________    LABS (pertinent positives/negatives)  Labs Reviewed  CBC WITH DIFFERENTIAL/PLATELET - Abnormal; Notable for the following:    WBC 12.4 (*)    Neutro Abs 10.5 (*)    Lymphs Abs 0.6 (*)    Monocytes Absolute 1.0 (*)    Basophils Absolute 0.2 (*)    All other components within normal limits  COMPREHENSIVE METABOLIC PANEL - Abnormal; Notable for the following:    Sodium 132 (*)    Chloride 97 (*)    Glucose, Bld 105 (*)    GFR calc non Af Amer 60 (*)    All other components within normal limits  URINALYSIS COMPLETEWITH MICROSCOPIC ([facility] ONLY) - Abnormal; Notable for the following:    Color, Urine YELLOW (*)    APPearance CLEAR (*)    Squamous Epithelial / LPF 0-5 (*)    All other components within normal limits  LIPASE, BLOOD      ____________________________________________   EKG  I, Nance Pear, attending physician, personally viewed and interpreted this EKG  EKG Time: 0833 Rate: 111 Rhythm: sinus tachycardia Axis: normal Intervals: qtc 416 QRS: narrow ST changes: no st elevation  ____________________________________________    RADIOLOGY  CXR IMPRESSION: Mild opacity at the left lung base may represent atelectasis versus subtle infiltrate.  ____________________________________________   PROCEDURES  Procedure(s) performed: None  Critical Care performed: No  ____________________________________________   INITIAL IMPRESSION / ASSESSMENT AND PLAN / ED  COURSE  Pertinent labs & imaging results that were available during my care of the patient were reviewed by me and considered in my medical decision making (see chart for details).  Patient presented to the emergency department today with concerns for fever and nausea for roughly 1 week. On exam patient appeared mildly ill. No acute distress. Blood work shows a mild leukocytosis. In addition the chest x-ray is concerning for possible pneumonia. Will plan on discharging home with outpatient antibiotics for pneumonia.  ____________________________________________   FINAL CLINICAL IMPRESSION(S) / ED DIAGNOSES  Final diagnoses:  Community acquired pneumonia     Nance Pear, MD 02/02/15 1247

## 2015-02-02 NOTE — Discharge Instructions (Signed)
Please seek medical attention for any high fevers, chest pain, shortness of breath, change in behavior, persistent vomiting, bloody stool or any other new or concerning symptoms. ° ° °Pneumonia °Pneumonia is an infection of the lungs.  °CAUSES °Pneumonia may be caused by bacteria or a virus. Usually, these infections are caused by breathing infectious particles into the lungs (respiratory tract). °SIGNS AND SYMPTOMS  °· Cough. °· Fever. °· Chest pain. °· Increased rate of breathing. °· Wheezing. °· Mucus production. °DIAGNOSIS  °If you have the common symptoms of pneumonia, your health care provider will typically confirm the diagnosis with a chest X-ray. The X-ray will show an abnormality in the lung (pulmonary infiltrate) if you have pneumonia. Other tests of your blood, urine, or sputum may be done to find the specific cause of your pneumonia. Your health care provider may also do tests (blood gases or pulse oximetry) to see how well your lungs are working. °TREATMENT  °Some forms of pneumonia may be spread to other people when you cough or sneeze. You may be asked to wear a mask before and during your exam. Pneumonia that is caused by bacteria is treated with antibiotic medicine. Pneumonia that is caused by the influenza virus may be treated with an antiviral medicine. Most other viral infections must run their course. These infections will not respond to antibiotics.  °HOME CARE INSTRUCTIONS  °· Cough suppressants may be used if you are losing too much rest. However, coughing protects you by clearing your lungs. You should avoid using cough suppressants if you can. °· Your health care provider may have prescribed medicine if he or she thinks your pneumonia is caused by bacteria or influenza. Finish your medicine even if you start to feel better. °· Your health care provider may also prescribe an expectorant. This loosens the mucus to be coughed up. °· Take medicines only as directed by your health care  provider. °· Do not smoke. Smoking is a common cause of bronchitis and can contribute to pneumonia. If you are a smoker and continue to smoke, your cough may last several weeks after your pneumonia has cleared. °· A cold steam vaporizer or humidifier in your room or home may help loosen mucus. °· Coughing is often worse at night. Sleeping in a semi-upright position in a recliner or using a couple pillows under your head will help with this. °· Get rest as you feel it is needed. Your body will usually let you know when you need to rest. °PREVENTION °A pneumococcal shot (vaccine) is available to prevent a common bacterial cause of pneumonia. This is usually suggested for: °· People over 65 years old. °· Patients on chemotherapy. °· People with chronic lung problems, such as bronchitis or emphysema. °· People with immune system problems. °If you are over 65 or have a high risk condition, you may receive the pneumococcal vaccine if you have not received it before. In some countries, a routine influenza vaccine is also recommended. This vaccine can help prevent some cases of pneumonia. You may be offered the influenza vaccine as part of your care. °If you smoke, it is time to quit. You may receive instructions on how to stop smoking. Your health care provider can provide medicines and counseling to help you quit. °SEEK MEDICAL CARE IF: °You have a fever. °SEEK IMMEDIATE MEDICAL CARE IF:  °· Your illness becomes worse. This is especially true if you are elderly or weakened from any other disease. °· You cannot control your cough with   suppressants and are losing sleep. °· You begin coughing up blood. °· You develop pain which is getting worse or is uncontrolled with medicines. °· Any of the symptoms which initially brought you in for treatment are getting worse rather than better. °· You develop shortness of breath or chest pain. °MAKE SURE YOU:  °· Understand these instructions. °· Will watch your condition. °· Will get  help right away if you are not doing well or get worse. °Document Released: 06/08/2005 Document Revised: 10/23/2013 Document Reviewed: 08/28/2010 °[employer]® Patient Information ©2015 [employer]. This information is not intended to replace advice given to you by your health care provider. Make sure you discuss any questions you have with your health care provider. ° °

## 2015-02-02 NOTE — ED Notes (Signed)
R chest port access removed. No bleeding noted. Covered with gauze dressing.

## 2015-02-02 NOTE — ED Notes (Signed)
Patient in stable condition.

## 2015-02-05 ENCOUNTER — Encounter: Payer: Self-pay | Admitting: General Surgery

## 2015-02-05 ENCOUNTER — Ambulatory Visit (INDEPENDENT_AMBULATORY_CARE_PROVIDER_SITE_OTHER): Payer: Medicare Other | Admitting: General Surgery

## 2015-02-05 VITALS — BP 130/78 | HR 78 | Resp 14 | Ht 63.0 in | Wt 159.0 lb

## 2015-02-05 DIAGNOSIS — C50912 Malignant neoplasm of unspecified site of left female breast: Secondary | ICD-10-CM | POA: Diagnosis not present

## 2015-02-05 NOTE — Patient Instructions (Addendum)
Continue self breast exams. Call office for any new breast issues or concerns. Follow up in 6 months.   

## 2015-02-05 NOTE — Progress Notes (Signed)
Patient ID: [name], female   DOB: [birth year], 65 y.o.   MRN: [id number]  Chief Complaint  Patient presents with  . Follow-up    mammogram    HPI [name] is a 64 y.o. female here today for her six month breast cancer check up and follow up bilateral mammogram. Her most recent mammogram was on 01/28/15. She is currently being treated for pneumonia. She reports that she is feeling better since starting Levaquin on 02/02/15. She denies any new breast problems.    HPI  Past Medical History  Diagnosis Date  . Allergy   . Hepatitis A   . Cancer 2014    left breast  . Cancer     uterine    Past Surgical History  Procedure Laterality Date  . Abdominal hysterectomy  1979  . Back surgery    . Colonoscopy  2014  . Breast surgery Left 08-16-13    Needle/wire localization of Left breast   . Portacath placement  June 2015  . Breast biopsy Left 2014    Family History  Problem Relation Age of Onset  . Breast cancer Mother 53    Social History Social History  Substance Use Topics  . Smoking status: Never Smoker   . Smokeless tobacco: Never Used  . Alcohol Use: No    Allergies  Allergen Reactions  . Codeine Nausea And Vomiting    sick  . Letrozole Hives  . Other Other (See Comments)    congestion  . Sulfa Antibiotics Rash and Itching  . Tramadol Nausea And Vomiting, Itching and Rash    Current Outpatient Prescriptions  Medication Sig Dispense Refill  . aspirin 81 MG tablet Take 81 mg by mouth daily.    Marland Kitchen docusate sodium (COLACE) 100 MG capsule Take 200 mg by mouth daily.    Marland Kitchen levofloxacin (LEVAQUIN) 750 MG tablet Take 1 tablet (750 mg total) by mouth daily. 7 tablet 0  . levothyroxine (SYNTHROID, LEVOTHROID) 75 MCG tablet Take by mouth.    . Multiple Vitamins-Minerals (CENTRUM PO) Take by mouth.    . ondansetron (ZOFRAN-ODT) 8 MG disintegrating tablet Take 8 mg by mouth as needed.     No current facility-administered medications for this visit.    Facility-Administered Medications Ordered in Other Visits  Medication Dose Route Frequency Provider Last Rate Last Dose  . fulvestrant (FASLODEX) injection 500 mg  500 mg Intramuscular Q30 days Lequita Asal, MD   500 mg at 11/16/14 1558    Review of Systems Review of Systems  Constitutional: Positive for fatigue. Negative for fever, chills, diaphoresis, activity change, appetite change and unexpected weight change.  Respiratory: Positive for cough. Negative for apnea, choking, chest tightness, shortness of breath, wheezing and stridor.   Cardiovascular: Negative.     Blood pressure 130/78, pulse 78, resp. rate 14, height 5\' 3"  (1.6 m), weight 159 lb (72.122 kg), SpO2 98 %.  Physical Exam Physical Exam  Constitutional: She is oriented to person, place, and time. She appears well-developed and well-nourished.  Neck: Neck supple.  Cardiovascular: Normal rate and normal heart sounds.   Pulmonary/Chest: Effort normal. She has rhonchi in the left lower field. Right breast exhibits no inverted nipple, no mass, no nipple discharge, no skin change and no tenderness. Left breast exhibits tenderness (as scar). Left breast exhibits no inverted nipple, no mass, no nipple discharge and no skin change.  Lymphadenopathy:    She has no cervical adenopathy.    She has no axillary  adenopathy.  Neurological: She is alert and oriented to person, place, and time.  Skin: Skin is warm and dry.  Psychiatric: She has a normal mood and affect.    Data Reviewed Recent chest x-ray of 02/01/2015 showed some haziness in the left base. Electrocardiogram obtained showing the same ED visit showed sinus tachycardia.  Mammograms dated 01/28/2015 showed dystrophic calcifications in the area of the previous surgery. A 6 month follow-up was recommended. BI-RADS-3. These films were independently reviewed.  Assessment    Resolving left lower lobe pneumonia.  Left breast cancer without evidence of recurrence,  now 18 months postop.  Poor tolerance of tamoxifen, Aromasin and letrozole.    Plan    The patient reports that she has not been satisfied with the care provided by Dr. Mike Gip or Grayland Ormond. She is scheduled to see Dr. Ma Hillock next month. She reports developing a rash to letrozole requiring prednisone. Anastrozole causes dizziness and tamoxifen caused unacceptable emotional lability. She had a 10% ER positive tumor, and while it would be ideal to take advantage of that small positivity, it may not be reasonable considering her poor response to multiple medications. She will discuss this with Dr. Ma Hillock at her upcoming visit.     PCP:  Elisabeth Cara 02/06/2015, 6:35 AM

## 2015-03-12 ENCOUNTER — Inpatient Hospital Stay: Payer: Medicare Other | Attending: Oncology

## 2015-03-12 DIAGNOSIS — C50912 Malignant neoplasm of unspecified site of left female breast: Secondary | ICD-10-CM | POA: Insufficient documentation

## 2015-03-12 DIAGNOSIS — Z452 Encounter for adjustment and management of vascular access device: Secondary | ICD-10-CM | POA: Insufficient documentation

## 2015-03-12 DIAGNOSIS — C801 Malignant (primary) neoplasm, unspecified: Secondary | ICD-10-CM

## 2015-03-12 DIAGNOSIS — Z170 Estrogen receptor positive status [ER+]: Secondary | ICD-10-CM | POA: Diagnosis not present

## 2015-03-12 MED ORDER — HEPARIN SOD (PORK) LOCK FLUSH 100 UNIT/ML IV SOLN
500.0000 [IU] | Freq: Once | INTRAVENOUS | Status: AC
  Administered 2015-03-12: 500 [IU] via INTRAVENOUS

## 2015-03-12 MED ORDER — HEPARIN SOD (PORK) LOCK FLUSH 100 UNIT/ML IV SOLN
INTRAVENOUS | Status: AC
  Filled 2015-03-12: qty 5

## 2015-03-12 MED ORDER — SODIUM CHLORIDE 0.9 % IJ SOLN
10.0000 mL | INTRAMUSCULAR | Status: DC | PRN
  Administered 2015-03-12: 10 mL via INTRAVENOUS
  Filled 2015-03-12: qty 10

## 2015-03-20 ENCOUNTER — Ambulatory Visit: Payer: Medicare Other | Admitting: Oncology

## 2015-03-20 ENCOUNTER — Other Ambulatory Visit: Payer: Self-pay | Admitting: *Deleted

## 2015-03-20 ENCOUNTER — Other Ambulatory Visit: Payer: Medicare Other

## 2015-03-20 ENCOUNTER — Inpatient Hospital Stay: Payer: Medicare Other

## 2015-03-20 DIAGNOSIS — C50912 Malignant neoplasm of unspecified site of left female breast: Secondary | ICD-10-CM | POA: Diagnosis not present

## 2015-03-20 LAB — HEPATIC FUNCTION PANEL
ALT: 18 U/L (ref 14–54)
AST: 21 U/L (ref 15–41)
Albumin: 4.2 g/dL (ref 3.5–5.0)
Alkaline Phosphatase: 62 U/L (ref 38–126)
Bilirubin, Direct: <0.1 mg/dL — ABNORMAL LOW (ref 0.1–0.5)
TOTAL PROTEIN: 7.4 g/dL (ref 6.5–8.1)
Total Bilirubin: 0.4 mg/dL (ref 0.3–1.2)

## 2015-03-20 LAB — CBC WITH DIFFERENTIAL/PLATELET
BASOS ABS: 0.1 10*3/uL (ref 0–0.1)
Basophils Relative: 1 %
EOS ABS: 0.2 10*3/uL (ref 0–0.7)
EOS PCT: 3 %
HCT: 39.6 % (ref 35.0–47.0)
Hemoglobin: 13.2 g/dL (ref 12.0–16.0)
Lymphocytes Relative: 36 %
Lymphs Abs: 2.1 10*3/uL (ref 1.0–3.6)
MCH: 30.1 pg (ref 26.0–34.0)
MCHC: 33.2 g/dL (ref 32.0–36.0)
MCV: 90.6 fL (ref 80.0–100.0)
MONO ABS: 0.7 10*3/uL (ref 0.2–0.9)
Monocytes Relative: 12 %
Neutro Abs: 2.8 10*3/uL (ref 1.4–6.5)
Neutrophils Relative %: 48 %
PLATELETS: 247 10*3/uL (ref 150–440)
RBC: 4.38 MIL/uL (ref 3.80–5.20)
RDW: 13.8 % (ref 11.5–14.5)
WBC: 5.9 10*3/uL (ref 3.6–11.0)

## 2015-03-20 LAB — CREATININE, SERUM
Creatinine, Ser: 1 mg/dL (ref 0.44–1.00)
GFR, EST NON AFRICAN AMERICAN: 58 mL/min — AB (ref >60)

## 2015-03-21 ENCOUNTER — Other Ambulatory Visit: Payer: Medicare Other

## 2015-03-21 ENCOUNTER — Inpatient Hospital Stay: Payer: Medicare Other | Admitting: Internal Medicine

## 2015-03-22 ENCOUNTER — Ambulatory Visit: Payer: Medicare Other | Admitting: Internal Medicine

## 2015-03-22 ENCOUNTER — Other Ambulatory Visit: Payer: Medicare Other

## 2015-03-26 ENCOUNTER — Encounter: Payer: Self-pay | Admitting: *Deleted

## 2015-03-27 ENCOUNTER — Other Ambulatory Visit: Payer: Self-pay | Admitting: *Deleted

## 2015-03-27 ENCOUNTER — Inpatient Hospital Stay: Payer: Medicare Other | Attending: Internal Medicine | Admitting: Internal Medicine

## 2015-03-27 VITALS — BP 135/77 | HR 84 | Temp 97.5°F | Ht 63.0 in | Wt 169.9 lb

## 2015-03-27 DIAGNOSIS — J449 Chronic obstructive pulmonary disease, unspecified: Secondary | ICD-10-CM | POA: Insufficient documentation

## 2015-03-27 DIAGNOSIS — Z7982 Long term (current) use of aspirin: Secondary | ICD-10-CM | POA: Insufficient documentation

## 2015-03-27 DIAGNOSIS — C50912 Malignant neoplasm of unspecified site of left female breast: Secondary | ICD-10-CM | POA: Diagnosis not present

## 2015-03-27 DIAGNOSIS — C50412 Malignant neoplasm of upper-outer quadrant of left female breast: Secondary | ICD-10-CM

## 2015-03-27 DIAGNOSIS — Z9221 Personal history of antineoplastic chemotherapy: Secondary | ICD-10-CM | POA: Insufficient documentation

## 2015-03-27 DIAGNOSIS — Z452 Encounter for adjustment and management of vascular access device: Secondary | ICD-10-CM | POA: Insufficient documentation

## 2015-03-27 DIAGNOSIS — Z79899 Other long term (current) drug therapy: Secondary | ICD-10-CM | POA: Insufficient documentation

## 2015-03-27 DIAGNOSIS — Z8542 Personal history of malignant neoplasm of other parts of uterus: Secondary | ICD-10-CM | POA: Diagnosis not present

## 2015-03-27 DIAGNOSIS — M8580 Other specified disorders of bone density and structure, unspecified site: Secondary | ICD-10-CM | POA: Diagnosis not present

## 2015-03-27 DIAGNOSIS — E039 Hypothyroidism, unspecified: Secondary | ICD-10-CM | POA: Diagnosis not present

## 2015-03-27 DIAGNOSIS — Z170 Estrogen receptor positive status [ER+]: Secondary | ICD-10-CM | POA: Diagnosis not present

## 2015-03-27 MED ORDER — EXEMESTANE 25 MG PO TABS: 25.0000 mg | ORAL_TABLET | Freq: Every day | ORAL | Status: DC

## 2015-03-27 NOTE — Progress Notes (Signed)
Patient here for follow up breast CA.

## 2015-03-27 NOTE — Progress Notes (Signed)
RN present with md for breast exam on patient.

## 2015-03-27 NOTE — Progress Notes (Signed)
[place] OFFICE PROGRESS NOTE  Patient Care Team: Hortencia Pilar, MD as PCP - General (Family Medicine) Robert Bellow, MD (General Surgery) Forest Gleason, MD (Unknown Physician Specialty)   SUMMARY OF ONCOLOGIC HISTORY:  # 2014- LEFT BREAST CA [STAGE IIAT1cN1[61m] ER/PR/Her-2 NEU POS s/p NEO-ADJ CHEMO [ddAC-T;finished [id number] Herceptin x1y; finished JAN 2016]; s/p LUMPEC & SLNBx; path CR; s/p RT  # ADJ AI- Intol to Femara/Arimidex; START Faslodex [Jan 2016 thru April 2016]; June 2016- START     INTERVAL HISTORY:  This is my first interaction with the patient since I joined the practice September 2016. I reviewed the patient's prior chart/pertinent labs/imaging in detail; findings are summarized.  Pleasant 64year old female patient with above history of stage II left-sided breast cancer currently not adjuvant antihormone therapy is here for follow-up.   Patient had multiple intolerances to antihormone therapy [Femara; Arimidex and tamoxifen] intermittent skin rashes; blurry vision; dizziness etc. Patient has not been on any antihormone therapy for the last 3 months.  Patient denies any unusual weight loss or shortness of breath. She has an ongoing cough that has not gotten better over the last 2 months. It is productive with whitish sputum.   Appetite is good. No nausea no vomiting. No weight loss in fact weight gain.  REVIEW OF SYSTEMS:  A complete 10 point review of system is done which is negative except mentioned above/history of present illness.   PAST MEDICAL HISTORY :  Past Medical History  Diagnosis Date  . Allergy   . Hepatitis A 1988  . [place]    left breast, stage 2a, Her2/neu +  . [place]   . History of chemotherapy     Adriamycin/Cytoxan 12/20/2012-02/27/2013; 12 weekly cycles of Taxol from 03/21/2013-06/06/2013  . History of radiation therapy     09/13/2013-11/08/2013  . Osteopenia   . Hypothyroid   .  COPD (chronic obstructive pulmonary disease) ([facility]    seen on chest xray  . History of mammogram 01/2014    PAST SURGICAL HISTORY :   Past Surgical History  Procedure Laterality Date  . Abdominal hysterectomy  1979  . Spinal fusion      x 3  . Colonoscopy  2014  . Breast surgery Left 08-16-13    Needle/wire localization of Left breast   . Portacath placement  2014  . Breast biopsy Left 2014  . Carpal tunnel release    . Colonoscopy  2014    FAMILY HISTORY :   Family History  Problem Relation Age of Onset  . Breast cancer Mother 445   SOCIAL HISTORY:   Social History  Substance Use Topics  . Smoking status: Never Smoker   . Smokeless tobacco: Never Used  . Alcohol Use: No    ALLERGIES:  is allergic to prednisone; codeine; letrozole; other; prednisone; sulfa antibiotics; and tramadol.  MEDICATIONS:  Current Outpatient Prescriptions  Medication Sig Dispense Refill  . aspirin 81 MG tablet Take 81 mg by mouth daily.    .Marland Kitchendocusate sodium (COLACE) 100 MG capsule Take 200 mg by mouth daily.    .Marland Kitchenlevothyroxine (SYNTHROID, LEVOTHROID) 75 MCG tablet Take by mouth.    . Multiple Vitamins-Minerals (CENTRUM PO) Take by mouth.    . COMBIVENT RESPIMAT 20-100 MCG/ACT AERS respimat Inhale 1 Inhaler into the lungs daily as needed.     No current facility-administered medications for this visit.   Facility-Administered Medications Ordered in Other Visits  Medication Dose  Route Frequency Provider Last Rate Last Dose  . fulvestrant (FASLODEX) injection 500 mg  500 mg Intramuscular Q30 days Lequita Asal, MD   500 mg at 11/16/14 1558    PHYSICAL EXAMINATION: ECOG PERFORMANCE STATUS: 0 - Asymptomatic  BP 135/77 mmHg  Pulse 84  Temp(Src) 97.5 F (36.4 C) (Tympanic)  Ht '5\' 3"'  (1.6 m)  Wt 169 lb 13.8 oz (77.05 kg)  BMI 30.10 kg/m2  SpO2 98%  Filed Weights   03/27/15 1336  Weight: 169 lb 13.8 oz (77.05 kg)    GENERAL: Well-nourished well-developed; Alert, no distress  and comfortable.   Alone. EYES: no pallor or icterus OROPHARYNX: no thrush or ulceration; good dentition  NECK: supple, no masses felt LYMPH:  no palpable lymphadenopathy in the cervical, axillary or inguinal regions LUNGS: clear to auscultation and  No wheeze or crackles HEART/CVS: regular rate & rhythm and no murmurs; No lower extremity edema ABDOMEN:abdomen soft, non-tender and normal bowel sounds Musculoskeletal:no cyanosis of digits and no clubbing  PSYCH: alert & oriented x 3 with fluent speech NEURO: no focal motor/sensory deficits SKIN:  no rashes or significant lesions BREAST EXAM [done in presence of RN]: LEFT BREAST- lumpectomy changes noted in the 3:00 position; 2 cm hard knot felt [mobile; chronic per patient]. Right breast exam- within normal limits. No skin rashes.  LABORATORY DATA:  I have reviewed the data as listed    Component Value Date/Time   NA 132* 02/02/2015 0933   NA 135 10/18/2014 1311   K 4.3 02/02/2015 0933   K 4.4 10/18/2014 1311   CL 97* 02/02/2015 0933   CL 103 10/18/2014 1311   CO2 26 02/02/2015 0933   CO2 25 10/18/2014 1311   GLUCOSE 105* 02/02/2015 0933   GLUCOSE 89 10/18/2014 1311   BUN 17 02/02/2015 0933   BUN 24* 10/18/2014 1311   CREATININE 1.00 03/20/2015 1528   CREATININE 0.90 10/18/2014 1311   CALCIUM 9.2 02/02/2015 0933   CALCIUM 9.3 10/18/2014 1311   PROT 7.4 03/20/2015 1528   PROT 7.3 10/18/2014 1311   ALBUMIN 4.2 03/20/2015 1528   ALBUMIN 4.4 10/18/2014 1311   AST 21 03/20/2015 1528   AST 20 10/18/2014 1311   ALT 18 03/20/2015 1528   ALT 16 10/18/2014 1311   ALKPHOS 62 03/20/2015 1528   ALKPHOS 50 10/18/2014 1311   BILITOT 0.4 03/20/2015 1528   BILITOT 0.4 10/18/2014 1311   GFRNONAA 58* 03/20/2015 1528   GFRNONAA >60 10/18/2014 1311   GFRNONAA 55* 07/03/2014 1326   GFRAA >60 03/20/2015 1528   GFRAA >60 10/18/2014 1311   GFRAA >60 07/03/2014 1326    No results found for: SPEP, UPEP  Lab Results  Component Value Date    WBC 5.9 03/20/2015   NEUTROABS 2.8 03/20/2015   HGB 13.2 03/20/2015   HCT 39.6 03/20/2015   MCV 90.6 03/20/2015   PLT 247 03/20/2015      Chemistry      Component Value Date/Time   NA 132* 02/02/2015 0933   NA 135 10/18/2014 1311   K 4.3 02/02/2015 0933   K 4.4 10/18/2014 1311   CL 97* 02/02/2015 0933   CL 103 10/18/2014 1311   CO2 26 02/02/2015 0933   CO2 25 10/18/2014 1311   BUN 17 02/02/2015 0933   BUN 24* 10/18/2014 1311   CREATININE 1.00 03/20/2015 1528   CREATININE 0.90 10/18/2014 1311      Component Value Date/Time   CALCIUM 9.2 02/02/2015 0933   CALCIUM  9.3 10/18/2014 1311   ALKPHOS 62 03/20/2015 1528   ALKPHOS 50 10/18/2014 1311   AST 21 03/20/2015 1528   AST 20 10/18/2014 1311   ALT 18 03/20/2015 1528   ALT 16 10/18/2014 1311   BILITOT 0.4 03/20/2015 1528   BILITOT 0.4 10/18/2014 1311       RADIOGRAPHIC STUDIES: I have personally reviewed the radiological images as listed and agreed with the findings in the report. No results found.   ASSESSMENT & PLAN:   # BREAST CANCER LEFT STAGE IIA [T1N1]-ER/PR positive HER-2/neu positive status post chemotherapy-complete pathologic response. Patient is currently on adjuvant antihormone therapy. Clinically based on history/review of system; physical exam- no evidence of recurrence noted.  # I discussed with the patient the importance of continued antihormone therapy; Given the multiple intolerances to prior antihormone therapies- Arimidex/Femara/tamoxifen- I would recommend a trial of Aromasin.  Prescription given. I also discussed the potential side effects including but not limited to arthralgias heart flashes.  # Ongoing cough/ atelectasis vs infiltrate noted on a chest x-ray in August 2016- with a history of breast cancer; I would recommend CT of the chest without contrast.  # With regards to the Mediport- we will make decisions whether to keep it or take it out with the CT results are available.     No  orders of the defined types were placed in this encounter.   All questions were answered. The patient knows to call the clinic with any problems, questions or concerns. No barriers to learning was detected. I spent 25 minutes counseling the patient face to face. The total time spent in the appointment was 30 minutes and more than 50% was on counseling and review of test results     Cammie Sickle, MD 03/27/2015 1:53 PM

## 2015-04-01 ENCOUNTER — Ambulatory Visit
Admission: RE | Admit: 2015-04-01 | Discharge: 2015-04-01 | Disposition: A | Discharge status: Home / Self Care | Payer: Medicare Other | Source: Ambulatory Visit | Attending: Internal Medicine | Admitting: Internal Medicine

## 2015-04-01 DIAGNOSIS — C50412 Malignant neoplasm of upper-outer quadrant of left female breast: Secondary | ICD-10-CM | POA: Diagnosis not present

## 2015-04-01 DIAGNOSIS — R05 Cough: Secondary | ICD-10-CM | POA: Diagnosis present

## 2015-04-10 ENCOUNTER — Ambulatory Visit: Payer: Medicare Other | Admitting: Internal Medicine

## 2015-04-11 ENCOUNTER — Inpatient Hospital Stay (HOSPITAL_BASED_OUTPATIENT_CLINIC_OR_DEPARTMENT_OTHER): Payer: Medicare Other | Admitting: Internal Medicine

## 2015-04-11 ENCOUNTER — Encounter: Payer: Self-pay | Admitting: Internal Medicine

## 2015-04-11 ENCOUNTER — Inpatient Hospital Stay: Payer: Medicare Other

## 2015-04-11 VITALS — BP 145/77 | HR 56 | Temp 97.0°F | Ht 63.0 in | Wt 175.0 lb

## 2015-04-11 DIAGNOSIS — Z170 Estrogen receptor positive status [ER+]: Secondary | ICD-10-CM

## 2015-04-11 DIAGNOSIS — Z79899 Other long term (current) drug therapy: Secondary | ICD-10-CM

## 2015-04-11 DIAGNOSIS — C50912 Malignant neoplasm of unspecified site of left female breast: Secondary | ICD-10-CM | POA: Diagnosis not present

## 2015-04-11 DIAGNOSIS — Z8542 Personal history of malignant neoplasm of other parts of uterus: Secondary | ICD-10-CM | POA: Diagnosis not present

## 2015-04-11 DIAGNOSIS — C801 Malignant (primary) neoplasm, unspecified: Secondary | ICD-10-CM

## 2015-04-11 DIAGNOSIS — Z9221 Personal history of antineoplastic chemotherapy: Secondary | ICD-10-CM

## 2015-04-11 MED ORDER — HEPARIN SOD (PORK) LOCK FLUSH 100 UNIT/ML IV SOLN
INTRAVENOUS | Status: AC
  Filled 2015-04-11: qty 5

## 2015-04-11 MED ORDER — SODIUM CHLORIDE 0.9 % IJ SOLN
10.0000 mL | INTRAMUSCULAR | Status: DC | PRN
  Administered 2015-04-11: 10 mL via INTRAVENOUS
  Filled 2015-04-11: qty 10

## 2015-04-11 MED ORDER — HEPARIN SOD (PORK) LOCK FLUSH 100 UNIT/ML IV SOLN
500.0000 [IU] | Freq: Once | INTRAVENOUS | Status: AC
  Administered 2015-04-11: 500 [IU] via INTRAVENOUS

## 2015-04-11 NOTE — Progress Notes (Signed)
[place] OFFICE PROGRESS NOTE  Patient Care Team: Hortencia Pilar, MD as PCP - General (Family Medicine) Robert Bellow, MD (General Surgery) Forest Gleason, MD (Unknown Physician Specialty)   SUMMARY OF ONCOLOGIC HISTORY:  # 2014- LEFT BREAST CA [STAGE IIAT1cN1[8m] ER/PR/Her-2 NEU POS s/p NEO-ADJ CHEMO [ddAC-T;finished [id number] Herceptin x1y; finished JAN 2016]; s/p LUMPEC & SLNBx; path CR; s/p RT  # ADJ AI- Intol to Femara/Arimidex; START Faslodex [Jan 2016 thru April 2016]; OCT 2016- START AROMASIN    INTERVAL HISTORY:  Pleasant 64year old female patient with above history of stage II left-sided breast cancer currently not adjuvant antihormone therapy is here for follow-up.   Patient had multiple intolerances to antihormone therapy [Femara; Arimidex and tamoxifen] intermittent skin rashes; blurry vision; dizziness etc. patient has been started on Aromasin 2 weeks ago.  Patient complains of ongoing muscle aches and body aches/chronic. Appetite is good. No nausea no vomiting. No weight loss in fact weight gain.  Patient complains of recent swelling in the arm from her pneumonia vaccination.  REVIEW OF SYSTEMS:  A complete 10 point review of system is done which is negative except mentioned above/history of present illness.   PAST MEDICAL HISTORY :  Past Medical History  Diagnosis Date  . Allergy   . Hepatitis A 1988  . History of chemotherapy     Adriamycin/Cytoxan 12/20/2012-02/27/2013; 12 weekly cycles of Taxol from 03/21/2013-06/06/2013  . History of radiation therapy     09/13/2013-11/08/2013  . Osteopenia   . Hypothyroid   . COPD ([place] obstructive [place] disease[place]    seen on chest xray  . History of mammogram 01/2014  . [place] [place]    left breast, stage 2a, Her2/neu +  . [place] [place]   . [place]     PAST SURGICAL HISTORY :   Past Surgical History  Procedure Laterality Date  . Abdominal  hysterectomy  1979  . Spinal fusion      x 3  . Colonoscopy  2014  . Breast surgery Left 08-16-13    Needle/wire localization of Left breast   . Portacath placement  2014  . Breast biopsy Left 2014  . Carpal tunnel release    . Colonoscopy  2014    FAMILY HISTORY :   Family History  Problem Relation Age of Onset  . Breast cancer Mother 435   SOCIAL HISTORY:   Social History  Substance Use Topics  . Smoking status: Never Smoker   . Smokeless tobacco: Never Used  . Alcohol Use: No    ALLERGIES:  is allergic to prednisone; codeine; letrozole; other; prednisone; sulfa antibiotics; and tramadol.  MEDICATIONS:  Current Outpatient Prescriptions  Medication Sig Dispense Refill  . aspirin 81 MG tablet Take 81 mg by mouth daily.    .Marland Kitchendocusate sodium (COLACE) 100 MG capsule Take 200 mg by mouth daily.    .Marland Kitchenexemestane (AROMASIN) 25 MG tablet Take 1 tablet (25 mg total) by mouth daily after breakfast. 90 tablet 1  . levothyroxine (SYNTHROID, LEVOTHROID) 75 MCG tablet Take by mouth.    . Multiple Vitamins-Minerals (CENTRUM PO) Take by mouth.    . COMBIVENT RESPIMAT 20-100 MCG/ACT AERS respimat Inhale 1 Inhaler into the lungs daily as needed.     Current Facility-Administered Medications  Medication Dose Route Frequency Provider Last Rate Last Dose  . sodium chloride 0.9 % injection 10 mL  10 mL Intravenous PRN LEvlyn Kanner NP   10  mL at 04/11/15 1506   Facility-Administered Medications Ordered in Other Visits  Medication Dose Route Frequency Provider Last Rate Last Dose  . fulvestrant (FASLODEX) injection 500 mg  500 mg Intramuscular Q30 days Lequita Asal, MD   500 mg at 11/16/14 1558    PHYSICAL EXAMINATION: ECOG PERFORMANCE STATUS: 0 - Asymptomatic  BP 145/77 mmHg  Pulse 56  Temp(Src) 97 F (36.1 C) (Tympanic)  Ht '5\' 3"'  (1.6 m)  Wt 175 lb (79.379 kg)  BMI 31.01 kg/m2  SpO2 98%  Filed Weights   04/11/15 1529  Weight: 175 lb (79.379 kg)    GENERAL:  Well-nourished well-developed; Alert, no distress and comfortable.   Alone.  LABORATORY DATA:  I have reviewed the data as listed    Component Value Date/Time   NA 132* 02/02/2015 0933   NA 135 10/18/2014 1311   K 4.3 02/02/2015 0933   K 4.4 10/18/2014 1311   CL 97* 02/02/2015 0933   CL 103 10/18/2014 1311   CO2 26 02/02/2015 0933   CO2 25 10/18/2014 1311   GLUCOSE 105* 02/02/2015 0933   GLUCOSE 89 10/18/2014 1311   BUN 17 02/02/2015 0933   BUN 24* 10/18/2014 1311   CREATININE 1.00 03/20/2015 1528   CREATININE 0.90 10/18/2014 1311   CALCIUM 9.2 02/02/2015 0933   CALCIUM 9.3 10/18/2014 1311   PROT 7.4 03/20/2015 1528   PROT 7.3 10/18/2014 1311   ALBUMIN 4.2 03/20/2015 1528   ALBUMIN 4.4 10/18/2014 1311   AST 21 03/20/2015 1528   AST 20 10/18/2014 1311   ALT 18 03/20/2015 1528   ALT 16 10/18/2014 1311   ALKPHOS 62 03/20/2015 1528   ALKPHOS 50 10/18/2014 1311   BILITOT 0.4 03/20/2015 1528   BILITOT 0.4 10/18/2014 1311   GFRNONAA 58* 03/20/2015 1528   GFRNONAA >60 10/18/2014 1311   GFRNONAA 55* 07/03/2014 1326   GFRAA >60 03/20/2015 1528   GFRAA >60 10/18/2014 1311   GFRAA >60 07/03/2014 1326    No results found for: SPEP, UPEP  Lab Results  Component Value Date   WBC 5.9 03/20/2015   NEUTROABS 2.8 03/20/2015   HGB 13.2 03/20/2015   HCT 39.6 03/20/2015   MCV 90.6 03/20/2015   PLT 247 03/20/2015      Chemistry      Component Value Date/Time   NA 132* 02/02/2015 0933   NA 135 10/18/2014 1311   K 4.3 02/02/2015 0933   K 4.4 10/18/2014 1311   CL 97* 02/02/2015 0933   CL 103 10/18/2014 1311   CO2 26 02/02/2015 0933   CO2 25 10/18/2014 1311   BUN 17 02/02/2015 0933   BUN 24* 10/18/2014 1311   CREATININE 1.00 03/20/2015 1528   CREATININE 0.90 10/18/2014 1311      Component Value Date/Time   CALCIUM 9.2 02/02/2015 0933   CALCIUM 9.3 10/18/2014 1311   ALKPHOS 62 03/20/2015 1528   ALKPHOS 50 10/18/2014 1311   AST 21 03/20/2015 1528   AST 20 10/18/2014  1311   ALT 18 03/20/2015 1528   ALT 16 10/18/2014 1311   BILITOT 0.4 03/20/2015 1528   BILITOT 0.4 10/18/2014 1311       RADIOGRAPHIC STUDIES: I have personally reviewed the radiological images as listed and agreed with the findings in the report. No results found.   ASSESSMENT & PLAN:   # BREAST CANCER LEFT STAGE IIA [T1N1]-ER/PR positive HER-2/neu positive status post chemotherapy-complete pathologic response. Patient is currently on adjuvant antihormone therapy with Aromasin. Clinically  based on history/review of system; physical exam- no evidence of recurrence noted.  # I recommend taking vitamin D thousand units with calcium once a day to help with the arthralgias and myalgias/also prevent bone loss with the AI.  # CT scan of the chest-shows a Mild haziness in the lingula; no evidence of any concern for any malignancy. Question radiation induced.   # With regards to the [place] patient had a complete pathologic response post chemotherapy I think she should be cured of her cancer in the majority of the times. I think it's reasonable to have the port taken out.   Patient follow-up with me in approximately 3 months.     Orders Placed This Encounter  Procedures  . Schedule Portacath Flush Appointment    Schedule Portacath flush appointment   All questions were answered. The patient knows to call the clinic with any problems, questions or concerns. No barriers to learning was detected.  I spent 15 minutes counseling the patient face to face. The total time spent in the appointment was 30 minutes and more than 50% was on counseling and review of test results     Cammie Sickle, MD 04/11/2015 3:41 PM

## 2015-04-11 NOTE — Progress Notes (Signed)
Patient here for follow up breast CA. Patient recently had a severe reaction to pneumonia vaccine. She is also experiencing bone pain. She has to medicate with Ibuprofin several times a day. She is experiencing bouts of lethargy.

## 2015-04-12 ENCOUNTER — Telehealth: Payer: Self-pay | Admitting: *Deleted

## 2015-04-12 NOTE — Telephone Encounter (Signed)
Left message for patient that an appointment has been made with Dr. Bary Castilla to have her port removed on November 16th at 9:15.  Patient should arrive at 9 am.  She should take insurance card, list of medications and be prepared to pay her copay.

## 2015-05-08 ENCOUNTER — Encounter: Payer: Self-pay | Admitting: General Surgery

## 2015-05-08 ENCOUNTER — Ambulatory Visit (INDEPENDENT_AMBULATORY_CARE_PROVIDER_SITE_OTHER): Payer: Medicare Other | Admitting: General Surgery

## 2015-05-08 VITALS — BP 130/74 | HR 84 | Resp 14 | Ht 63.0 in | Wt 174.0 lb

## 2015-05-08 DIAGNOSIS — C50912 Malignant neoplasm of unspecified site of left female breast: Secondary | ICD-10-CM

## 2015-05-08 NOTE — Progress Notes (Signed)
Patient ID: [name], female   DOB: 19-May-1951, 64 y.o.   MRN: [id number]  Chief Complaint  Patient presents with  . Procedure    HPI [name] is a 64 y.o. female here today for a port removal.  HPI  Past Medical History  Diagnosis Date  . Allergy   . Hepatitis A 1988  . History of chemotherapy     Adriamycin/Cytoxan 12/20/2012-02/27/2013; 12 weekly cycles of Taxol from 03/21/2013-06/06/2013  . History of radiation therapy     09/13/2013-11/08/2013  . Osteopenia   . Hypothyroid   . COPD (chronic obstructive pulmonary disease) ([place]) 2014    seen on chest xray  . History of mammogram 01/2014  . Breast cancer, left ([place]) 12/08/2012    left breast, stage 2a, Her2/neu +  . Uterine cancer ([place])   . Breast cancer [facility])     Past Surgical History  Procedure Laterality Date  . Abdominal hysterectomy  1979  . Spinal fusion      x 3  . Colonoscopy  2014  . Breast surgery Left 08-16-13    Needle/wire localization of Left breast   . Portacath placement  2014  . Breast biopsy Left 2014  . Carpal tunnel release    . Colonoscopy  2014    Family History  Problem Relation Age of Onset  . Breast cancer Mother 27    Social History Social History  Substance Use Topics  . Smoking status: Never Smoker   . Smokeless tobacco: Never Used  . Alcohol Use: No    Allergies  Allergen Reactions  . Prednisone Rash  . Codeine Nausea And Vomiting    sick  . Letrozole Hives  . Other Other (See Comments)    congestion  . Prednisone Rash  . Sulfa Antibiotics Rash and Itching  . Tramadol Nausea And Vomiting, Itching and Rash    Current Outpatient Prescriptions  Medication Sig Dispense Refill  . aspirin 81 MG tablet Take 81 mg by mouth daily.    . cholecalciferol (VITAMIN D) 1000 UNITS tablet Take 1,000 Units by mouth 2 (two) times daily.    . COMBIVENT RESPIMAT 20-100 MCG/ACT AERS respimat Inhale 1 Inhaler into the lungs daily as needed.    . docusate sodium (COLACE) 100 MG capsule  Take 200 mg by mouth daily.    Marland Kitchen exemestane (AROMASIN) 25 MG tablet Take 1 tablet (25 mg total) by mouth daily after breakfast. 90 tablet 1  . levothyroxine (SYNTHROID, LEVOTHROID) 75 MCG tablet Take by mouth.    . Multiple Vitamins-Minerals (CENTRUM PO) Take by mouth.     No current facility-administered medications for this visit.   Facility-Administered Medications Ordered in Other Visits  Medication Dose Route Frequency Provider Last Rate Last Dose  . fulvestrant (FASLODEX) injection 500 mg  500 mg Intramuscular Q30 days Lequita Asal, MD   500 mg at 11/16/14 1558    Review of Systems Review of Systems  Constitutional: Negative.   Respiratory: Negative.   Cardiovascular: Negative.     Blood pressure 130/74, pulse 84, resp. rate 14, height $RemoveBe'5\' 3"'BIJxCCyhS$  (1.6 m), weight 174 lb (78.926 kg).  Physical Exam Physical Exam  Pulmonary/Chest:      Data Reviewed Request for port removal received.  Assessment    Candidate for PowerPort removal.    Plan    The procedure was reviewed with the patient. She was amenable to proceed. 10 mL of 0.5% Xylocaine with 0.25% Marcaine with 1-200,000 units of epinephrine was  used. This was then supplemented with 3 mL of 1% Xylocaine plain. ChloraPrep was applied to the skin. The original incision was opened. The port was exposed. Previously placed fixation sutures were removed. The port was gently extracted with an intact tip. The wound was closed in layers with running 3-0 Vicryl to the adipose layer and a running 3-0 Vicryl septic suture for the skin. Benzoin, Steri-Strips, Telfa and Tegaderm dressing applied.  Post procedure wound care reviewed with the patient.       Robert Bellow 05/10/2015, 1:30 PM

## 2015-05-08 NOTE — Patient Instructions (Addendum)
Keep area clean. You may shower. Return in one week for nurse check.

## 2015-05-09 ENCOUNTER — Other Ambulatory Visit: Payer: Self-pay

## 2015-05-09 DIAGNOSIS — C50912 Malignant neoplasm of unspecified site of left female breast: Secondary | ICD-10-CM

## 2015-05-15 ENCOUNTER — Ambulatory Visit (INDEPENDENT_AMBULATORY_CARE_PROVIDER_SITE_OTHER): Payer: Medicare Other | Admitting: *Deleted

## 2015-05-15 DIAGNOSIS — C50912 Malignant neoplasm of unspecified site of left female breast: Secondary | ICD-10-CM

## 2015-05-15 NOTE — Progress Notes (Signed)
Patient came in today for a wound check.  The wound is clean, with no signs of infection noted. .Follow up as scheduled.  

## 2015-05-22 ENCOUNTER — Encounter: Payer: Self-pay | Admitting: *Deleted

## 2015-07-12 ENCOUNTER — Inpatient Hospital Stay: Payer: Medicare Other | Attending: Internal Medicine | Admitting: Internal Medicine

## 2015-07-12 VITALS — BP 138/61 | HR 50 | Temp 98.2°F | Resp 18 | Wt 177.2 lb

## 2015-07-12 DIAGNOSIS — Z79899 Other long term (current) drug therapy: Secondary | ICD-10-CM

## 2015-07-12 DIAGNOSIS — J449 Chronic obstructive pulmonary disease, unspecified: Secondary | ICD-10-CM | POA: Diagnosis not present

## 2015-07-12 DIAGNOSIS — J069 Acute upper respiratory infection, unspecified: Secondary | ICD-10-CM

## 2015-07-12 DIAGNOSIS — Z79811 Long term (current) use of aromatase inhibitors: Secondary | ICD-10-CM | POA: Diagnosis not present

## 2015-07-12 DIAGNOSIS — C50411 Malignant neoplasm of upper-outer quadrant of right female breast: Secondary | ICD-10-CM

## 2015-07-12 DIAGNOSIS — E039 Hypothyroidism, unspecified: Secondary | ICD-10-CM | POA: Diagnosis not present

## 2015-07-12 DIAGNOSIS — Z170 Estrogen receptor positive status [ER+]: Secondary | ICD-10-CM | POA: Diagnosis not present

## 2015-07-12 DIAGNOSIS — Z7982 Long term (current) use of aspirin: Secondary | ICD-10-CM | POA: Diagnosis not present

## 2015-07-12 DIAGNOSIS — Z8542 Personal history of malignant neoplasm of other parts of uterus: Secondary | ICD-10-CM | POA: Diagnosis not present

## 2015-07-12 DIAGNOSIS — C50912 Malignant neoplasm of unspecified site of left female breast: Secondary | ICD-10-CM | POA: Diagnosis present

## 2015-07-12 MED ORDER — ERGOCALCIFEROL 1.25 MG (50000 UT) PO CAPS: 50000.0000 [IU] | ORAL_CAPSULE | ORAL | Status: DC

## 2015-07-12 MED ORDER — AZITHROMYCIN 250 MG PO TABS: ORAL_TABLET | ORAL | Status: DC

## 2015-07-12 NOTE — Progress Notes (Signed)
[facility] OFFICE PROGRESS NOTE  Patient Care Team: Hortencia Pilar, MD as PCP - General (Family Medicine) Robert Bellow, MD (General Surgery) Forest Gleason, MD (Unknown Physician Specialty)   SUMMARY OF ONCOLOGIC HISTORY:  # 2014- LEFT BREAST CA [STAGE IIAT1cN1[40m] ER/PR/Her-2 NEU POS s/p NEO-ADJ CHEMO [ddAC-T;finished [id number] Herceptin x1y; finished JAN 2016]; s/p LUMPEC & SLNBx; path CR; s/p RT  # ADJ AI- Intol to Femara/Arimidex; START Faslodex [Jan 2016 thru April 2016]; OCT 2016- START AROMASIN    INTERVAL HISTORY:  [name] 65year old female patient with above history of stage II left-sided breast cancer currently on adjuvant antihormone therapy is here for follow-up.  Patient complains of runny nose sinus congestion and intermittent wheezing. She feels her ears are plugged. Going on for the last 10 days or so.  Patient continues to have intermittent joint pains muscle pain- on Aromasin. However this is not as intense as on other aromatase inhibitors.    Denies any weight loss. Denies any headaches. Denies any other bone pain.  REVIEW OF SYSTEMS:  A complete 10 point review of system is done which is negative except mentioned above/history of present illness.   PAST MEDICAL HISTORY :  Past Medical History  Diagnosis Date  . Allergy   . Hepatitis A 1988  . History of chemotherapy     Adriamycin/Cytoxan 12/20/2012-02/27/2013; 12 weekly cycles of Taxol from 03/21/2013-06/06/2013  . History of radiation therapy     09/13/2013-11/08/2013  . Osteopenia   . Hypothyroid   . COPD (ch[place] obstructive [place] disease[place] 2014    seen on chest xray  . History of mammogram 01/2014  . Breast cancer[place] 12/[place]2012    left breast, stage 2a, Her2/neu +  . [place]   . [place]     PAST SURGICAL HISTORY :   Past Surgical History  Procedure Laterality Date  . Abdominal hysterectomy  1979  . Spinal fusion      x 3  . Colonoscopy   2014  . Breast surgery Left 08-16-13    Needle/wire localization of Left breast   . Portacath placement  2014  . Breast biopsy Left 2014  . Carpal tunnel release    . Colonoscopy  2014    FAMILY HISTORY :   Family History  Problem Relation Age of Onset  . Breast cancer Mother 415   SOCIAL HISTORY:   Social History  Substance Use Topics  . Smoking status: Never Smoker   . Smokeless tobacco: Never Used  . Alcohol Use: No    ALLERGIES:  is allergic to prednisone; codeine; letrozole; other; prednisone; sulfa antibiotics; and tramadol.  MEDICATIONS:  Current Outpatient Prescriptions  Medication Sig Dispense Refill  . aspirin 81 MG tablet Take 81 mg by mouth daily.    . cholecalciferol (VITAMIN D) 1000 UNITS tablet Take 1,000 Units by mouth 2 (two) times daily.    . COMBIVENT RESPIMAT 20-100 MCG/ACT AERS respimat Inhale 1 Inhaler into the lungs daily as needed.    . docusate sodium (COLACE) 100 MG capsule Take 200 mg by mouth daily.    .Marland Kitchenexemestane (AROMASIN) 25 MG tablet Take 1 tablet (25 mg total) by mouth daily after breakfast. 90 tablet 1  . levothyroxine (SYNTHROID, LEVOTHROID) 75 MCG tablet Take by mouth.    . Multiple Vitamins-Minerals (CENTRUM PO) Take by mouth.     No current facility-administered medications for this visit.   Facility-Administered Medications Ordered in Other Visits  Medication  Dose Route Frequency Provider Last Rate Last Dose  . fulvestrant (FASLODEX) injection 500 mg  500 mg Intramuscular Q30 days Lequita Asal, MD   500 mg at 11/16/14 1558    PHYSICAL EXAMINATION: ECOG PERFORMANCE STATUS: 0 - Asymptomatic  BP 138/61 mmHg  Pulse 50  Temp(Src) 98.2 F (36.8 C) (Tympanic)  Resp 18  Wt 177 lb 4 oz (80.4 kg)  Filed Weights   07/12/15 1426  Weight: 177 lb 4 oz (80.4 kg)    GENERAL: Well-nourished well-developed; Alert, no distress and comfortable. Alone. EYES: no pallor or icterus OROPHARYNX: no thrush or ulceration; good dentition   NECK: supple, no masses felt LYMPH: no palpable lymphadenopathy in the cervical, axillary or inguinal regions LUNGS: clear to auscultation and No wheeze or crackles HEART/CVS: regular rate & rhythm and no murmurs; No lower extremity edema ABDOMEN:abdomen soft, non-tender and normal bowel sounds Musculoskeletal:no cyanosis of digits and no clubbing  PSYCH: alert & oriented x 3 with fluent speech NEURO: no focal motor/sensory deficits SKIN: no rashes or significant lesions  LABORATORY DATA:  I have reviewed the data as listed    Component Value Date/Time   NA 132* 02/02/2015 0933   NA 135 10/18/2014 1311   K 4.3 02/02/2015 0933   K 4.4 10/18/2014 1311   CL 97* 02/02/2015 0933   CL 103 10/18/2014 1311   CO2 26 02/02/2015 0933   CO2 25 10/18/2014 1311   GLUCOSE 105* 02/02/2015 0933   GLUCOSE 89 10/18/2014 1311   BUN 17 02/02/2015 0933   BUN 24* 10/18/2014 1311   CREATININE 1.00 03/20/2015 1528   CREATININE 0.90 10/18/2014 1311   CALCIUM 9.2 02/02/2015 0933   CALCIUM 9.3 10/18/2014 1311   PROT 7.4 03/20/2015 1528   PROT 7.3 10/18/2014 1311   ALBUMIN 4.2 03/20/2015 1528   ALBUMIN 4.4 10/18/2014 1311   AST 21 03/20/2015 1528   AST 20 10/18/2014 1311   ALT 18 03/20/2015 1528   ALT 16 10/18/2014 1311   ALKPHOS 62 03/20/2015 1528   ALKPHOS 50 10/18/2014 1311   BILITOT 0.4 03/20/2015 1528   BILITOT 0.4 10/18/2014 1311   GFRNONAA 58* 03/20/2015 1528   GFRNONAA >60 10/18/2014 1311   GFRNONAA 55* 07/03/2014 1326   GFRAA >60 03/20/2015 1528   GFRAA >60 10/18/2014 1311   GFRAA >60 07/03/2014 1326    No results found for: SPEP, UPEP  Lab Results  Component Value Date   WBC 5.9 03/20/2015   NEUTROABS 2.8 03/20/2015   HGB 13.2 03/20/2015   HCT 39.6 03/20/2015   MCV 90.6 03/20/2015   PLT 247 03/20/2015      Chemistry      Component Value Date/Time   NA 132* 02/02/2015 0933   NA 135 10/18/2014 1311   K 4.3 02/02/2015 0933   K 4.4 10/18/2014 1311   CL 97*  02/02/2015 0933   CL 103 10/18/2014 1311   CO2 26 02/02/2015 0933   CO2 25 10/18/2014 1311   BUN 17 02/02/2015 0933   BUN 24* 10/18/2014 1311   CREATININE 1.00 03/20/2015 1528   CREATININE 0.90 10/18/2014 1311      Component Value Date/Time   CALCIUM 9.2 02/02/2015 0933   CALCIUM 9.3 10/18/2014 1311   ALKPHOS 62 03/20/2015 1528   ALKPHOS 50 10/18/2014 1311   AST 21 03/20/2015 1528   AST 20 10/18/2014 1311   ALT 18 03/20/2015 1528   ALT 16 10/18/2014 1311   BILITOT 0.4 03/20/2015 1528   BILITOT  0.4 10/18/2014 1311       RADIOGRAPHIC STUDIES: I have personally reviewed the radiological images as listed and agreed with the findings in the report. No results found.   ASSESSMENT & PLAN:   # BREAST CANCER LEFT STAGE IIA [T1N1]-ER/PR positive HER-2/neu positive status post chemotherapy-complete pathologic response. Patient is currently on adjuvant antihormone therapy with Aromasin. Clinically based on history/review of system; physical exam- no evidence of recurrence noted.  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 Recommend taking 50,000 units of ergocalciferol once a week. Prescription given.  # Upper respiratory tract infection- recommend Z-Pak.  # Follow-up in 3 months.  I spent 15 minutes counseling the patient face to face. The total time spent in the appointment was 30 minutes and more than 50% was on counseling and review of test results     Cammie Sickle, MD 07/12/2015 2:54 PM

## 2015-07-12 NOTE — Progress Notes (Signed)
Patient states she has head congestion, sore throat and cough. Wants to know if MD will prescribe something to help?  Patient states she is fatigued.  States the longer she is on exemestane the more fatigues she becomes.  Patient is experiencing exertional SOB and some mild constipation that she takes stool softener for.

## 2015-07-30 ENCOUNTER — Ambulatory Visit
Admission: RE | Admit: 2015-07-30 | Discharge: 2015-07-30 | Disposition: A | Discharge status: Home / Self Care | Payer: Medicare Other | Source: Ambulatory Visit | Attending: General Surgery | Admitting: General Surgery

## 2015-07-30 DIAGNOSIS — Z853 Personal history of malignant neoplasm of breast: Secondary | ICD-10-CM | POA: Insufficient documentation

## 2015-07-30 DIAGNOSIS — C50912 Malignant neoplasm of unspecified site of left female breast: Secondary | ICD-10-CM

## 2015-07-30 DIAGNOSIS — Z9889 Other specified postprocedural states: Secondary | ICD-10-CM | POA: Diagnosis not present

## 2015-07-30 DIAGNOSIS — R921 Mammographic calcification found on diagnostic imaging of breast: Secondary | ICD-10-CM | POA: Diagnosis not present

## 2015-08-06 ENCOUNTER — Ambulatory Visit (INDEPENDENT_AMBULATORY_CARE_PROVIDER_SITE_OTHER): Payer: Medicare Other | Admitting: General Surgery

## 2015-08-06 ENCOUNTER — Encounter: Payer: Self-pay | Admitting: General Surgery

## 2015-08-06 VITALS — BP 122/70 | HR 58 | Resp 12 | Ht 63.0 in | Wt 178.0 lb

## 2015-08-06 DIAGNOSIS — C50912 Malignant neoplasm of unspecified site of left female breast: Secondary | ICD-10-CM

## 2015-08-06 NOTE — Patient Instructions (Addendum)
The patient is aware to call back for any questions or concerns. The patient has been asked to return to the office in 6 months with a bilateral diagnostic mammogram.

## 2015-08-06 NOTE — Progress Notes (Signed)
Patient ID: [name], female   DOB: 1951/03/07, 65 y.o.   MRN: [id number]  Chief Complaint  Patient presents with  . Follow-up    mammogram    HPI [name] is a 65 y.o. female.  who presents for her follow up breast cancer and a breast evaluation. The most recent left mammogram was done on 07-30-15.  Patient does perform regular self breast checks and gets regular mammograms done.  No new breast issues. She is tolerating the Aromasin. She notices stiffness and dizziness after she takes it   HPI  Past Medical History  Diagnosis Date  . Allergy   . Hepatitis A 1988  . History of chemotherapy     Adriamycin/Cytoxan 12/20/2012-02/27/2013; 12 weekly cycles of Taxol from 03/21/2013-06/06/2013  . History of radiation therapy     09/13/2013-11/08/2013  . Osteopenia   . Hypothyroid   . COPD (chronic obstructive pulmonary disease) ([place]) 2014    seen on chest xray  . History of mammogram 01/2014  . Breast cancer, left ([place]) 12/08/2012    left breast, stage 2a, Her2/neu +  . Uterine cancer ([place])   . Breast cancer [facility])     left    Past Surgical History  Procedure Laterality Date  . Abdominal hysterectomy  1979  . Spinal fusion      x 3  . Colonoscopy  2014  . Breast surgery Left 08-16-13    Needle/wire localization of Left breast   . Portacath placement  2014  . Breast biopsy Left 2014  . Carpal tunnel release    . Colonoscopy  2014  . Breast lumpectomy Left 2014    with Radiation    Family History  Problem Relation Age of Onset  . Breast cancer Mother 5    Social History Social History  Substance Use Topics  . Smoking status: Never Smoker   . Smokeless tobacco: Never Used  . Alcohol Use: No    Allergies  Allergen Reactions  . Prednisone Rash  . Codeine Nausea And Vomiting    sick  . Letrozole Hives  . Other Other (See Comments)    congestion  . Prednisone Rash  . Sulfa Antibiotics Rash and Itching  . Tramadol Nausea And Vomiting, Itching and Rash     Current Outpatient Prescriptions  Medication Sig Dispense Refill  . aspirin 81 MG tablet Take 81 mg by mouth daily.    . cholecalciferol (VITAMIN D) 1000 UNITS tablet Take 1,000 Units by mouth 2 (two) times daily.    . COMBIVENT RESPIMAT 20-100 MCG/ACT AERS respimat Inhale 1 Inhaler into the lungs daily as needed.    . docusate sodium (COLACE) 100 MG capsule Take 200 mg by mouth daily.    . ergocalciferol (VITAMIN D2) 50000 units capsule Take 1 capsule (50,000 Units total) by mouth once a week. 24 capsule 1  . exemestane (AROMASIN) 25 MG tablet Take 1 tablet (25 mg total) by mouth daily after breakfast. 90 tablet 1  . levothyroxine (SYNTHROID, LEVOTHROID) 75 MCG tablet Take by mouth.    . Multiple Vitamins-Minerals (CENTRUM PO) Take by mouth.     No current facility-administered medications for this visit.   Facility-Administered Medications Ordered in Other Visits  Medication Dose Route Frequency Provider Last Rate Last Dose  . fulvestrant (FASLODEX) injection 500 mg  500 mg Intramuscular Q30 days Lequita Asal, MD   500 mg at 11/16/14 1558    Review of Systems Review of Systems  Constitutional: Negative.  Respiratory: Negative.   Cardiovascular: Negative.     Blood pressure 122/70, pulse 58, resp. rate 12, height '5\' 3"'  (1.6 m), weight 178 lb (80.74 kg).  Physical Exam Physical Exam  Constitutional: She is oriented to person, place, and time. She appears well-developed and well-nourished.  HENT:  Mouth/Throat: Oropharynx is clear and moist.  Eyes: Conjunctivae are normal. No scleral icterus.  Neck: Neck supple.  Cardiovascular: Normal rate, regular rhythm and normal heart sounds.   Occasional premature beat  Pulmonary/Chest: Effort normal and breath sounds normal. Right breast exhibits no inverted nipple, no mass, no nipple discharge, no skin change and no tenderness. Left breast exhibits no inverted nipple, no mass, no nipple discharge, no skin change and no  tenderness.    Well healed incision and slight thickening at 3 o'clock left breast.   Lymphadenopathy:    She has no cervical adenopathy.    She has no axillary adenopathy.  Neurological: She is alert and oriented to person, place, and time.  Skin: Skin is warm and dry.  Psychiatric: Her behavior is normal.    Data Reviewed Left breast mammogram dated 07/30/2015 was reviewed and compared to previous studies. Stable post operative changes. BI-RADS-3.  Assessment    Stable breast exam.    Plan        The patient has been asked to return to the office in 6 months with a bilateral diagnostic mammogram.   PCP:  Elisabeth Cara 08/06/2015, 8:06 PM

## 2015-09-04 ENCOUNTER — Other Ambulatory Visit: Payer: Self-pay | Admitting: *Deleted

## 2015-09-04 MED ORDER — EXEMESTANE 25 MG PO TABS: 25.0000 mg | ORAL_TABLET | Freq: Every day | ORAL | Status: DC

## 2015-09-18 ENCOUNTER — Encounter: Payer: Self-pay | Admitting: *Deleted

## 2015-09-18 ENCOUNTER — Emergency Department: Payer: Medicare Other

## 2015-09-18 ENCOUNTER — Emergency Department
Admission: EM | Admit: 2015-09-18 | Discharge: 2015-09-18 | Disposition: A | Discharge status: Home / Self Care | Payer: Medicare Other | Attending: Emergency Medicine | Admitting: Emergency Medicine

## 2015-09-18 ENCOUNTER — Other Ambulatory Visit: Payer: Self-pay | Admitting: *Deleted

## 2015-09-18 DIAGNOSIS — K5900 Constipation, unspecified: Secondary | ICD-10-CM | POA: Diagnosis not present

## 2015-09-18 DIAGNOSIS — Z7982 Long term (current) use of aspirin: Secondary | ICD-10-CM | POA: Diagnosis not present

## 2015-09-18 DIAGNOSIS — R590 Localized enlarged lymph nodes: Secondary | ICD-10-CM | POA: Insufficient documentation

## 2015-09-18 DIAGNOSIS — R109 Unspecified abdominal pain: Secondary | ICD-10-CM

## 2015-09-18 DIAGNOSIS — J449 Chronic obstructive pulmonary disease, unspecified: Secondary | ICD-10-CM | POA: Insufficient documentation

## 2015-09-18 DIAGNOSIS — R1030 Lower abdominal pain, unspecified: Secondary | ICD-10-CM | POA: Diagnosis not present

## 2015-09-18 DIAGNOSIS — K529 Noninfective gastroenteritis and colitis, unspecified: Secondary | ICD-10-CM | POA: Insufficient documentation

## 2015-09-18 DIAGNOSIS — C50419 Malignant neoplasm of upper-outer quadrant of unspecified female breast: Secondary | ICD-10-CM | POA: Diagnosis not present

## 2015-09-18 DIAGNOSIS — M8580 Other specified disorders of bone density and structure, unspecified site: Secondary | ICD-10-CM | POA: Diagnosis not present

## 2015-09-18 DIAGNOSIS — E039 Hypothyroidism, unspecified: Secondary | ICD-10-CM | POA: Insufficient documentation

## 2015-09-18 DIAGNOSIS — Z8542 Personal history of malignant neoplasm of other parts of uterus: Secondary | ICD-10-CM | POA: Diagnosis not present

## 2015-09-18 DIAGNOSIS — Z79899 Other long term (current) drug therapy: Secondary | ICD-10-CM | POA: Insufficient documentation

## 2015-09-18 LAB — URINALYSIS COMPLETE WITH MICROSCOPIC (ARMC ONLY)
BILIRUBIN URINE: NEGATIVE
Bacteria, UA: NONE SEEN
Glucose, UA: NEGATIVE mg/dL
Hgb urine dipstick: NEGATIVE
Nitrite: NEGATIVE
PH: 7 (ref 5.0–8.0)
PROTEIN: NEGATIVE mg/dL
SQUAMOUS EPITHELIAL / LPF: NONE SEEN
Specific Gravity, Urine: 1.02 (ref 1.005–1.030)

## 2015-09-18 LAB — COMPREHENSIVE METABOLIC PANEL
ALK PHOS: 82 U/L (ref 38–126)
ALT: 19 U/L (ref 14–54)
ANION GAP: 10 (ref 5–15)
AST: 26 U/L (ref 15–41)
Albumin: 4.6 g/dL (ref 3.5–5.0)
BILIRUBIN TOTAL: 0.8 mg/dL (ref 0.3–1.2)
BUN: 20 mg/dL (ref 6–20)
CALCIUM: 9.6 mg/dL (ref 8.9–10.3)
CO2: 26 mmol/L (ref 22–32)
CREATININE: 0.89 mg/dL (ref 0.44–1.00)
Chloride: 100 mmol/L — ABNORMAL LOW (ref 101–111)
Glucose, Bld: 104 mg/dL — ABNORMAL HIGH (ref 65–99)
Potassium: 3.8 mmol/L (ref 3.5–5.1)
Sodium: 136 mmol/L (ref 135–145)
Total Protein: 8.1 g/dL (ref 6.5–8.1)

## 2015-09-18 LAB — CBC
HCT: 46.6 % (ref 35.0–47.0)
HEMOGLOBIN: 15.4 g/dL (ref 12.0–16.0)
MCH: 29.9 pg (ref 26.0–34.0)
MCHC: 33.1 g/dL (ref 32.0–36.0)
MCV: 90.1 fL (ref 80.0–100.0)
PLATELETS: 294 10*3/uL (ref 150–440)
RBC: 5.17 MIL/uL (ref 3.80–5.20)
RDW: 12.9 % (ref 11.5–14.5)
WBC: 15.6 10*3/uL — ABNORMAL HIGH (ref 3.6–11.0)

## 2015-09-18 LAB — LIPASE, BLOOD: Lipase: 17 U/L (ref 11–51)

## 2015-09-18 MED ORDER — MORPHINE SULFATE (PF) 4 MG/ML IV SOLN
INTRAVENOUS | Status: AC
  Filled 2015-09-18: qty 1

## 2015-09-18 MED ORDER — ONDANSETRON HCL 4 MG/2ML IJ SOLN
4.0000 mg | Freq: Once | INTRAMUSCULAR | Status: AC | PRN
  Administered 2015-09-18: 4 mg via INTRAVENOUS
  Filled 2015-09-18: qty 2

## 2015-09-18 MED ORDER — ONDANSETRON 4 MG PO TBDP: 4.0000 mg | ORAL_TABLET | Freq: Three times a day (TID) | ORAL | Status: DC | PRN

## 2015-09-18 MED ORDER — METRONIDAZOLE 500 MG PO TABS: 500.0000 mg | ORAL_TABLET | Freq: Three times a day (TID) | ORAL | Status: AC

## 2015-09-18 MED ORDER — DIATRIZOATE MEGLUMINE & SODIUM 66-10 % PO SOLN
15.0000 mL | Freq: Once | ORAL | Status: AC
  Administered 2015-09-18: 15 mL via ORAL

## 2015-09-18 MED ORDER — CIPROFLOXACIN HCL 500 MG PO TABS
500.0000 mg | ORAL_TABLET | Freq: Once | ORAL | Status: AC
  Administered 2015-09-18: 500 mg via ORAL
  Filled 2015-09-18: qty 1

## 2015-09-18 MED ORDER — MORPHINE SULFATE (PF) 4 MG/ML IV SOLN
4.0000 mg | Freq: Once | INTRAVENOUS | Status: AC
  Administered 2015-09-18: 4 mg via INTRAVENOUS

## 2015-09-18 MED ORDER — ONDANSETRON HCL 4 MG/2ML IJ SOLN
4.0000 mg | Freq: Once | INTRAMUSCULAR | Status: AC
  Administered 2015-09-18: 4 mg via INTRAVENOUS

## 2015-09-18 MED ORDER — CIPROFLOXACIN HCL 500 MG PO TABS: 500.0000 mg | ORAL_TABLET | Freq: Two times a day (BID) | ORAL | Status: AC

## 2015-09-18 MED ORDER — METRONIDAZOLE 500 MG PO TABS
500.0000 mg | ORAL_TABLET | Freq: Once | ORAL | Status: AC
  Administered 2015-09-18: 500 mg via ORAL
  Filled 2015-09-18: qty 1

## 2015-09-18 MED ORDER — SODIUM CHLORIDE 0.9 % IV BOLUS (SEPSIS)
1000.0000 mL | Freq: Once | INTRAVENOUS | Status: AC
  Administered 2015-09-18: 1000 mL via INTRAVENOUS

## 2015-09-18 MED ORDER — IOPAMIDOL (ISOVUE-300) INJECTION 61%
100.0000 mL | Freq: Once | INTRAVENOUS | Status: AC | PRN
  Administered 2015-09-18: 100 mL via INTRAVENOUS

## 2015-09-18 MED ORDER — MAGNESIUM CITRATE PO SOLN
1.0000 | Freq: Once | ORAL | Status: AC
  Administered 2015-09-18: 1 via ORAL
  Filled 2015-09-18: qty 296

## 2015-09-18 MED ORDER — ONDANSETRON HCL 4 MG/2ML IJ SOLN
4.0000 mg | Freq: Once | INTRAMUSCULAR | Status: AC
  Administered 2015-09-18: 4 mg via INTRAVENOUS
  Filled 2015-09-18: qty 2

## 2015-09-18 MED ORDER — MORPHINE SULFATE (PF) 4 MG/ML IV SOLN
4.0000 mg | Freq: Once | INTRAVENOUS | Status: AC
  Administered 2015-09-18: 4 mg via INTRAVENOUS
  Filled 2015-09-18: qty 1

## 2015-09-18 MED ORDER — MORPHINE SULFATE 15 MG PO TABS
15.0000 mg | ORAL_TABLET | ORAL | Status: DC | PRN
Start: 2015-09-18 — End: 2015-11-13

## 2015-09-18 MED ORDER — ONDANSETRON HCL 4 MG/2ML IJ SOLN
INTRAMUSCULAR | Status: AC
  Filled 2015-09-18: qty 2

## 2015-09-18 NOTE — ED Provider Notes (Signed)
[facility] Emergency Department Provider Note ____________________________________________  Time seen: Approximately 3:15 PM  I have reviewed the triage vital signs and the nursing notes.   HISTORY  Chief Complaint Abdominal Pain and Constipation  HPI [name] is a 65 y.o. female with a history of breast cancer status post  lumpectomy/chemo/radX on exemestane, COPD, hepA who presents with a history of abdominal pain that has been present for about a month and a half. The pain is diffuse but is worse lower abdomen. It is worse when she sits or stands. She used to have regular bowel movements and now she has been getting constipated frequently requiring mag citrate 3 times in the past 2 weeks, most recently last night but this time with no relief. She endorses nausea and has been vomiting frequently for the past 3 weeks. She reports a metallic taste in her mouth.  She endorses fatigue.  Has had some night sweats predating hormonal Tx.  Reports she sometimes has black stools and sometimes has pale stools.    Patient states she was seen at [facility] urgent care today and was advised to go to the ER for a CT scan.  Past Medical History  Diagnosis Date  . Allergy   . Hepatitis A 1988  . History of chemotherapy     Adriamycin/Cytoxan 12/20/2012-02/27/2013; 12 weekly cycles of Taxol from 03/21/2013-06/06/2013  . History of radiation therapy     09/13/2013-11/08/2013  . Osteopenia   . Hypothyroid   . COPD (chronic obstructive pulmonary disease) ([place]) 2014    seen on chest xray  . History of mammogram 01/2014  . Breast cancer, left ([place]) 12/08/2012    left breast, stage 2a, Her2/neu +  . Uterine cancer ([place])   . Breast cancer [facility])     left    Patient Active Problem List   Diagnosis Date Noted  . Acquired hypothyroidism 08/16/2014  . Osteoporosis, unspecified 02/09/2014  . Malignant neoplasm of breast (female), unspecified site 06/27/2013  . Malignant neoplasm of  upper outer quadrant of female breast ([place]) 12/08/2012  . Lymphadenopathy, axillary 12/08/2012    Past Surgical History  Procedure Laterality Date  . Abdominal hysterectomy  1979  . Spinal fusion      x 3  . Colonoscopy  2014  . Breast surgery Left 08-16-13    Needle/wire localization of Left breast   . Portacath placement  2014  . Breast biopsy Left 2014  . Carpal tunnel release    . Colonoscopy  2014  . Breast lumpectomy Left 2014    with Radiation    Current Outpatient Rx  Name  Route  Sig  Dispense  Refill  . aspirin 81 MG tablet   Oral   Take 81 mg by mouth daily.         . cholecalciferol (VITAMIN D) 1000 UNITS tablet   Oral   Take 1,000 Units by mouth 2 (two) times daily.         . ciprofloxacin (CIPRO) 500 MG tablet   Oral   Take 1 tablet (500 mg total) by mouth 2 (two) times daily.   20 tablet   0   . COMBIVENT RESPIMAT 20-100 MCG/ACT AERS respimat   Inhalation   Inhale 1 Inhaler into the lungs daily as needed.           Dispense as written.   . docusate sodium (COLACE) 100 MG capsule   Oral   Take 200 mg by mouth daily.         Marland Kitchen  ergocalciferol (VITAMIN D2) 50000 units capsule   Oral   Take 1 capsule (50,000 Units total) by mouth once a week.   24 capsule   1   . exemestane (AROMASIN) 25 MG tablet   Oral   Take 1 tablet (25 mg total) by mouth daily after breakfast.   90 tablet   1   . levothyroxine (SYNTHROID, LEVOTHROID) 75 MCG tablet   Oral   Take by mouth.         . metroNIDAZOLE (FLAGYL) 500 MG tablet   Oral   Take 1 tablet (500 mg total) by mouth 3 (three) times daily.   30 tablet   0   . morphine (MSIR) 15 MG tablet   Oral   Take 1 tablet (15 mg total) by mouth every 4 (four) hours as needed for severe pain.   20 tablet   0   . Multiple Vitamins-Minerals (CENTRUM PO)   Oral   Take by mouth.         . ondansetron (ZOFRAN ODT) 4 MG disintegrating tablet   Oral   Take 1 tablet (4 mg total) by mouth every 8 (eight)  hours as needed for nausea or vomiting.   20 tablet   0     Allergies Prednisone; Codeine; Dilaudid; Letrozole; Other; Prednisone; Sulfa antibiotics; and Tramadol  Family History  Problem Relation Age of Onset  . Breast cancer Mother 43    Social History Social History  Substance Use Topics  . Smoking status: Never Smoker   . Smokeless tobacco: Never Used  . Alcohol Use: No    Review of Systems  See hpi; No chest pain, shortness of breath, dysuria; some back soreness; some HA in past few days   10-point ROS otherwise negative.  ____________________________________________   PHYSICAL EXAM:  VITAL SIGNS: ED Triage Vitals  Enc Vitals Group     BP 09/18/15 1354 170/102 mmHg     Pulse Rate 09/18/15 1354 83     Resp 09/18/15 1354 18     Temp 09/18/15 1354 98.1 F (36.7 C)     Temp Source 09/18/15 1354 Oral     SpO2 09/18/15 1354 99 %     Weight 09/18/15 1354 162 lb (73.483 kg)     Height 09/18/15 1354 '5\' 3"'$  (1.6 m)     Head Cir --      Peak Flow --      Pain Score 09/18/15 1355 10     Pain Loc --      Pain Edu? --      Excl. in [place]? --    Constitutional: Alert and oriented. Well appearing and in no acute distress. Eyes: Conjunctivae are normal. PERRL. EOMI. Head: Atraumatic. Nose: No congestion/rhinnorhea. Mouth/Throat: Mucous membranes are dry.  Oropharynx non-erythematous. Neck: No stridor.   Cardiovascular: Normal rate, regular rhythm with occasional extra beats. Grossly normal heart sounds.  Good peripheral circulation. Respiratory: Normal respiratory effort.  No retractions. Lungs CTAB. Gastrointestinal: Soft; mod TTP lower abd without rebound . Musculoskeletal: No lower extremity tenderness nor edema.   Neurologic:  Normal speech and language. No gross focal neurologic deficits are appreciated.  Skin:  Skin is warm, dry and intact. No rash noted. Very tan Psychiatric: Mood and affect are normal. Speech and behavior are  normal.  ____________________________________________   LABS (all labs ordered are listed, but only abnormal results are displayed)  Labs Reviewed  COMPREHENSIVE METABOLIC PANEL - Abnormal; Notable for the following:    Chloride  100 (*)    Glucose, Bld 104 (*)    All other components within normal limits  CBC - Abnormal; Notable for the following:    WBC 15.6 (*)    All other components within normal limits  URINALYSIS COMPLETEWITH MICROSCOPIC ([facility] ONLY) - Abnormal; Notable for the following:    Color, Urine YELLOW (*)    APPearance CLEAR (*)    Ketones, ur TRACE (*)    Leukocytes, UA TRACE (*)    All other components within normal limits  LIPASE, BLOOD   ____________________________________________  EKG  ED ECG REPORT I, Alden Feagan,  Avis Mcmahill, the attending physician, personally viewed and interpreted this ECG.   Date: 09/18/2015  EKG Time: 1359  Rate: 75  Rhythm: nsr with PACs & compensatory pauses Axis:nl  Intervals: normal  ST&T Change: none  ____________________________________________  RADIOLOGY  CT A/P-IMPRESSION: Findings consistent with a degree of colitis, most notably in the descending colon. No appreciable diverticular disease. No surrounding mesenteric thickening. Small bowel appears normal. No bowel obstruction. No abscess.  No periappendiceal region inflammation seen. No renal or ureteral calculus. No hydronephrosis. Small left adrenal adenoma present.  Liver prominent without focal lesion.  Occasional calcified splenic granulomas.  Postoperative change at multiple sites in the lumbar spine.   Electronically Signed  By: Lowella Grip III M.D.  On: 09/18/2015 16:51 ____________________________________________ ____________________________________________   INITIAL IMPRESSION / ASSESSMENT AND PLAN / ED COURSE  Pertinent labs & imaging results that were available during my care of the patient were reviewed by me and considered in  my medical decision making (see chart for details).  ----------------------------------------- 5:19 PM on 09/18/2015 ----------------------------------------- Patient, daughter, spouse updated on CT results. Patient very concerned that she still has not had a bowel movement even though she's been eating 3 times a day. I performed a rectal exam. Patient is not obstructed but she does have some hard stool in the vault. We will administer an enema.  ----------------------------------------- 7:31 PM on 09/18/2015 -----------------------------------------  Patient with significant stool output after soapsuds enema and is feeling better. Will have patient follow-up with Dr. Hoy Morn.  ____________________________________________   FINAL CLINICAL IMPRESSION(S) / ED DIAGNOSES  Final diagnoses:  Abdominal pain, unspecified abdominal location  Colitis  Constipation, unspecified constipation type      New prescriptions started this visit Discharge Medication List as of 09/18/2015  7:34 PM    START taking these medications   Details  ciprofloxacin (CIPRO) 500 MG tablet Take 1 tablet (500 mg total) by mouth 2 (two) times daily., Starting 09/18/2015, Until Sat 09/28/15, Print    metroNIDAZOLE (FLAGYL) 500 MG tablet Take 1 tablet (500 mg total) by mouth 3 (three) times daily., Starting 09/18/2015, Until Wed 09/25/15, Print    morphine (MSIR) 15 MG tablet Take 1 tablet (15 mg total) by mouth every 4 (four) hours as needed for severe pain., Starting 09/18/2015, Until Discontinued, Print    ondansetron (ZOFRAN ODT) 4 MG disintegrating tablet Take 1 tablet (4 mg total) by mouth every 8 (eight) hours as needed for nausea or vomiting., Starting 09/18/2015, Until Discontinued, Print         Ponciano Ort, MD 09/18/15 2337

## 2015-09-18 NOTE — Telephone Encounter (Signed)
This was filled on 09/04/15

## 2015-09-18 NOTE — ED Notes (Signed)
States she has not had a BM in 5 days, states she tried to take mag citrate but with no relief, states she had an xray done at [facility] urgent [facility] and then was sent to Ed for evaluation, states vomiting since last night, states she is passing some gas, abd tender

## 2015-09-18 NOTE — ED Notes (Signed)
Patient states she had her last BM on 09/13/15 and has taken mag citrate with no success as of yesterday.

## 2015-09-18 NOTE — Discharge Instructions (Signed)
Return to the ER for new or worsening pain, if you are not able to keep down liquids, fever, persistent vomiting, or for any other concerns. Try to avoid taking narcotic pain medications as this will worsen her constipation. You may use MiraLAX over-the-counter according to bottle instructions to help keep your bowels moving regularly.   Colitis Colitis is inflammation of the colon. Colitis may last a short time (acute) or it may last a long time (chronic). CAUSES This condition may be caused by:  Viruses.  Bacteria.  Reactions to medicine.  Certain autoimmune diseases, such as Crohn disease or ulcerative colitis. SYMPTOMS Symptoms of this condition include:  Diarrhea.  Passing bloody or tarry stool.  Pain.  Fever.  Vomiting.  Tiredness (fatigue).  Weight loss.  Bloating.  Sudden increase in abdominal pain.  Having fewer bowel movements than usual. DIAGNOSIS This condition is diagnosed with a stool test or a blood test. You may also have other tests, including X-rays, a CT scan, or a colonoscopy. TREATMENT Treatment may include:  Resting the bowel. This involves not eating or drinking for a period of time.  Fluids that are given through an IV tube.  Medicine for pain and diarrhea.  Antibiotic medicines.  Cortisone medicines.  Surgery. HOME CARE INSTRUCTIONS Eating and Drinking  Follow instructions from your health care provider about eating or drinking restrictions.  Drink enough fluid to keep your urine clear or pale yellow.  Work with a dietitian to determine which foods cause your condition to flare up.  Avoid foods that cause flare-ups.  Eat a well-balanced diet. Medicines  Take over-the-counter and prescription medicines only as told by your health care provider.  If you were prescribed an antibiotic medicine, take it as told by your health care provider. Do not stop taking the antibiotic even if you start to feel better. General  Instructions  Keep all follow-up visits as told by your health care provider. This is important. SEEK MEDICAL CARE IF:  Your symptoms do not go away.  You develop new symptoms. SEEK IMMEDIATE MEDICAL CARE IF:  You have a fever that does not go away with treatment.  You develop chills.  You have extreme weakness, fainting, or dehydration.  You have repeated vomiting.  You develop severe pain in your abdomen.  You pass bloody or tarry stool.   This information is not intended to replace advice given to you by your health care provider. Make sure you discuss any questions you have with your health care provider.   Document Released: 07/16/2004 Document Revised: 02/27/2015 Document Reviewed: 10/01/2014 Elsevier Interactive Patient Education 2016 [employer].  Constipation, Adult Constipation is when a person:  Poops (has a bowel movement) less than 3 times a week.  Has a hard time pooping.  Has poop that is dry, hard, or bigger than normal. HOME CARE   Eat foods with a lot of fiber in them. This includes fruits, vegetables, beans, and whole grains such as brown rice.  Avoid fatty foods and foods with a lot of sugar. This includes french fries, hamburgers, cookies, candy, and soda.  If you are not getting enough fiber from food, take products with added fiber in them (supplements).  Drink enough fluid to keep your pee (urine) clear or pale yellow.  Exercise on a regular basis, or as told by your doctor.  Go to the restroom when you feel like you need to poop. Do not hold it.  Only take medicine as told by your doctor.  Do not take medicines that help you poop (laxatives) without talking to your doctor first. GET HELP RIGHT AWAY IF:   You have bright red blood in your poop (stool).  Your constipation lasts more than 4 days or gets worse.  You have belly (abdominal) or butt (rectal) pain.  You have thin poop (as thin as a pencil).  You lose weight, and it cannot  be explained. MAKE SURE YOU:   Understand these instructions.  Will watch your condition.  Will get help right away if you are not doing well or get worse.   This information is not intended to replace advice given to you by your health care provider. Make sure you discuss any questions you have with your health care provider.   Document Released: 11/25/2007 Document Revised: 06/29/2014 Document Reviewed: 03/20/2013 Elsevier Interactive Patient Education [employer].

## 2015-10-10 ENCOUNTER — Inpatient Hospital Stay: Payer: Medicare Other | Attending: Internal Medicine | Admitting: Internal Medicine

## 2015-10-10 ENCOUNTER — Encounter: Payer: Self-pay | Admitting: Internal Medicine

## 2015-10-10 ENCOUNTER — Inpatient Hospital Stay: Payer: Medicare Other

## 2015-10-10 VITALS — BP 154/83 | HR 76 | Temp 97.5°F | Resp 18 | Ht 63.0 in | Wt 172.0 lb

## 2015-10-10 DIAGNOSIS — Z9221 Personal history of antineoplastic chemotherapy: Secondary | ICD-10-CM

## 2015-10-10 DIAGNOSIS — Z79811 Long term (current) use of aromatase inhibitors: Secondary | ICD-10-CM

## 2015-10-10 DIAGNOSIS — Z7982 Long term (current) use of aspirin: Secondary | ICD-10-CM | POA: Diagnosis not present

## 2015-10-10 DIAGNOSIS — C50411 Malignant neoplasm of upper-outer quadrant of right female breast: Secondary | ICD-10-CM

## 2015-10-10 DIAGNOSIS — Z923 Personal history of irradiation: Secondary | ICD-10-CM | POA: Diagnosis not present

## 2015-10-10 DIAGNOSIS — J449 Chronic obstructive pulmonary disease, unspecified: Secondary | ICD-10-CM | POA: Insufficient documentation

## 2015-10-10 DIAGNOSIS — K529 Noninfective gastroenteritis and colitis, unspecified: Secondary | ICD-10-CM | POA: Diagnosis not present

## 2015-10-10 DIAGNOSIS — C50912 Malignant neoplasm of unspecified site of left female breast: Secondary | ICD-10-CM | POA: Diagnosis not present

## 2015-10-10 DIAGNOSIS — M1990 Unspecified osteoarthritis, unspecified site: Secondary | ICD-10-CM

## 2015-10-10 DIAGNOSIS — E039 Hypothyroidism, unspecified: Secondary | ICD-10-CM | POA: Insufficient documentation

## 2015-10-10 DIAGNOSIS — E559 Vitamin D deficiency, unspecified: Secondary | ICD-10-CM

## 2015-10-10 DIAGNOSIS — Z170 Estrogen receptor positive status [ER+]: Secondary | ICD-10-CM

## 2015-10-10 LAB — CBC WITH DIFFERENTIAL/PLATELET
BASOS ABS: 0 10*3/uL (ref 0–0.1)
BASOS PCT: 1 %
EOS ABS: 0.2 10*3/uL (ref 0–0.7)
Eosinophils Relative: 3 %
HCT: 38.2 % (ref 35.0–47.0)
HEMOGLOBIN: 12.9 g/dL (ref 12.0–16.0)
Lymphocytes Relative: 34 %
Lymphs Abs: 2.1 10*3/uL (ref 1.0–3.6)
MCH: 30.5 pg (ref 26.0–34.0)
MCHC: 33.8 g/dL (ref 32.0–36.0)
MCV: 90.1 fL (ref 80.0–100.0)
Monocytes Absolute: 0.6 10*3/uL (ref 0.2–0.9)
Monocytes Relative: 9 %
NEUTROS PCT: 53 %
Neutro Abs: 3.3 10*3/uL (ref 1.4–6.5)
Platelets: 269 10*3/uL (ref 150–440)
RBC: 4.24 MIL/uL (ref 3.80–5.20)
RDW: 13.5 % (ref 11.5–14.5)
WBC: 6.2 10*3/uL (ref 3.6–11.0)

## 2015-10-10 LAB — COMPREHENSIVE METABOLIC PANEL
ALBUMIN: 4.3 g/dL (ref 3.5–5.0)
ALK PHOS: 66 U/L (ref 38–126)
ALT: 24 U/L (ref 14–54)
ANION GAP: 3 — AB (ref 5–15)
AST: 24 U/L (ref 15–41)
BUN: 15 mg/dL (ref 6–20)
CALCIUM: 9.7 mg/dL (ref 8.9–10.3)
CO2: 28 mmol/L (ref 22–32)
CREATININE: 0.94 mg/dL (ref 0.44–1.00)
Chloride: 105 mmol/L (ref 101–111)
GFR calc Af Amer: >60 mL/min (ref >60)
GFR calc non Af Amer: >60 mL/min (ref >60)
GLUCOSE: 106 mg/dL — AB (ref 65–99)
Potassium: 3.8 mmol/L (ref 3.5–5.1)
SODIUM: 136 mmol/L (ref 135–145)
Total Bilirubin: 0.4 mg/dL (ref 0.3–1.2)
Total Protein: 7.3 g/dL (ref 6.5–8.1)

## 2015-10-10 MED ORDER — ERGOCALCIFEROL 1.25 MG (50000 UT) PO CAPS: 50000.0000 [IU] | ORAL_CAPSULE | ORAL | Status: DC

## 2015-10-10 MED ORDER — EXEMESTANE 25 MG PO TABS: 25.0000 mg | ORAL_TABLET | Freq: Every day | ORAL | Status: DC

## 2015-10-10 NOTE — Progress Notes (Signed)
Chaperoned provider with Breast Exam 

## 2015-10-10 NOTE — Progress Notes (Signed)
[place] OFFICE PROGRESS NOTE  Patient Care Team: Hortencia Pilar, MD as PCP - General (Family Medicine) Robert Bellow, MD (General Surgery) Forest Gleason, MD (Unknown Physician Specialty)   SUMMARY OF ONCOLOGIC HISTORY:  # 2014- LEFT BREAST CA [STAGE IIAT1cN1[20m] ER/PR/Her-2 NEU POS s/p NEO-ADJ CHEMO [ddAC-T;finished [id number] Herceptin x1y; finished JAN 2016]; s/p LUMPEC & SLNBx; path CR; s/p RT  # ADJ AI- Intol to Femara/Arimidex; START Faslodex [Jan 2016 thru April 2016]; OCT 2016- START AROMASIN    INTERVAL HISTORY:  [name] 65year old female patient with above history of stage II left-sided breast cancer currently on adjuvant antihormone therapy is here for follow-up.   In the last few weeks patient had "stomach bug"- female emergency room had a CAT scan that showed possible colitis. She is awaiting to see a physician for possible colonoscopy.  Denies any new lumps or bumps. Appetite is fair. No weight loss.  Denies any joint pains or muscle aches . Denies any other bone pain.  REVIEW OF SYSTEMS:  A complete 10 point review of system is done which is negative except mentioned above/history of present illness.   PAST MEDICAL HISTORY :  Past Medical History  Diagnosis Date  . Allergy   . Hepatitis A 1988  . History of chemotherapy     Adriamycin/Cytoxan 12/20/2012-02/27/2013; 12 weekly cycles of Taxol from 03/21/2013-06/06/2013  . History of radiation therapy     09/13/2013-11/08/2013  . Osteopenia   . Hypothyroid   . [place]    seen on chest xray  . History of mammogram 01/2014  . [place]    left breast, stage 2a, Her2/neu +  . [place]   . [place]     [place]    PAST SURGICAL HISTORY :   Past Surgical History  Procedure Laterality Date  . Abdominal hysterectomy  1979  . Spinal fusion      x 3  . Colonoscopy  2014  . Breast surgery Left 08-16-13   Needle/wire localization of Left breast   . Portacath placement  2014  . Breast biopsy Left 2014  . Carpal tunnel release    . Colonoscopy  2014  . Breast lumpectomy Left 2014    with Radiation    FAMILY HISTORY :   Family History  Problem Relation Age of Onset  . Breast cancer Mother 439   SOCIAL HISTORY:   Social History  Substance Use Topics  . Smoking status: Never Smoker   . Smokeless tobacco: Never Used  . Alcohol Use: No    ALLERGIES:  is allergic to prednisone; codeine; dilaudid; letrozole; other; prednisone; sulfa antibiotics; and tramadol.  MEDICATIONS:  Current Outpatient Prescriptions  Medication Sig Dispense Refill  . aspirin 81 MG tablet Take 81 mg by mouth daily.    . Biotin (BIOTIN MAXIMUM STRENGTH) 10 MG TABS Take 10,000 capsules by mouth daily.    . Calcium Carb-Cholecalciferol (CALCIUM + D3) 600-200 MG-UNIT TABS Take 1 tablet by mouth daily.    . cholecalciferol (VITAMIN D) 1000 UNITS tablet Take 1,000 Units by mouth 2 (two) times daily.    . ergocalciferol (VITAMIN D2) 50000 units capsule Take 1 capsule (50,000 Units total) by mouth once a week. 24 capsule 1  . exemestane (AROMASIN) 25 MG tablet Take 1 tablet (25 mg total) by mouth daily after breakfast. 90 tablet 1  . levothyroxine (SYNTHROID, LEVOTHROID) 75 MCG tablet Take by mouth.    .Marland Kitchen  Multiple Vitamins-Minerals (CENTRUM PO) Take by mouth.    . ondansetron (ZOFRAN ODT) 4 MG disintegrating tablet Take 1 tablet (4 mg total) by mouth every 8 (eight) hours as needed for nausea or vomiting. 20 tablet 0  . COMBIVENT RESPIMAT 20-100 MCG/ACT AERS respimat Inhale 1 Inhaler into the lungs daily as needed. Reported on 10/10/2015    . docusate sodium (COLACE) 100 MG capsule Take 200 mg by mouth daily. Reported on 10/10/2015    . morphine (MSIR) 15 MG tablet Take 1 tablet (15 mg total) by mouth every 4 (four) hours as needed for severe pain. (Patient not taking: Reported on 10/10/2015) 20 tablet 0   No current  facility-administered medications for this visit.   Facility-Administered Medications Ordered in Other Visits  Medication Dose Route Frequency Provider Last Rate Last Dose  . fulvestrant (FASLODEX) injection 500 mg  500 mg Intramuscular Q30 days Lequita Asal, MD   500 mg at 11/16/14 1558    PHYSICAL EXAMINATION: ECOG PERFORMANCE STATUS: 0 - Asymptomatic  BP 154/83 mmHg  Pulse 76  Temp(Src) 97.5 F (36.4 C)  Resp 18  Ht _0  (1.6 m)  Wt 171 lb 15.3 oz (78 kg)  BMI 30.47 kg/m2  SpO2 95%  Filed Weights   10/10/15 1509  Weight: 171 lb 15.3 oz (78 kg)    GENERAL: Well-nourished well-developed; Alert, no distress and comfortable. Alone. EYES: no pallor or icterus OROPHARYNX: no thrush or ulceration; good dentition  NECK: supple, no masses felt LYMPH: no palpable lymphadenopathy in the cervical, axillary or inguinal regions LUNGS: clear to auscultation and No wheeze or crackles HEART/CVS: regular rate & rhythm and no murmurs; No lower extremity edema ABDOMEN:abdomen soft, non-tender and normal bowel sounds Musculoskeletal:no cyanosis of digits and no clubbing  PSYCH: alert & oriented x 3 with fluent speech NEURO: no focal motor/sensory deficits SKIN: no rashes or significant lesions Right and left BREAST exam [in the presence of nurse]- no unusual skin changes or dominant masses felt. Surgical scars noted in left breast.      LABORATORY DATA:  I have reviewed the data as listed    Component Value Date/Time   NA 136 10/10/2015 1441   NA 135 10/18/2014 1311   K 3.8 10/10/2015 1441   K 4.4 10/18/2014 1311   CL 105 10/10/2015 1441   CL 103 10/18/2014 1311   CO2 28 10/10/2015 1441   CO2 25 10/18/2014 1311   GLUCOSE 106* 10/10/2015 1441   GLUCOSE 89 10/18/2014 1311   BUN 15 10/10/2015 1441   BUN 24* 10/18/2014 1311   CREATININE 0.94 10/10/2015 1441   CREATININE 0.90 10/18/2014 1311   CALCIUM 9.7 10/10/2015 1441   CALCIUM 9.3 10/18/2014 1311   PROT 7.3  10/10/2015 1441   PROT 7.3 10/18/2014 1311   ALBUMIN 4.3 10/10/2015 1441   ALBUMIN 4.4 10/18/2014 1311   AST 24 10/10/2015 1441   AST 20 10/18/2014 1311   ALT 24 10/10/2015 1441   ALT 16 10/18/2014 1311   ALKPHOS 66 10/10/2015 1441   ALKPHOS 50 10/18/2014 1311   BILITOT 0.4 10/10/2015 1441   BILITOT 0.4 10/18/2014 1311   GFRNONAA >60 10/10/2015 1441   GFRNONAA >60 10/18/2014 1311   GFRNONAA 55* 07/03/2014 1326   GFRAA >60 10/10/2015 1441   GFRAA >60 10/18/2014 1311   GFRAA >60 07/03/2014 1326    No results found for: SPEP, UPEP  Lab Results  Component Value Date   WBC 6.2 10/10/2015   NEUTROABS 3.3  10/10/2015   HGB 12.9 10/10/2015   HCT 38.2 10/10/2015   MCV 90.1 10/10/2015   PLT 269 10/10/2015      Chemistry      Component Value Date/Time   NA 136 10/10/2015 1441   NA 135 10/18/2014 1311   K 3.8 10/10/2015 1441   K 4.4 10/18/2014 1311   CL 105 10/10/2015 1441   CL 103 10/18/2014 1311   CO2 28 10/10/2015 1441   CO2 25 10/18/2014 1311   BUN 15 10/10/2015 1441   BUN 24* 10/18/2014 1311   CREATININE 0.94 10/10/2015 1441   CREATININE 0.90 10/18/2014 1311      Component Value Date/Time   CALCIUM 9.7 10/10/2015 1441   CALCIUM 9.3 10/18/2014 1311   ALKPHOS 66 10/10/2015 1441   ALKPHOS 50 10/18/2014 1311   AST 24 10/10/2015 1441   AST 20 10/18/2014 1311   ALT 24 10/10/2015 1441   ALT 16 10/18/2014 1311   BILITOT 0.4 10/10/2015 1441   BILITOT 0.4 10/18/2014 1311        ASSESSMENT & PLAN:   # BREAST CANCER LEFT STAGE IIA [T1N1]-ER/PR positive HER-2/neu positive status post chemotherapy-complete pathologic response. Patient is currently on adjuvant antihormone therapy with Aromasin. Clinically based on history/review of system; physical exam- no evidence of recurrence noted. Mammogram February left breast- stable. New prescription for exemestane given.  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 Improved on 50,000 units of ergocalciferol once a week. Prescription  given.  # Colitis based on symptoms CT scan- recommend colonoscopy; patient will call her surgeon.  # Follow-up in 6 months/CBC CMP     Cammie Sickle, MD 10/10/2015 3:15 PM

## 2015-10-30 ENCOUNTER — Encounter: Payer: Self-pay | Admitting: General Surgery

## 2015-10-30 ENCOUNTER — Ambulatory Visit (INDEPENDENT_AMBULATORY_CARE_PROVIDER_SITE_OTHER): Payer: Medicare Other | Admitting: General Surgery

## 2015-10-30 VITALS — BP 134/70 | HR 78 | Resp 12 | Ht 64.0 in | Wt 170.0 lb

## 2015-10-30 DIAGNOSIS — K5909 Other constipation: Secondary | ICD-10-CM | POA: Diagnosis not present

## 2015-10-30 LAB — POC HEMOCCULT BLD/STL (OFFICE/1-CARD/DIAGNOSTIC): Fecal Occult Blood, POC: NEGATIVE

## 2015-10-30 MED ORDER — POLYETHYLENE GLYCOL 3350 17 GM/SCOOP PO POWD: 1.0000 | Freq: Once | ORAL | Status: DC

## 2015-10-30 NOTE — Progress Notes (Signed)
Patient ID: [name], female   DOB: 1950-11-07, 65 y.o.   MRN: [id number]  Chief Complaint  Patient presents with  . Colonoscopy    HPI [name] is a 65 y.o. female Here today for a evaluation of a colonoscopy. Patient states she had a ct scan done on 09/12/15 showing colitis. She states she is having a lot of constipation for about five months now. Patient tried frequently requiring mag citrate She utilized 10 ounces of this medication 4 times in the past and it started hurting her stomach even more the last time it was used. When she wasn't getting good results she started using a fleets type enema.    Last colonoscopy was done in 2014. HPI  Past Medical History  Diagnosis Date  . Allergy   . Hepatitis A 1988  . History of chemotherapy     Adriamycin/Cytoxan 12/20/2012-02/27/2013; 12 weekly cycles of Taxol from 03/21/2013-06/06/2013  . History of radiation therapy     09/13/2013-11/08/2013  . Osteopenia   . Hypothyroid   . COPD (chronic obstructive pulmonary disease) ([place]) 2014    seen on chest xray  . History of mammogram 01/2014  . Breast cancer, left ([place]) 12/08/2012    left breast, stage 2a, Her2/neu +  . Uterine cancer ([place])   . Breast cancer [facility])     left    Past Surgical History  Procedure Laterality Date  . Abdominal hysterectomy  1979  . Spinal fusion      x 3  . Breast surgery Left 08-16-13    Needle/wire localization of Left breast   . Portacath placement  2014  . Breast biopsy Left 2014  . Carpal tunnel release    . Colonoscopy  2014  . Breast lumpectomy Left 2014    with Radiation    Family History  Problem Relation Age of Onset  . Breast cancer Mother 53    Social History Social History  Substance Use Topics  . Smoking status: Never Smoker   . Smokeless tobacco: Never Used  . Alcohol Use: No    Allergies  Allergen Reactions  . Prednisone Rash  . Codeine Nausea And Vomiting    sick  . Dilaudid [Hydromorphone Hcl]   . Letrozole Hives  .  Other Other (See Comments)    congestion  . Prednisone Rash  . Sulfa Antibiotics Rash and Itching  . Tramadol Nausea And Vomiting, Itching and Rash    Current Outpatient Prescriptions  Medication Sig Dispense Refill  . aspirin 81 MG tablet Take 81 mg by mouth daily.    . B Complex Vitamins (B-COMPLEX/B-12) TABS Take by mouth.    . Biotin (BIOTIN MAXIMUM STRENGTH) 10 MG TABS Take 10,000 capsules by mouth daily.    . Calcium Carb-Cholecalciferol (CALCIUM + D3) 600-200 MG-UNIT TABS Take 1 tablet by mouth daily.    . cholecalciferol (VITAMIN D) 1000 UNITS tablet Take 1,000 Units by mouth 2 (two) times daily.    . COMBIVENT RESPIMAT 20-100 MCG/ACT AERS respimat Inhale 1 Inhaler into the lungs daily as needed. Reported on 10/10/2015    . ergocalciferol (VITAMIN D2) 50000 units capsule Take 1 capsule (50,000 Units total) by mouth once a week. 24 capsule 1  . exemestane (AROMASIN) 25 MG tablet Take 1 tablet (25 mg total) by mouth daily after breakfast. 90 tablet 1  . levothyroxine (SYNTHROID, LEVOTHROID) 75 MCG tablet Take by mouth.    . morphine (MSIR) 15 MG tablet Take 1 tablet (15 mg  total) by mouth every 4 (four) hours as needed for severe pain. 20 tablet 0  . Multiple Vitamins-Minerals (CENTRUM PO) Take by mouth.    . docusate sodium (COLACE) 100 MG capsule Take 200 mg by mouth daily. Reported on 10/30/2015    . ondansetron (ZOFRAN ODT) 4 MG disintegrating tablet Take 1 tablet (4 mg total) by mouth every 8 (eight) hours as needed for nausea or vomiting. (Patient not taking: Reported on 10/30/2015) 20 tablet 0  . polyethylene glycol powder (GLYCOLAX/MIRALAX) powder Take 255 g by mouth once. Take one half to one capful daily in liquid 255 g 0   No current facility-administered medications for this visit.   Facility-Administered Medications Ordered in Other Visits  Medication Dose Route Frequency Provider Last Rate Last Dose  . fulvestrant (FASLODEX) injection 500 mg  500 mg Intramuscular Q30 days  Lequita Asal, MD   500 mg at 11/16/14 1558    Review of Systems Review of Systems  Constitutional: Negative.   Respiratory: Negative.   Cardiovascular: Negative.     Blood pressure 134/70, pulse 78, resp. rate 12, height _0  (1.626 m), weight 170 lb (77.111 kg).  Physical Exam Physical Exam  Constitutional: She is oriented to person, place, and time. She appears well-developed and well-nourished.  Eyes: Conjunctivae are normal. No scleral icterus.  Neck: Neck supple.  Cardiovascular: Normal rate, regular rhythm and normal heart sounds.   Pulses:      Dorsalis pedis pulses are 2+ on the right side, and 2+ on the left side.       Posterior tibial pulses are 2+ on the right side, and 2+ on the left side.  Occasional premature beat   Pulmonary/Chest: Effort normal and breath sounds normal.  Abdominal: Soft. Normal appearance and bowel sounds are normal. There is tenderness in the right lower quadrant and suprapubic area.  Genitourinary: Rectal exam shows no fissure, no mass, no tenderness and anal tone normal. Guaiac negative stool.  External skin tag left side.  Lymphadenopathy:    She has no cervical adenopathy.  Neurological: She is alert and oriented to person, place, and time.  Skin: Skin is warm and dry.    Data Reviewed 09/18/2015 CT of the abdomen and pelvis obtained through the emergency department:  IMPRESSION: Findings consistent with a degree of colitis, most notably in the descending colon. No appreciable diverticular disease. No surrounding mesenteric thickening. Small bowel appears normal. No bowel obstruction. No abscess.  No periappendiceal region inflammation seen. No renal or ureteral calculus. No hydronephrosis. Small left adrenal adenoma present.  Liver prominent without focal lesion.  Occasional calcified splenic granulomas.  Postoperative change at multiple sites in the lumbar spine.  These films were reviewed. Colonic changes are  mild.  11/14/2012 colonoscopy completed by Verdie Shire, M.D. was normal. 10 year follow-up recommended.    Assessment    Constipation, doubtfully related to CT findings of mild colitis involving the descending colon.    Plan    Prior to scheduling a repeat colonoscopy for the radiologic findings, the patient make use of MiraLAX 1 capful daily. She may use a when necessary Fleets enema.    Patient to have a CBC and Sedimentation rate.  Patient to return in two weeks.  PCP:  Hortencia Pilar This information has been scribed by Gaspar Cola CMA.   Robert Bellow 10/31/2015, 5:02 PM

## 2015-10-30 NOTE — Patient Instructions (Signed)
Use Miralax daily. Follow up in 2 weeks.

## 2015-10-31 DIAGNOSIS — K5900 Constipation, unspecified: Secondary | ICD-10-CM | POA: Insufficient documentation

## 2015-11-11 ENCOUNTER — Encounter: Payer: Self-pay | Admitting: Radiation Oncology

## 2015-11-11 ENCOUNTER — Ambulatory Visit
Admission: RE | Admit: 2015-11-11 | Discharge: 2015-11-11 | Disposition: A | Discharge status: Home / Self Care | Payer: Medicare Other | Source: Ambulatory Visit | Attending: Radiation Oncology | Admitting: Radiation Oncology

## 2015-11-11 VITALS — BP 126/81 | HR 89 | Temp 97.1°F | Resp 20 | Wt 170.9 lb

## 2015-11-11 DIAGNOSIS — C50412 Malignant neoplasm of upper-outer quadrant of left female breast: Secondary | ICD-10-CM

## 2015-11-11 NOTE — Progress Notes (Signed)
Radiation Oncology Follow up Note  Name: [name]   Date:   11/11/2015 MRN:  [id number] DOB: 1950-12-06    This 65 y.o. female presents to the clinic today for follow-up for stage IIa breast cancer now 2 years out whole breast radiation to her left breast.  REFERRING PROVIDER: Hortencia Pilar, MD  HPI: Patient is a 65 year old female now 2 years out having completed whole breast radiation to her left breast for stage IIa (T1 cN1 M0) invasive mammary carcinoma status post new adjuvant chemotherapy with complete response ER/PR PR positive HER-2/neu  overexpressed.. She received Herceptin therapy. Her follow-up mammograms have been fine she's collar currently on Aromasin tolerating that well without side effect. She specifically denies breast tenderness cough or bone pain. She has been having some issues with her bowels recent CT scan showed evidence of colitis she is being treated with mirror lacks.  COMPLICATIONS OF TREATMENT: none  FOLLOW UP COMPLIANCE: keeps appointments   PHYSICAL EXAM:  BP 126/81 mmHg  Pulse 89  Temp(Src) 97.1 F (36.2 C)  Resp 20  Wt 170 lb 13.7 oz (77.5 kg) Lungs are clear to A&P cardiac examination essentially unremarkable with regular rate and rhythm. No dominant mass or nodularity is noted in either breast in 2 positions examined. Incision is well-healed. No axillary or supraclavicular adenopathy is appreciated. Cosmetic result is excellent. Well-developed well-nourished patient in NAD. HEENT reveals PERLA, EOMI, discs not visualized.  Oral cavity is clear. No oral mucosal lesions are identified. Neck is clear without evidence of cervical or supraclavicular adenopathy. Lungs are clear to A&P. Cardiac examination is essentially unremarkable with regular rate and rhythm without murmur rub or thrill. Abdomen is benign with no organomegaly or masses noted. Motor sensory and DTR levels are equal and symmetric in the upper and lower extremities. Cranial nerves II  through XII are grossly intact. Proprioception is intact. No peripheral adenopathy or edema is identified. No motor or sensory levels are noted. Crude visual fields are within normal range.  RADIOLOGY RESULTS: Recent mammograms are reviewed compatible above-stated findings  PLAN: Present time she is doing well now 2 years out with no evidence of disease. I am please were overall progress. I've asked to see her back in 1 year for follow-up. She continues close follow-up care with surgeon as well as medical oncology. She continues on Aromasin. Patient is to call with any concerns.  I would like to take this opportunity for allowing me to participate in the care of your patient.Armstead Peaks., MD

## 2015-11-12 ENCOUNTER — Telehealth: Payer: Self-pay

## 2015-11-12 NOTE — Telephone Encounter (Signed)
Notified patient as instructed, patient pleased. Discussed follow-up appointments, patient agrees  

## 2015-11-12 NOTE — Telephone Encounter (Signed)
-----   Message from Robert Bellow, MD sent at 11/12/2015 12:51 PM EDT ----- Please notify the patient the laboratory studies obtained at her PCP office were normal. Follow-up is scheduled to discuss her progress with bowel movements.Thank you ----- Message -----    From: Chrystie Nose, CMA    Sent: 10/30/2015   4:02 PM      To: Robert Bellow, MD

## 2015-11-13 ENCOUNTER — Ambulatory Visit (INDEPENDENT_AMBULATORY_CARE_PROVIDER_SITE_OTHER): Payer: Medicare Other | Admitting: General Surgery

## 2015-11-13 ENCOUNTER — Encounter: Payer: Self-pay | Admitting: General Surgery

## 2015-11-13 VITALS — BP 124/66 | HR 82 | Resp 16 | Ht 64.0 in | Wt 170.0 lb

## 2015-11-13 DIAGNOSIS — K5909 Other constipation: Secondary | ICD-10-CM

## 2015-11-13 NOTE — Progress Notes (Signed)
Patient ID: [name], female   DOB: Dec 10, 1950, 65 y.o.   MRN: [id number]  Chief Complaint  Patient presents with  . Follow-up    abdominal pain    HPI ROSALI [name] is a 65 y.o. female.  Here today for follow up abdominal pain. She states she has been using miralax daily and her bowels are moving daily and she feels much better. CT abdomen was 09-18-15.  I personally reviewed the patient's history.   HPI  Past Medical History  Diagnosis Date  . Allergy   . Hepatitis A 1988  . History of chemotherapy     Adriamycin/Cytoxan 12/20/2012-02/27/2013; 12 weekly cycles of Taxol from 03/21/2013-06/06/2013  . History of radiation therapy     09/13/2013-11/08/2013  . Osteopenia   . Hypothyroid   . COPD (chronic obstructive pulmonary disease) ([place]) 2014    seen on chest xray  . History of mammogram 01/2014  . Breast cancer, left ([place]) 12/08/2012    left breast, stage 2a, Her2/neu +  . Uterine cancer ([place])   . Breast cancer [facility])     left    Past Surgical History  Procedure Laterality Date  . Abdominal hysterectomy  1979  . Spinal fusion      x 3  . Breast surgery Left 08-16-13    Needle/wire localization of Left breast   . Portacath placement  2014  . Breast biopsy Left 2014  . Carpal tunnel release    . Colonoscopy  2014  . Breast lumpectomy Left 2014    with Radiation    Family History  Problem Relation Age of Onset  . Breast cancer Mother 67    Social History Social History  Substance Use Topics  . Smoking status: Never Smoker   . Smokeless tobacco: Never Used  . Alcohol Use: No    Allergies  Allergen Reactions  . Prednisone Rash  . Codeine Nausea And Vomiting    sick  . Dilaudid [Hydromorphone Hcl]   . Letrozole Hives  . Other Other (See Comments)    congestion  . Prednisone Rash  . Sulfa Antibiotics Rash and Itching  . Tramadol Nausea And Vomiting, Itching and Rash    Current Outpatient Prescriptions  Medication Sig Dispense Refill  . aspirin 81 MG  tablet Take 81 mg by mouth daily.    . B Complex Vitamins (B-COMPLEX/B-12) TABS Take by mouth.    . Biotin (BIOTIN MAXIMUM STRENGTH) 10 MG TABS Take 10,000 capsules by mouth daily.    . Calcium Carb-Cholecalciferol (CALCIUM + D3) 600-200 MG-UNIT TABS Take 1 tablet by mouth daily.    . cholecalciferol (VITAMIN D) 1000 UNITS tablet Take 1,000 Units by mouth 2 (two) times daily.    . COMBIVENT RESPIMAT 20-100 MCG/ACT AERS respimat Inhale 1 Inhaler into the lungs daily as needed. Reported on 10/10/2015    . docusate sodium (COLACE) 100 MG capsule Take 200 mg by mouth daily. Reported on 10/30/2015    . ergocalciferol (VITAMIN D2) 50000 units capsule Take 1 capsule (50,000 Units total) by mouth once a week. 24 capsule 1  . exemestane (AROMASIN) 25 MG tablet Take 1 tablet (25 mg total) by mouth daily after breakfast. 90 tablet 1  . Multiple Vitamins-Minerals (CENTRUM PO) Take by mouth.    . ondansetron (ZOFRAN ODT) 4 MG disintegrating tablet Take 1 tablet (4 mg total) by mouth every 8 (eight) hours as needed for nausea or vomiting. 20 tablet 0  . phentermine (ADIPEX-P) 37.5 MG tablet Take  by mouth.    . polyethylene glycol powder (GLYCOLAX/MIRALAX) powder Take 255 g by mouth once. Take one half to one capful daily in liquid 255 g 0  . levothyroxine (SYNTHROID, LEVOTHROID) 75 MCG tablet Take by mouth.     No current facility-administered medications for this visit.   Facility-Administered Medications Ordered in Other Visits  Medication Dose Route Frequency Provider Last Rate Last Dose  . fulvestrant (FASLODEX) injection 500 mg  500 mg Intramuscular Q30 days Lequita Asal, MD   500 mg at 11/16/14 1558    Review of Systems Review of Systems  Constitutional: Negative.   Respiratory: Negative.   Cardiovascular: Negative.   Gastrointestinal: Negative.     Blood pressure 124/66, pulse 82, resp. rate 16, height '5\' 4"'  (1.626 m), weight 170 lb (77.111 kg).  Physical Exam Physical Exam   Constitutional: She is oriented to person, place, and time. She appears well-developed and well-nourished.  Cardiovascular: Normal rate, regular rhythm and normal heart sounds.   Pulses:      Femoral pulses are 2+ on the right side, and 2+ on the left side. Pulmonary/Chest: Effort normal and breath sounds normal.  Abdominal: Soft. Bowel sounds are normal. There is no hepatosplenomegaly. There is no tenderness.  Lymphadenopathy:       Right: No inguinal adenopathy present.       Left: No inguinal adenopathy present.  Neurological: She is alert and oriented to person, place, and time.  Skin: Skin is warm and dry.  Psychiatric: Her behavior is normal.    Data Reviewed 11/07/2015:Normal CBC and WBC differential. Sedimentation rate normal.  Assessment    Resolution of constipation with daily MiraLAX.    Plan    With resolution of her abdominal symptoms and normal laboratory studies, a follow-up colonoscopy is not indicated at this time. She has been encouraged to continue daily MiraLAX.       PCP:  Odella Aquas 11/13/2015, 10:37 AM

## 2015-11-13 NOTE — Patient Instructions (Addendum)
The patient is aware to call back for any questions or concerns. No colonoscopy at this time. Follow up  Continue miralax

## 2016-01-29 ENCOUNTER — Ambulatory Visit
Admission: RE | Admit: 2016-01-29 | Discharge: 2016-01-29 | Disposition: A | Discharge status: Home / Self Care | Payer: Medicare Other | Source: Ambulatory Visit | Attending: Internal Medicine | Admitting: Internal Medicine

## 2016-01-29 ENCOUNTER — Other Ambulatory Visit: Payer: Self-pay | Admitting: Internal Medicine

## 2016-01-29 DIAGNOSIS — C50411 Malignant neoplasm of upper-outer quadrant of right female breast: Secondary | ICD-10-CM

## 2016-01-29 DIAGNOSIS — R921 Mammographic calcification found on diagnostic imaging of breast: Secondary | ICD-10-CM | POA: Diagnosis not present

## 2016-02-06 ENCOUNTER — Ambulatory Visit: Payer: Medicare Other | Admitting: General Surgery

## 2016-02-12 ENCOUNTER — Encounter: Payer: Self-pay | Admitting: General Surgery

## 2016-02-12 ENCOUNTER — Ambulatory Visit (INDEPENDENT_AMBULATORY_CARE_PROVIDER_SITE_OTHER): Payer: Medicare Other | Admitting: General Surgery

## 2016-02-12 VITALS — BP 142/88 | HR 74 | Resp 14 | Ht 63.0 in | Wt 175.0 lb

## 2016-02-12 DIAGNOSIS — R920 Mammographic microcalcification found on diagnostic imaging of breast: Secondary | ICD-10-CM | POA: Insufficient documentation

## 2016-02-12 DIAGNOSIS — C50912 Malignant neoplasm of unspecified site of left female breast: Secondary | ICD-10-CM | POA: Diagnosis not present

## 2016-02-12 NOTE — Progress Notes (Signed)
Patient ID: [name], female   DOB: 1950/07/03, 65 y.o.   MRN: [id number]  Chief Complaint  Patient presents with  . Follow-up    mammogram    HPI [name] is a 65 y.o. female who presents for a breast evaluation. The most recent mammogram was done on 01/29/2016.  Patient does perform regular self breast checks and gets regular mammograms done.   She denies any new breast issues.  HPI  Past Medical History:  Diagnosis Date  . Allergy   . Breast cancer ([place])    left- chemo/ radiation  . Breast cancer, left ([place]) 12/08/2012   left breast, stage 2a, Her2/neu +  . COPD (chronic obstructive pulmonary disease) ([place]) 2014   seen on chest xray  . Hepatitis A 1988  . History of chemotherapy    Adriamycin/Cytoxan 12/20/2012-02/27/2013; 12 weekly cycles of Taxol from 03/21/2013-06/06/2013  . History of mammogram 01/2014  . History of radiation therapy    09/13/2013-11/08/2013  . Hypothyroid   . Osteopenia   . Status post radiation therapy 10/2013   Left breast radiation  . Uterine cancer [facility])     Past Surgical History:  Procedure Laterality Date  . ABDOMINAL HYSTERECTOMY  1979  . BREAST BIOPSY Left 2014  . BREAST LUMPECTOMY Left 2014   with Radiation  . BREAST SURGERY Left 08-16-13   Needle/wire localization of Left breast   . CARPAL TUNNEL RELEASE    . COLONOSCOPY  2014  . PORTACATH PLACEMENT  2014  . SPINAL FUSION     x 3    Family History  Problem Relation Age of Onset  . Breast cancer Mother 57    Social History Social History  Substance Use Topics  . Smoking status: Never Smoker  . Smokeless tobacco: Never Used  . Alcohol use No    Allergies  Allergen Reactions  . Prednisone Rash  . Codeine Nausea And Vomiting    sick  . Dilaudid [Hydromorphone Hcl]   . Letrozole Hives  . Other Other (See Comments)    congestion  . Prednisone Rash  . Sulfa Antibiotics Rash and Itching  . Tramadol Nausea And Vomiting, Itching and Rash    Current Outpatient  Prescriptions  Medication Sig Dispense Refill  . aspirin 81 MG tablet Take 81 mg by mouth daily.    . Biotin (BIOTIN MAXIMUM STRENGTH) 10 MG TABS Take 10,000 capsules by mouth daily.    . Calcium Carb-Cholecalciferol (CALCIUM + D3) 600-200 MG-UNIT TABS Take 1 tablet by mouth daily.    . COMBIVENT RESPIMAT 20-100 MCG/ACT AERS respimat Inhale 1 Inhaler into the lungs daily as needed. Reported on 10/10/2015    . docusate sodium (COLACE) 100 MG capsule Take 200 mg by mouth daily. Reported on 10/30/2015    . ergocalciferol (VITAMIN D2) 50000 units capsule Take 1 capsule (50,000 Units total) by mouth once a week. 24 capsule 1  . exemestane (AROMASIN) 25 MG tablet Take 1 tablet (25 mg total) by mouth daily after breakfast. 90 tablet 1  . Multiple Vitamins-Minerals (CENTRUM PO) Take by mouth.    . ondansetron (ZOFRAN ODT) 4 MG disintegrating tablet Take 1 tablet (4 mg total) by mouth every 8 (eight) hours as needed for nausea or vomiting. 20 tablet 0  . polyethylene glycol powder (GLYCOLAX/MIRALAX) powder Take 255 g by mouth once. Take one half to one capful daily in liquid 255 g 0  . levothyroxine (SYNTHROID, LEVOTHROID) 75 MCG tablet Take by mouth.  No current facility-administered medications for this visit.    Facility-Administered Medications Ordered in Other Visits  Medication Dose Route Frequency Provider Last Rate Last Dose  . fulvestrant (FASLODEX) injection 500 mg  500 mg Intramuscular Q30 days Lequita Asal, MD   500 mg at 11/16/14 1558    Review of Systems Review of Systems  Constitutional: Negative.   Respiratory: Negative.   Cardiovascular: Negative.     Blood pressure (!) 142/88, pulse 74, resp. rate 14, height _0  (1.6 m), weight 175 lb (79.4 kg).  Physical Exam Physical Exam  Constitutional: She is oriented to person, place, and time. She appears well-developed and well-nourished.  HENT:  Mouth/Throat: Oropharynx is clear and moist.  Eyes: Conjunctivae are normal.  No scleral icterus.  Neck: Neck supple.  Cardiovascular: Normal rate, regular rhythm and normal heart sounds.   Pulmonary/Chest: Effort normal and breath sounds normal. Right breast exhibits no inverted nipple, no mass, no nipple discharge, no skin change and no tenderness. Left breast exhibits no inverted nipple, no mass, no nipple discharge, no skin change and no tenderness.    Well healed scar at 3 o'clock left breast.  Abdominal: Soft. Normal appearance. There is no tenderness.  Lymphadenopathy:    She has no cervical adenopathy.    She has no axillary adenopathy.  Neurological: She is alert and oriented to person, place, and time.  Skin: Skin is warm and dry.  Psychiatric: Her behavior is normal.    Data Reviewed Bilateral diagnostic mammograms dated 01/29/2016 were reviewed included 2016 study. Calcifications in the site of previous wide excision and radiation. This year BI-RADS III per radiology. No interval change my review.  Assessment    Stable breast exam now 2-1/2-three-year status post management of a stage II a carcinoma the left breast.    Plan    Patient reports that she would feel reassured with a follow-up mammogram in 6 months.    Patient to have a left diagnostic mammogram follow up in 6 months.   This information has been scribed by Karie Fetch RN, BSN,BC.   [name] 02/12/2016, 11:07 AM

## 2016-02-12 NOTE — Patient Instructions (Addendum)
The patient is aware to call back for any questions or concerns. Patient to have a left diagnostic mammogram follow up in 6 months

## 2016-03-16 ENCOUNTER — Telehealth: Payer: Self-pay | Admitting: *Deleted

## 2016-03-16 NOTE — Telephone Encounter (Signed)
Call returned to patient after discussing with Dr Rogue Bussing, advised to stop the exemestane and he will discuss further tx with [name] at [name] next visit on 10/19. She stated she looked up the injection and does not want it either, but wants a pill to stop the hormones.

## 2016-03-16 NOTE — Telephone Encounter (Signed)
Reports that the Exemestane is causing her to itch, the roof of her mouth, as well as externally. She is interested in trying the injection. Her next appt is not until 10/19.She states she is not going to take the Exemestane any longer.  Please advise.

## 2016-04-02 ENCOUNTER — Inpatient Hospital Stay: Payer: Medicare Other

## 2016-04-02 ENCOUNTER — Inpatient Hospital Stay: Payer: Medicare Other | Attending: Internal Medicine | Admitting: Internal Medicine

## 2016-04-02 VITALS — BP 144/93 | HR 69 | Temp 97.6°F | Resp 18 | Ht 63.0 in | Wt 174.0 lb

## 2016-04-02 DIAGNOSIS — Z7982 Long term (current) use of aspirin: Secondary | ICD-10-CM | POA: Insufficient documentation

## 2016-04-02 DIAGNOSIS — M8580 Other specified disorders of bone density and structure, unspecified site: Secondary | ICD-10-CM | POA: Insufficient documentation

## 2016-04-02 DIAGNOSIS — Z9221 Personal history of antineoplastic chemotherapy: Secondary | ICD-10-CM | POA: Insufficient documentation

## 2016-04-02 DIAGNOSIS — Z79899 Other long term (current) drug therapy: Secondary | ICD-10-CM | POA: Diagnosis not present

## 2016-04-02 DIAGNOSIS — Z8542 Personal history of malignant neoplasm of other parts of uterus: Secondary | ICD-10-CM | POA: Diagnosis not present

## 2016-04-02 DIAGNOSIS — C50411 Malignant neoplasm of upper-outer quadrant of right female breast: Secondary | ICD-10-CM

## 2016-04-02 DIAGNOSIS — Z923 Personal history of irradiation: Secondary | ICD-10-CM | POA: Diagnosis not present

## 2016-04-02 DIAGNOSIS — Z79811 Long term (current) use of aromatase inhibitors: Secondary | ICD-10-CM

## 2016-04-02 DIAGNOSIS — E039 Hypothyroidism, unspecified: Secondary | ICD-10-CM | POA: Insufficient documentation

## 2016-04-02 DIAGNOSIS — J449 Chronic obstructive pulmonary disease, unspecified: Secondary | ICD-10-CM | POA: Insufficient documentation

## 2016-04-02 DIAGNOSIS — C50412 Malignant neoplasm of upper-outer quadrant of left female breast: Secondary | ICD-10-CM

## 2016-04-02 DIAGNOSIS — Z170 Estrogen receptor positive status [ER+]: Secondary | ICD-10-CM | POA: Diagnosis not present

## 2016-04-02 LAB — CBC WITH DIFFERENTIAL/PLATELET
BASOS PCT: 1 %
Basophils Absolute: 0.1 10*3/uL (ref 0–0.1)
Eosinophils Absolute: 0.3 10*3/uL (ref 0–0.7)
Eosinophils Relative: 4 %
HEMATOCRIT: 43 % (ref 35.0–47.0)
HEMOGLOBIN: 14.5 g/dL (ref 12.0–16.0)
LYMPHS ABS: 2.4 10*3/uL (ref 1.0–3.6)
LYMPHS PCT: 35 %
MCH: 31.4 pg (ref 26.0–34.0)
MCHC: 33.7 g/dL (ref 32.0–36.0)
MCV: 93.3 fL (ref 80.0–100.0)
MONO ABS: 0.6 10*3/uL (ref 0.2–0.9)
MONOS PCT: 8 %
NEUTROS ABS: 3.7 10*3/uL (ref 1.4–6.5)
NEUTROS PCT: 52 %
Platelets: 236 10*3/uL (ref 150–440)
RBC: 4.61 MIL/uL (ref 3.80–5.20)
RDW: 12.9 % (ref 11.5–14.5)
WBC: 7 10*3/uL (ref 3.6–11.0)

## 2016-04-02 LAB — COMPREHENSIVE METABOLIC PANEL
ALBUMIN: 4.6 g/dL (ref 3.5–5.0)
ALK PHOS: 76 U/L (ref 38–126)
ALT: 37 U/L (ref 14–54)
ANION GAP: 11 (ref 5–15)
AST: 35 U/L (ref 15–41)
BILIRUBIN TOTAL: 0.5 mg/dL (ref 0.3–1.2)
BUN: 22 mg/dL — AB (ref 6–20)
CALCIUM: 9.7 mg/dL (ref 8.9–10.3)
CO2: 25 mmol/L (ref 22–32)
Chloride: 100 mmol/L — ABNORMAL LOW (ref 101–111)
Creatinine, Ser: 1.11 mg/dL — ABNORMAL HIGH (ref 0.44–1.00)
GFR calc Af Amer: 59 mL/min — ABNORMAL LOW (ref >60)
GFR, EST NON AFRICAN AMERICAN: 51 mL/min — AB (ref >60)
GLUCOSE: 90 mg/dL (ref 65–99)
Potassium: 4.1 mmol/L (ref 3.5–5.1)
Sodium: 136 mmol/L (ref 135–145)
TOTAL PROTEIN: 7.7 g/dL (ref 6.5–8.1)

## 2016-04-02 NOTE — Progress Notes (Signed)
Patient here for f/u for her breast cancer. D/C AI approximately 2.5 weeks ago due to joint pain, rash and swelling.  Here to discuss tx options.

## 2016-04-02 NOTE — Progress Notes (Signed)
[place] OFFICE PROGRESS NOTE  Patient Care Team: Hortencia Pilar, MD as PCP - General (Family Medicine) Robert Bellow, MD (General Surgery) Forest Gleason, MD (Unknown Physician Specialty)   SUMMARY OF ONCOLOGIC HISTORY:  Oncology History   # 2014- LEFT BREAST CA [STAGE IIAT1cN1[73m] ER/PR/Her-2 NEU POS s/p NEO-ADJ CHEMO [ddAC-T;finished [id number] Herceptin x1y; finished JAN 2016]; s/p LUMPEC & SLNBx; path CR; s/p RT  # ADJ AI- Intol to Femara/Arimidex; START Faslodex [Jan 2016 thru April 2016]; OCT 2016- START AROMASIN- stopped sep 2017 [intol]; Tamoxfen- intol     Carcinoma of upper-outer quadrant of left breast in female, estrogen receptor positive ([place]   12/08/2012 Initial Diagnosis    Carcinoma of upper-outer quadrant of left breast in female, estrogen receptor positive ([facility]         INTERVAL HISTORY:  Pleasant 65year old female patient with above history of stage II left-sided breast cancer currently on adjuvant antihormone therapy is here for follow-up. Patient continues to complain of multiple side effects attributed to her antihormone therapy. Most recently she stopped taking tamoxifen.   Complains of difficulty getting up and moving around because of joint pains muscle aches. She also complains of mood swings weight gain; and also "skin rash". Overall she feels very poorly; she does not wish to continue further antihormone therapy  Denies any new lumps or bumps. Appetite is fair. No weight loss.   REVIEW OF SYSTEMS:  A complete 10 point review of system is done which is negative except mentioned above/history of present illness.   PAST MEDICAL HISTORY :  Past Medical History:  Diagnosis Date  . Allergy   . Breast cancer ([facility]    left- chemo/ radiation  . Breast cancer, left ([facility] 12/08/2012   left breast, stage 2a, Her2/neu +  . COPD (chronic obstructive pulmonary disease) ([facility] 2014   seen on chest xray  . Hepatitis A 1988  . History of  chemotherapy    Adriamycin/Cytoxan 12/20/2012-02/27/2013; 12 weekly cycles of Taxol from 03/21/2013-06/06/2013  . History of mammogram 01/2014  . History of radiation therapy    09/13/2013-11/08/2013  . Hypothyroid   . Osteopenia   . Status post radiation therapy 10/2013   Left breast radiation  . Uterine cancer ([facility]     PAST SURGICAL HISTORY :   Past Surgical History:  Procedure Laterality Date  . ABDOMINAL HYSTERECTOMY  1979  . BREAST BIOPSY Left 2014  . BREAST LUMPECTOMY Left 2014   with Radiation  . BREAST SURGERY Left 08-16-13   Needle/wire localization of Left breast   . CARPAL TUNNEL RELEASE    . COLONOSCOPY  09/14/2012   PVerdie Shire MD; Normal exam.   . PORTACATH PLACEMENT  2014  . SPINAL FUSION     x 3    FAMILY HISTORY :   Family History  Problem Relation Age of Onset  . Breast cancer Mother 419   SOCIAL HISTORY:   Social History  Substance Use Topics  . Smoking status: Never Smoker  . Smokeless tobacco: Never Used  . Alcohol use No    ALLERGIES:  is allergic to prednisone; codeine; dilaudid [hydromorphone hcl]; letrozole; other; prednisone; sulfa antibiotics; and tramadol.  MEDICATIONS:  Current Outpatient Prescriptions  Medication Sig Dispense Refill  . amoxicillin-clavulanate (AUGMENTIN) 875-125 MG tablet Take 1 tablet by mouth every 12 (twelve) hours.    .Marland Kitchenaspirin 81 MG tablet Take 81 mg by mouth daily.    . Biotin (BIOTIN MAXIMUM STRENGTH) 10 MG TABS Take  10,000 capsules by mouth daily.    . ergocalciferol (VITAMIN D2) 50000 units capsule Take 1 capsule (50,000 Units total) by mouth once a week. 24 capsule 1  . ipratropium (ATROVENT) 0.06 % nasal spray Place 1 spray into the nose 3 (three) times daily.    Marland Kitchen levothyroxine (SYNTHROID, LEVOTHROID) 75 MCG tablet Take by mouth.    . Multiple Vitamins-Minerals (CENTRUM PO) Take by mouth.    . polyethylene glycol powder (GLYCOLAX/MIRALAX) powder Take 255 g by mouth once. Take one half to one capful daily in liquid  255 g 0  . Calcium Carb-Cholecalciferol (CALCIUM + D3) 600-200 MG-UNIT TABS Take 1 tablet by mouth daily.    Marland Kitchen exemestane (AROMASIN) 25 MG tablet Take 1 tablet (25 mg total) by mouth daily after breakfast. (Patient not taking: Reported on 04/02/2016) 90 tablet 1   No current facility-administered medications for this visit.    Facility-Administered Medications Ordered in Other Visits  Medication Dose Route Frequency Provider Last Rate Last Dose  . fulvestrant (FASLODEX) injection 500 mg  500 mg Intramuscular Q30 days Lequita Asal, MD   500 mg at 11/16/14 1558    PHYSICAL EXAMINATION: ECOG PERFORMANCE STATUS: 0 - Asymptomatic  BP (!) 144/93 (Patient Position: Sitting)   Pulse 69   Temp 97.6 F (36.4 C) (Tympanic)   Resp 18   Ht _0  (1.6 m)   Wt 174 lb (78.9 kg)   BMI 30.82 kg/m   Filed Weights   04/02/16 1621  Weight: 174 lb (78.9 kg)    GENERAL: Well-nourished well-developed; Alert, no distress and comfortable. Alone. EYES: no pallor or icterus OROPHARYNX: no thrush or ulceration; good dentition  NECK: supple, no masses felt LYMPH: no palpable lymphadenopathy in the cervical, axillary or inguinal regions LUNGS: clear to auscultation and No wheeze or crackles HEART/CVS: regular rate & rhythm and no murmurs; No lower extremity edema ABDOMEN:abdomen soft, non-tender and normal bowel sounds Musculoskeletal:no cyanosis of digits and no clubbing  PSYCH: alert & oriented x 3 with fluent speech NEURO: no focal motor/sensory deficits SKIN: no rashes or significant lesions      LABORATORY DATA:  I have reviewed the data as listed    Component Value Date/Time   NA 136 04/02/2016 1508   NA 135 10/18/2014 1311   K 4.1 04/02/2016 1508   K 4.4 10/18/2014 1311   CL 100 (L) 04/02/2016 1508   CL 103 10/18/2014 1311   CO2 25 04/02/2016 1508   CO2 25 10/18/2014 1311   GLUCOSE 90 04/02/2016 1508   GLUCOSE 89 10/18/2014 1311   BUN 22 (H) 04/02/2016 1508   BUN 24  (H) 10/18/2014 1311   CREATININE 1.11 (H) 04/02/2016 1508   CREATININE 0.90 10/18/2014 1311   CALCIUM 9.7 04/02/2016 1508   CALCIUM 9.3 10/18/2014 1311   PROT 7.7 04/02/2016 1508   PROT 7.3 10/18/2014 1311   ALBUMIN 4.6 04/02/2016 1508   ALBUMIN 4.4 10/18/2014 1311   AST 35 04/02/2016 1508   AST 20 10/18/2014 1311   ALT 37 04/02/2016 1508   ALT 16 10/18/2014 1311   ALKPHOS 76 04/02/2016 1508   ALKPHOS 50 10/18/2014 1311   BILITOT 0.5 04/02/2016 1508   BILITOT 0.4 10/18/2014 1311   GFRNONAA 51 (L) 04/02/2016 1508   GFRNONAA >60 10/18/2014 1311   GFRAA 59 (L) 04/02/2016 1508   GFRAA >60 10/18/2014 1311    No results found for: SPEP, UPEP  Lab Results  Component Value Date   WBC 7.0  04/02/2016   NEUTROABS 3.7 04/02/2016   HGB 14.5 04/02/2016   HCT 43.0 04/02/2016   MCV 93.3 04/02/2016   PLT 236 04/02/2016      Chemistry      Component Value Date/Time   NA 136 04/02/2016 1508   NA 135 10/18/2014 1311   K 4.1 04/02/2016 1508   K 4.4 10/18/2014 1311   CL 100 (L) 04/02/2016 1508   CL 103 10/18/2014 1311   CO2 25 04/02/2016 1508   CO2 25 10/18/2014 1311   BUN 22 (H) 04/02/2016 1508   BUN 24 (H) 10/18/2014 1311   CREATININE 1.11 (H) 04/02/2016 1508   CREATININE 0.90 10/18/2014 1311      Component Value Date/Time   CALCIUM 9.7 04/02/2016 1508   CALCIUM 9.3 10/18/2014 1311   ALKPHOS 76 04/02/2016 1508   ALKPHOS 50 10/18/2014 1311   AST 35 04/02/2016 1508   AST 20 10/18/2014 1311   ALT 37 04/02/2016 1508   ALT 16 10/18/2014 1311   BILITOT 0.5 04/02/2016 1508   BILITOT 0.4 10/18/2014 1311        ASSESSMENT & PLAN:   Carcinoma of upper-outer quadrant of left breast in female, estrogen receptor positive ([place]) # BREAST CANCER LEFT STAGE IIA [T1N1]-ER/PR positive HER-2/neu positive status post chemotherapy-complete pathologic response. Intol to multiple AIs/ Tamoxifen.   # Given the significant intolerance to multiple antiestrogen therapy- recommend  discontinuation at this time. Patient understands the risk of recurrence is higher without antiestrogen therapy; however the same time patient had complete pathologic response to neoadjuvant chemotherapy- which should portend a good prognosis   # Follow-up in 12 months/CBC CMP/ mammogram prior.   # 25 minutes face-to-face with the patient discussing the above plan of care; more than 50% of time spent on prognosis/ natural history; counseling and coordination.      Cammie Sickle, MD 04/05/2016 12:22 PM

## 2016-04-02 NOTE — Assessment & Plan Note (Addendum)
#  BREAST CANCER LEFT STAGE IIA [T1N1]-ER/PR positive HER-2/neu positive status post chemotherapy-complete pathologic response. Intol to multiple AIs/ Tamoxifen.   # Given the significant intolerance to multiple antiestrogen therapy- recommend discontinuation at this time. Patient understands the risk of recurrence is higher without antiestrogen therapy; however the same time patient had complete pathologic response to neoadjuvant chemotherapy- which should portend a good prognosis   # Follow-up in 12 months/CBC CMP/ mammogram prior.   # 25 minutes face-to-face with the patient discussing the above plan of care; more than 50% of time spent on prognosis/ natural history; counseling and coordination.

## 2016-04-09 ENCOUNTER — Ambulatory Visit: Payer: Medicare Other | Admitting: Internal Medicine

## 2016-04-09 ENCOUNTER — Other Ambulatory Visit: Payer: Medicare Other

## 2016-05-28 ENCOUNTER — Other Ambulatory Visit: Payer: Self-pay

## 2016-05-28 DIAGNOSIS — C50912 Malignant neoplasm of unspecified site of left female breast: Secondary | ICD-10-CM

## 2016-06-04 ENCOUNTER — Other Ambulatory Visit: Payer: Self-pay | Admitting: Internal Medicine

## 2016-06-04 DIAGNOSIS — C50411 Malignant neoplasm of upper-outer quadrant of right female breast: Secondary | ICD-10-CM

## 2016-06-04 NOTE — Telephone Encounter (Signed)
Ill request for Ergocalciferol, she has been on weekly dosing since January 2017, I see no labs checking her Vitamin D level. Please advise.

## 2016-06-22 HISTORY — PX: BREAST BIOPSY: SHX20

## 2016-08-05 ENCOUNTER — Ambulatory Visit
Admission: RE | Admit: 2016-08-05 | Discharge: 2016-08-05 | Disposition: A | Discharge status: Home / Self Care | Payer: Medicare Other | Source: Ambulatory Visit | Attending: General Surgery | Admitting: General Surgery

## 2016-08-05 DIAGNOSIS — C50912 Malignant neoplasm of unspecified site of left female breast: Secondary | ICD-10-CM

## 2016-08-05 DIAGNOSIS — R921 Mammographic calcification found on diagnostic imaging of breast: Secondary | ICD-10-CM | POA: Diagnosis not present

## 2016-08-05 DIAGNOSIS — Z853 Personal history of malignant neoplasm of breast: Secondary | ICD-10-CM | POA: Diagnosis not present

## 2016-08-11 ENCOUNTER — Ambulatory Visit: Payer: Medicare Other | Admitting: General Surgery

## 2016-08-19 ENCOUNTER — Encounter: Payer: Self-pay | Admitting: General Surgery

## 2016-08-19 ENCOUNTER — Ambulatory Visit (INDEPENDENT_AMBULATORY_CARE_PROVIDER_SITE_OTHER): Payer: Medicare Other | Admitting: General Surgery

## 2016-08-19 VITALS — BP 130/72 | HR 74 | Resp 12 | Ht 63.0 in | Wt 174.0 lb

## 2016-08-19 DIAGNOSIS — R920 Mammographic microcalcification found on diagnostic imaging of breast: Secondary | ICD-10-CM

## 2016-08-19 NOTE — Progress Notes (Signed)
Patient ID: [name], female   DOB: 1951-04-22, 66 y.o.   MRN: [id number]  Chief Complaint  Patient presents with  . Follow-up    mammogram     HPI [name] is a 66 y.o. female who presents for a breast evaluation. The most recent left breast mammogram was done on 08/15/2016.  Patient does perform regular self breast checks and gets regular mammograms done.    HPI  Past Medical History:  Diagnosis Date  . Allergy   . Breast cancer ([place])    left- chemo/ radiation  . Breast cancer, left ([place]) 12/08/2012   left breast, stage 2a, Her2/neu +  . COPD (chronic obstructive pulmonary disease) ([place]) 2014   seen on chest xray  . Hepatitis A 1988  . History of chemotherapy    Adriamycin/Cytoxan 12/20/2012-02/27/2013; 12 weekly cycles of Taxol from 03/21/2013-06/06/2013  . History of mammogram 01/2014  . History of radiation therapy    09/13/2013-11/08/2013  . Hypothyroid   . Osteopenia   . Status post radiation therapy 10/2013   Left breast radiation  . Uterine cancer [facility])     Past Surgical History:  Procedure Laterality Date  . ABDOMINAL HYSTERECTOMY  1979  . BREAST BIOPSY Left 2014  . BREAST LUMPECTOMY Left 2014   with Radiation  . BREAST SURGERY Left 08-16-13   Needle/wire localization of Left breast   . CARPAL TUNNEL RELEASE    . COLONOSCOPY  09/14/2012   Verdie Shire, MD; Normal exam.   . PORTACATH PLACEMENT  2014  . SPINAL FUSION     x 3    Family History  Problem Relation Age of Onset  . Breast cancer Mother 19    Social History Social History  Substance Use Topics  . Smoking status: Never Smoker  . Smokeless tobacco: Never Used  . Alcohol use No    Allergies  Allergen Reactions  . Prednisone Rash  . Codeine Nausea And Vomiting    sick  . Dilaudid [Hydromorphone Hcl]   . Letrozole Hives  . Other Other (See Comments)    congestion  . Prednisone Rash  . Sulfa Antibiotics Rash and Itching  . Tramadol Nausea And Vomiting, Itching and Rash    Current  Outpatient Prescriptions  Medication Sig Dispense Refill  . aspirin 81 MG tablet Take 81 mg by mouth daily.    . Biotin (BIOTIN MAXIMUM STRENGTH) 10 MG TABS Take 10,000 capsules by mouth daily.    . Multiple Vitamins-Minerals (CENTRUM PO) Take by mouth.    Marland Kitchen ipratropium (ATROVENT) 0.06 % nasal spray Place 1 spray into the nose 3 (three) times daily.    Marland Kitchen levothyroxine (SYNTHROID, LEVOTHROID) 75 MCG tablet Take by mouth.     No current facility-administered medications for this visit.    Facility-Administered Medications Ordered in Other Visits  Medication Dose Route Frequency Provider Last Rate Last Dose  . fulvestrant (FASLODEX) injection 500 mg  500 mg Intramuscular Q30 days Lequita Asal, MD   500 mg at 11/16/14 1558    Review of Systems Review of Systems  Constitutional: Negative.   Respiratory: Negative.   Cardiovascular: Negative.     Blood pressure 130/72, pulse 74, resp. rate 12, height '5\' 3"'  (1.6 m), weight 174 lb (78.9 kg).  Physical Exam Physical Exam  Constitutional: She is oriented to person, place, and time. She appears well-developed and well-nourished.  Eyes: Conjunctivae are normal. No scleral icterus.  Neck: Neck supple.  Cardiovascular: Normal rate, regular rhythm and  normal heart sounds.   Pulmonary/Chest: Effort normal and breath sounds normal. Left breast exhibits no inverted nipple, no mass, no nipple discharge, no skin change and no tenderness.    Little thickening at 3 o'clock below the scar left breast.  Lymphadenopathy:    She has no cervical adenopathy.  Neurological: She is alert and oriented to person, place, and time.  Skin: Skin is warm and dry.    Data Reviewed 01/28/2015 mammograms show dystrophic calcifications in the left breast. BI-RADS-3.  07/30/2015 mammogram of the left breast showed benign calcifications in the area of previous surgery, likely fat necrosis. BI-RADS-3.  01/29/2016 bilateral mammogram showed days prior  posttreatment changes in the upper-outer quadrant, benign dystrophic calcifications. BI-RADS-3.  08/05/2016 left breast mammogram showed calcifications consistent with fat necrosis. BI-RADS-3.  Assessment    Stable microcalcifications in the area of previous surgery/radiation. Likely secondary to fat necrosis.    Plan    The patient has undergone 18 months of follow-up with no interval change. My suspicion for recurrent malignancy is incredibly low. I encouraged the patient to resume annual screening diagnostic mammograms in August 2018. In spite of the fact that she's had stable exams over the last 18 months she seems more anxious than ever. I offered core biopsy to confirm the clinical and radiologic impression of fat necrosis. She'll consider this option and notify the office of how she would like to proceed.  Left breast office biopsy was discussed with patient. Patient will think about this and let us know if she would like to proceed.   The patient will be scheduled for bilateral diagnostic mammograms in 6 months.   This information has been scribed by Gaspar Cola CMA.    Robert Bellow 08/20/2016, 2:38 PM

## 2016-08-20 ENCOUNTER — Telehealth: Payer: Self-pay | Admitting: *Deleted

## 2016-08-20 NOTE — Telephone Encounter (Signed)
Patient called office wanted to schedule breast biopsy. I scheduled her 08/31/16 per her request, is this a core biopsy? Thanks

## 2016-08-31 ENCOUNTER — Inpatient Hospital Stay: Payer: Self-pay

## 2016-08-31 ENCOUNTER — Ambulatory Visit (INDEPENDENT_AMBULATORY_CARE_PROVIDER_SITE_OTHER): Payer: Medicare Other | Admitting: General Surgery

## 2016-08-31 ENCOUNTER — Encounter: Payer: Self-pay | Admitting: General Surgery

## 2016-08-31 VITALS — BP 130/72 | Ht 63.0 in | Wt 171.0 lb

## 2016-08-31 DIAGNOSIS — N6321 Unspecified lump in the left breast, upper outer quadrant: Secondary | ICD-10-CM | POA: Diagnosis not present

## 2016-08-31 DIAGNOSIS — N6320 Unspecified lump in the left breast, unspecified quadrant: Secondary | ICD-10-CM

## 2016-08-31 DIAGNOSIS — R920 Mammographic microcalcification found on diagnostic imaging of breast: Secondary | ICD-10-CM

## 2016-08-31 NOTE — Patient Instructions (Signed)

## 2016-08-31 NOTE — Progress Notes (Signed)
Patient ID: [name], female   DOB: Sep 07, 1950, 66 y.o.   MRN: [id number]  Chief Complaint  Patient presents with  . Procedure    left breast biopsy    HPI [name] is a 66 y.o. female here for a scheduled left breast biopsy. The patient has remained very anxious regarding the area of calcifications at the site of her previous tumor treatment. In spite of reassurance by both the radiologist and myself she desired biopsy to confirm that there is no new pathology.   She is here today with her daughter [name].  HPI  Past Medical History:  Diagnosis Date  . Allergy   . Breast cancer ([place])    left- chemo/ radiation  . Breast cancer, left ([place]) 12/08/2012   left breast, stage 2a, Her2/neu +  . COPD (chronic obstructive pulmonary disease) ([place]) 2014   seen on chest xray  . Hepatitis A 1988  . History of chemotherapy    Adriamycin/Cytoxan 12/20/2012-02/27/2013; 12 weekly cycles of Taxol from 03/21/2013-06/06/2013  . History of mammogram 01/2014  . History of radiation therapy    09/13/2013-11/08/2013  . Hypothyroid   . Osteopenia   . Status post radiation therapy 10/2013   Left breast radiation  . Uterine cancer [facility])     Past Surgical History:  Procedure Laterality Date  . ABDOMINAL HYSTERECTOMY  1979  . BREAST BIOPSY Left 2014  . BREAST LUMPECTOMY Left 2014   with Radiation  . BREAST SURGERY Left 08-16-13   Needle/wire localization of Left breast   . CARPAL TUNNEL RELEASE    . COLONOSCOPY  09/14/2012   Verdie Shire, MD; Normal exam.   . PORTACATH PLACEMENT  2014  . SPINAL FUSION     x 3    Family History  Problem Relation Age of Onset  . Breast cancer Mother 20    Social History Social History  Substance Use Topics  . Smoking status: Never Smoker  . Smokeless tobacco: Never Used  . Alcohol use No    Allergies  Allergen Reactions  . Prednisone Rash  . Codeine Nausea And Vomiting    sick  . Dilaudid [Hydromorphone Hcl]   . Letrozole Hives  . Other Other (See  Comments)    congestion  . Prednisone Rash  . Sulfa Antibiotics Rash and Itching  . Tramadol Nausea And Vomiting, Itching and Rash    Current Outpatient Prescriptions  Medication Sig Dispense Refill  . aspirin 81 MG tablet Take 81 mg by mouth daily.    . Biotin (BIOTIN MAXIMUM STRENGTH) 10 MG TABS Take 10,000 capsules by mouth daily.    Marland Kitchen levothyroxine (SYNTHROID, LEVOTHROID) 75 MCG tablet Take 75 mcg by mouth daily before breakfast.    . Multiple Vitamins-Minerals (CENTRUM PO) Take by mouth.     No current facility-administered medications for this visit.    Facility-Administered Medications Ordered in Other Visits  Medication Dose Route Frequency Provider Last Rate Last Dose  . fulvestrant (FASLODEX) injection 500 mg  500 mg Intramuscular Q30 days Lequita Asal, MD   500 mg at 11/16/14 1558    Review of Systems Review of Systems  Constitutional: Negative.   Respiratory: Negative.   Cardiovascular: Negative.     Blood pressure 130/72, height '5\' 3"'  (1.6 m), weight 171 lb (77.6 kg).  Physical Exam Physical Exam  Constitutional: She is oriented to person, place, and time. She appears well-developed and well-nourished.  Pulmonary/Chest:    Neurological: She is alert and oriented to  person, place, and time.  Psychiatric: She has a normal mood and affect.    Data Reviewed Prior mammograms reviewed. Dense calcifications in the area of previous treatment of the left breast.  Assessment    Left breast thickening/microcalcification secondary to fat necrosis.    Plan    Ultrasound examination was completed and there is an area of mass effect associated with dense shadowing consistent with her known calcifications at the 3:00 position of the left breast, 4 cm from the nipple. This measures up to 1.44 cm in diameter. BIRAD 3.  10 mL of 0.5% Xylocaine with 0.25% Marcaine with 1-200,000 of epinephrine was utilized well tolerated. A 14-gauge Bard core biopsy device was  utilized anticipating dense scarring. 8 core samples were obtained both from the area of calcifications as well as the soft tissue/breast parenchyma superior to this. Less than 5 mL of bleeding noted. A postbiopsy clip placed. The wound was closed with benzoin and Steri-Strips followed by Telfa and Tegaderm dressing. Postbiopsy instructions provided.     The patient will be contacted when biopsy results are available.    This has been scribed by Lesly Rubenstein LPN    Robert Bellow 08/31/2016, 4:41 PM

## 2016-09-18 ENCOUNTER — Encounter: Payer: Self-pay | Admitting: *Deleted

## 2016-10-02 ENCOUNTER — Other Ambulatory Visit: Payer: Self-pay | Admitting: Family Medicine

## 2016-10-02 DIAGNOSIS — Z780 Asymptomatic menopausal state: Secondary | ICD-10-CM

## 2016-11-09 ENCOUNTER — Ambulatory Visit: Payer: Medicare Other | Admitting: Radiation Oncology

## 2016-11-12 ENCOUNTER — Ambulatory Visit: Admission: RE | Admit: 2016-11-12 | Payer: Medicare Other | Source: Ambulatory Visit | Admitting: Radiation Oncology

## 2016-11-13 ENCOUNTER — Other Ambulatory Visit: Payer: Self-pay | Admitting: *Deleted

## 2016-11-13 DIAGNOSIS — M25552 Pain in left hip: Secondary | ICD-10-CM

## 2016-11-13 DIAGNOSIS — M25551 Pain in right hip: Secondary | ICD-10-CM

## 2016-11-19 ENCOUNTER — Ambulatory Visit
Admission: RE | Admit: 2016-11-19 | Discharge: 2016-11-19 | Disposition: A | Discharge status: Home / Self Care | Payer: Medicare Other | Source: Ambulatory Visit | Attending: Family Medicine | Admitting: Family Medicine

## 2016-11-19 DIAGNOSIS — M810 Age-related osteoporosis without current pathological fracture: Secondary | ICD-10-CM | POA: Diagnosis not present

## 2016-11-19 DIAGNOSIS — Z780 Asymptomatic menopausal state: Secondary | ICD-10-CM | POA: Diagnosis present

## 2016-12-03 ENCOUNTER — Other Ambulatory Visit: Payer: Self-pay

## 2016-12-03 DIAGNOSIS — C50412 Malignant neoplasm of upper-outer quadrant of left female breast: Secondary | ICD-10-CM

## 2016-12-03 DIAGNOSIS — Z170 Estrogen receptor positive status [ER+]: Principal | ICD-10-CM

## 2016-12-08 ENCOUNTER — Other Ambulatory Visit: Payer: Self-pay | Admitting: *Deleted

## 2016-12-08 NOTE — Telephone Encounter (Signed)
Patient no longer patient of this practice

## 2017-01-29 ENCOUNTER — Ambulatory Visit
Admission: RE | Admit: 2017-01-29 | Discharge: 2017-01-29 | Disposition: A | Discharge status: Home / Self Care | Payer: Medicare Other | Source: Ambulatory Visit | Attending: General Surgery | Admitting: General Surgery

## 2017-01-29 DIAGNOSIS — R921 Mammographic calcification found on diagnostic imaging of breast: Secondary | ICD-10-CM | POA: Insufficient documentation

## 2017-01-29 DIAGNOSIS — Z170 Estrogen receptor positive status [ER+]: Principal | ICD-10-CM

## 2017-01-29 DIAGNOSIS — C50412 Malignant neoplasm of upper-outer quadrant of left female breast: Secondary | ICD-10-CM

## 2017-02-03 ENCOUNTER — Ambulatory Visit (INDEPENDENT_AMBULATORY_CARE_PROVIDER_SITE_OTHER): Payer: Medicare Other | Admitting: General Surgery

## 2017-02-03 ENCOUNTER — Encounter: Payer: Self-pay | Admitting: General Surgery

## 2017-02-03 VITALS — BP 136/74 | HR 72 | Resp 12 | Ht 63.0 in | Wt 175.0 lb

## 2017-02-03 DIAGNOSIS — Z853 Personal history of malignant neoplasm of breast: Secondary | ICD-10-CM | POA: Insufficient documentation

## 2017-02-03 DIAGNOSIS — Z8542 Personal history of malignant neoplasm of other parts of uterus: Secondary | ICD-10-CM | POA: Insufficient documentation

## 2017-02-03 NOTE — Progress Notes (Signed)
Patient ID: [name], female   DOB: 11/06/1950, 66 y.o.   MRN: [id number]  Chief Complaint  Patient presents with  . Follow-up    mammogram    HPI [name] is a 66 y.o. female who presents for a breast evaluation. The most recent mammogram was done on 01/29/2017.  Patient does perform regular self breast checks and gets regular mammograms done. Patient had a rash all over her body last week . Much better now. Granddaughter , Brittany is present.     HPI  Past Medical History:  Diagnosis Date  . Allergy   . Breast cancer ([facility])    left- chemo/ radiation  . Breast cancer, left ([facility]) 12/08/2012   left breast, stage 2a, Her2/neu +  . COPD (chronic obstructive pulmonary disease) ([facility]) 2014   seen on chest xray  . Hepatitis A 1988  . History of chemotherapy    Adriamycin/Cytoxan 12/20/2012-02/27/2013; 12 weekly cycles of Taxol from 03/21/2013-06/06/2013  . History of mammogram 01/2014  . History of radiation therapy    09/13/2013-11/08/2013  . Hypothyroid   . Osteopenia   . Status post radiation therapy 10/2013   Left breast radiation  . Uterine cancer ([facility])     Past Surgical History:  Procedure Laterality Date  . ABDOMINAL HYSTERECTOMY  1979  . BREAST BIOPSY Left 2014  . BREAST LUMPECTOMY Left 2014   with Radiation  . BREAST SURGERY Left 08-16-13   Needle/wire localization of Left breast   . CARPAL TUNNEL RELEASE    . COLONOSCOPY  09/14/2012   Paul Oh, MD; Normal exam.   . PORTACATH PLACEMENT  2014  . SPINAL FUSION     x 3    Family History  Problem Relation Age of Onset  . Breast cancer Mother 47    Social History Social History  Substance Use Topics  . Smoking status: Never Smoker  . Smokeless tobacco: Never Used  . Alcohol use No    Allergies  Allergen Reactions  . Prednisone Rash  . Codeine Nausea And Vomiting    sick  . Dilaudid [Hydromorphone Hcl]   . Letrozole Hives  . Other Other (See Comments)    congestion  . Prednisone Rash  . Sulfa  Antibiotics Rash and Itching  . Tramadol Nausea And Vomiting, Itching and Rash    Current Outpatient Prescriptions  Medication Sig Dispense Refill  . aspirin 81 MG tablet Take 81 mg by mouth daily.    . Biotin (BIOTIN MAXIMUM STRENGTH) 10 MG TABS Take 10,000 capsules by mouth daily.    . levothyroxine (SYNTHROID, LEVOTHROID) 75 MCG tablet Take 75 mcg by mouth daily before breakfast.    . Multiple Vitamins-Minerals (CENTRUM PO) Take by mouth.     No current facility-administered medications for this visit.    Facility-Administered Medications Ordered in Other Visits  Medication Dose Route Frequency Provider Last Rate Last Dose  . fulvestrant (FASLODEX) injection 500 mg  500 mg Intramuscular Q30 days Corcoran, Melissa C, MD   500 mg at 11/16/14 1558    Review of Systems Review of Systems  Constitutional: Negative.   Respiratory: Negative.   Cardiovascular: Negative.     Blood pressure 136/74, pulse 72, resp. rate 12, height 5' 3" (1.6 m), weight 175 lb (79.4 kg).  Physical Exam Physical Exam  Constitutional: She is oriented to person, place, and time. She appears well-developed and well-nourished.  Eyes: Conjunctivae are normal. No scleral icterus.  Neck: Neck supple.  Cardiovascular: Normal rate, regular   rhythm and normal heart sounds.   Pulmonary/Chest: Effort normal and breath sounds normal. Right breast exhibits no inverted nipple, no mass, no nipple discharge, no skin change and no tenderness. Left breast exhibits no inverted nipple, no mass, no nipple discharge, no skin change and no tenderness.    Lymphadenopathy:    She has no cervical adenopathy.    She has no axillary adenopathy.  Neurological: She is alert and oriented to person, place, and time.  Skin: Skin is warm and dry.    Data Reviewed Bilateral mammograms dated 01/29/2017 showed evidence of fat necrosis at the surgical site, BIRAD-2.  Assessment    Benign breast exam. No evidence of recurrent  malignancy.    Plan         The patient has been asked to return to the office in one year with a bilateral diagnostic mammogram. The patient is aware to call back for any questions or concerns.  HPI, Physical Exam, Assessment and Plan have been scribed under the direction and in the presence of Jeffrey Byrnett, MD.  Jessica Qualls, CMA  I have completed the exam and reviewed the above documentation for accuracy and completeness.  I agree with the above.  [employer] has been used and any errors in dictation or transcription are unintentional.  Jeffrey Byrnett, M.D., F.A.C.S. Byrnett, Jeffrey W 02/03/2017, 8:03 PM   

## 2017-02-03 NOTE — Patient Instructions (Signed)
The patient has been asked to return to the office in one year with a bilateral diagnostic mammogram.The patient is aware to call back for any questions or concerns. 

## 2017-04-02 ENCOUNTER — Inpatient Hospital Stay: Payer: Medicare Other | Admitting: Internal Medicine

## 2017-04-02 ENCOUNTER — Inpatient Hospital Stay: Payer: Medicare Other

## 2017-04-02 NOTE — Progress Notes (Deleted)
[place] OFFICE PROGRESS NOTE  [name] Care Team: Kelly Pilar, MD as PCP - General (Family Medicine) [name], Kelly Gleason, MD (General Surgery) Kelly Gleason, MD (Unknown Physician Specialty)   SUMMARY OF ONCOLOGIC HISTORY:  Oncology History   # 2014- LEFT BREAST CA [STAGE IIAT1cN1[61m] ER/PR/Her-2 NEU POS s/p NEO-ADJ CHEMO [ddAC-T;finished [id number] Herceptin x1y; finished JAN 2016]; s/p LUMPEC & SLNBx; path CR; s/p RT  # ADJ AI- Intol to Femara/Arimidex; START Faslodex [Jan 2016 thru April 2016]; OCT 2016- START AROMASIN- stopped sep 2017 [intol]; Tamoxfen- intol     Carcinoma of upper-outer quadrant of left breast in [place], estrogen receptor positive ([place]   12/08/2012 Initial Diagnosis    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 [name] continues to complain of multiple side effects attributed to her antihormone therapy. Most recently she stopped taking tamoxifen.   Complains of difficulty getting up and moving around because of joint pains muscle aches. She also complains of mood swings weight gain; and also "skin rash". Overall she feels very poorly; she does not wish to continue further antihormone therapy  Denies any new lumps or bumps. Appetite is fair. No weight loss.   REVIEW OF SYSTEMS:  A complete 10 point review of system is done which is negative except mentioned above/history of present illness.   PAST MEDICAL HISTORY :  Past Medical History:  Diagnosis Date  . Allergy   . Breast cancer ([facility]    left- chemo/ radiation  . [place]   left breast, stage 2a, Her2/neu +  . COPD (chronic obstructive pulmonary disease) [place] 2014   seen on chest xray  . Hepatitis A 1988  . History of  chemotherapy    Adriamycin/Cytoxan 12/20/2012-02/27/2013; 12 weekly cycles of Taxol from 03/21/2013-06/06/2013  . History of mammogram 01/2014  . History of radiation therapy    09/13/2013-11/08/2013  . Hypothyroid   . Osteopenia   . Status post radiation therapy 10/2013   Left breast radiation  . Uterine cancer ([facility]     PAST SURGICAL HISTORY :   Past Surgical History:  Procedure Laterality Date  . ABDOMINAL HYSTERECTOMY  1979  . BREAST BIOPSY Left 2014  . BREAST LUMPECTOMY Left 2014   with Radiation  . BREAST SURGERY Left 08-16-13   Needle/wire localization of Left breast   . CARPAL TUNNEL RELEASE    . COLONOSCOPY  09/14/2012   PVerdie Shire MD; Normal exam.   . PORTACATH PLACEMENT  2014  . SPINAL FUSION     x 3    FAMILY HISTORY :   Family History  Problem Relation Age of Onset  . Breast cancer Mother 441   SOCIAL HISTORY:   Social History  Substance Use Topics  . Smoking status: Never Smoker  . Smokeless tobacco: Never Used  . Alcohol use No    ALLERGIES:  is allergic to prednisone; codeine; dilaudid [hydromorphone hcl]; letrozole; other; prednisone; sulfa antibiotics; and tramadol.  MEDICATIONS:  Current Outpatient Prescriptions  Medication Sig Dispense Refill  . aspirin 81 MG tablet Take 81 mg by mouth daily.    . Biotin (BIOTIN MAXIMUM STRENGTH) 10 MG TABS Take 10,000 capsules by mouth daily.    .Marland Kitchenlevothyroxine (SYNTHROID, LEVOTHROID) 75 MCG tablet Take 75 mcg  by mouth daily before breakfast.    . Multiple Vitamins-Minerals (CENTRUM PO) Take by mouth.     No current facility-administered medications for this visit.    Facility-Administered Medications Ordered in Other Visits  Medication Dose Route Frequency Provider Last Rate Last Dose  . fulvestrant (FASLODEX) injection 500 mg  500 mg Intramuscular Q30 days Kelly Stalls C, MD   500 mg at 11/16/14 1558    PHYSICAL EXAMINATION: ECOG PERFORMANCE STATUS: 0 - Asymptomatic  There were no vitals taken for this  visit.  There were no vitals filed for this visit.  GENERAL: Well-nourished well-developed; Alert, no distress and comfortable. Alone. EYES: no pallor or icterus OROPHARYNX: no thrush or ulceration; good dentition  NECK: supple, no masses felt LYMPH: no palpable lymphadenopathy in the cervical, axillary or inguinal regions LUNGS: clear to auscultation and No wheeze or crackles HEART/CVS: regular rate & rhythm and no murmurs; No lower extremity edema ABDOMEN:abdomen soft, non-tender and normal bowel sounds Musculoskeletal:no cyanosis of digits and no clubbing  PSYCH: alert & oriented x 3 with fluent speech NEURO: no focal motor/sensory deficits SKIN: no rashes or significant lesions      LABORATORY DATA:  I have reviewed the data as listed    Component Value Date/Time   NA 136 04/02/2016 1508   NA 135 10/18/2014 1311   K 4.1 04/02/2016 1508   K 4.4 10/18/2014 1311   CL 100 (L) 04/02/2016 1508   CL 103 10/18/2014 1311   CO2 25 04/02/2016 1508   CO2 25 10/18/2014 1311   GLUCOSE 90 04/02/2016 1508   GLUCOSE 89 10/18/2014 1311   BUN 22 (H) 04/02/2016 1508   BUN 24 (H) 10/18/2014 1311   CREATININE 1.11 (H) 04/02/2016 1508   CREATININE 0.90 10/18/2014 1311   CALCIUM 9.7 04/02/2016 1508   CALCIUM 9.3 10/18/2014 1311   PROT 7.7 04/02/2016 1508   PROT 7.3 10/18/2014 1311   ALBUMIN 4.6 04/02/2016 1508   ALBUMIN 4.4 10/18/2014 1311   AST 35 04/02/2016 1508   AST 20 10/18/2014 1311   ALT 37 04/02/2016 1508   ALT 16 10/18/2014 1311   ALKPHOS 76 04/02/2016 1508   ALKPHOS 50 10/18/2014 1311   BILITOT 0.5 04/02/2016 1508   BILITOT 0.4 10/18/2014 1311   GFRNONAA 51 (L) 04/02/2016 1508   GFRNONAA >60 10/18/2014 1311   GFRAA 59 (L) 04/02/2016 1508   GFRAA >60 10/18/2014 1311    No results found for: SPEP, UPEP  Lab Results  Component Value Date   WBC 7.0 04/02/2016   NEUTROABS 3.7 04/02/2016   HGB 14.5 04/02/2016   HCT 43.0 04/02/2016   MCV 93.3 04/02/2016   PLT  236 04/02/2016      Chemistry      Component Value Date/Time   NA 136 04/02/2016 1508   NA 135 10/18/2014 1311   K 4.1 04/02/2016 1508   K 4.4 10/18/2014 1311   CL 100 (L) 04/02/2016 1508   CL 103 10/18/2014 1311   CO2 25 04/02/2016 1508   CO2 25 10/18/2014 1311   BUN 22 (H) 04/02/2016 1508   BUN 24 (H) 10/18/2014 1311   CREATININE 1.11 (H) 04/02/2016 1508   CREATININE 0.90 10/18/2014 1311      Component Value Date/Time   CALCIUM 9.7 04/02/2016 1508   CALCIUM 9.3 10/18/2014 1311   ALKPHOS 76 04/02/2016 1508   ALKPHOS 50 10/18/2014 1311   AST 35 04/02/2016 1508   AST 20 10/18/2014 1311   ALT 37 04/02/2016  1508   ALT 16 10/18/2014 1311   BILITOT 0.5 04/02/2016 1508   BILITOT 0.4 10/18/2014 1311        ASSESSMENT & PLAN:   No problem-specific Assessment & Plan notes found for this encounter.      Kelly Sickle, MD 04/02/2017 1:34 PM

## 2017-04-02 NOTE — Assessment & Plan Note (Deleted)
#  BREAST CANCER LEFT STAGE IIA [T1N1]-ER/PR positive HER-2/neu positive status post chemotherapy-complete pathologic response. Intol to multiple AIs/ Tamoxifen.   # Given the significant intolerance to multiple antiestrogen therapy- recommend discontinuation at this time. Patient understands the risk of recurrence is higher without antiestrogen therapy; however the same time patient had complete pathologic response to neoadjuvant chemotherapy- which should portend a good prognosis   # Follow-up in 12 months/CBC CMP/ mammogram prior.   # 25 minutes face-to-face with the patient discussing the above plan of care; more than 50% of time spent on prognosis/ natural history; counseling and coordination.

## 2017-04-14 ENCOUNTER — Ambulatory Visit: Admit: 2017-04-14 | Payer: Medicare Other | Admitting: Podiatry

## 2017-04-14 SURGERY — CORRECTION, HALLUX VALGUS
Anesthesia: Choice | Laterality: Right

## 2017-04-20 IMAGING — MG MM DIGITAL DIAGNOSTIC UNILAT*L* W/ TOMO W/ CAD
8 of 10 series · 9 of 22 positions shown · non-contrast
Comparison: Prior studies including 01/29/2016

CLINICAL DATA: History of left lumpectomy in 7280. Eighteen month
Follow-up for probably benign left breast calcifications.

EXAM:
2D DIGITAL DIAGNOSTIC UNILATERAL LEFT MAMMOGRAM WITH CAD AND ADJUNCT
TOMO

[L MLO (1 of 2)]
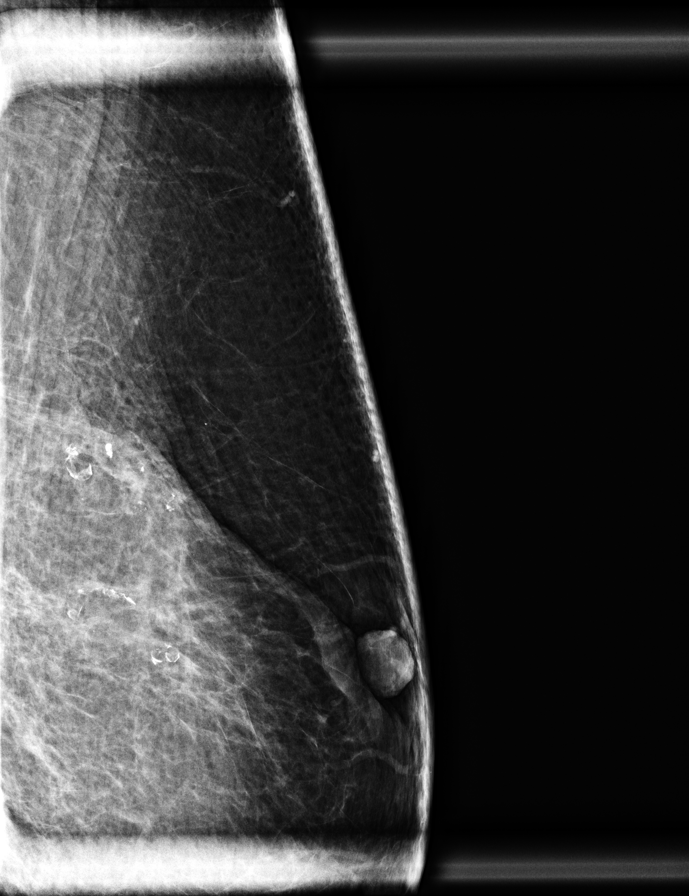

[L MLO (2 of 2)]
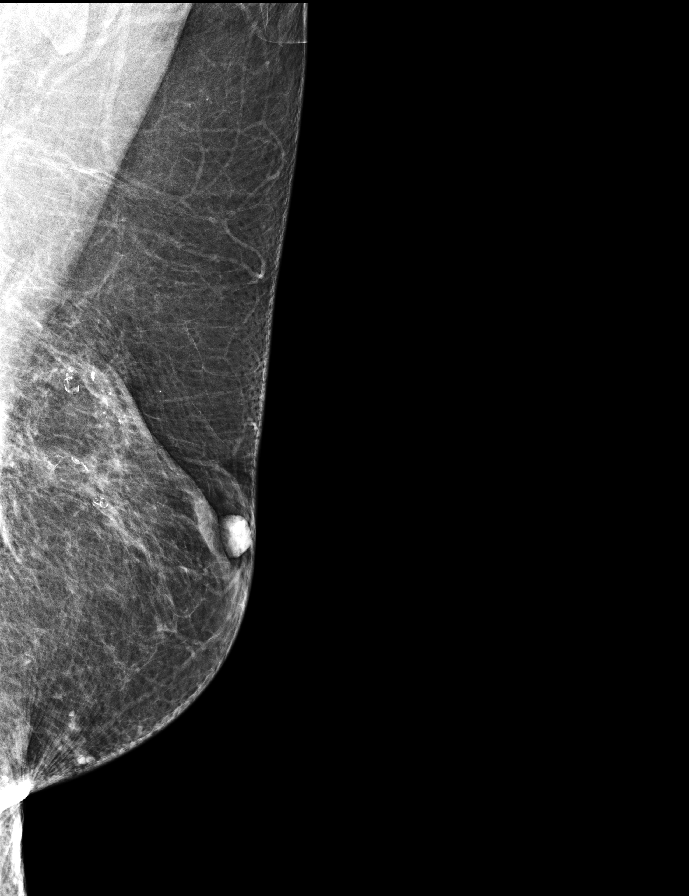

[L ML synth-2D]
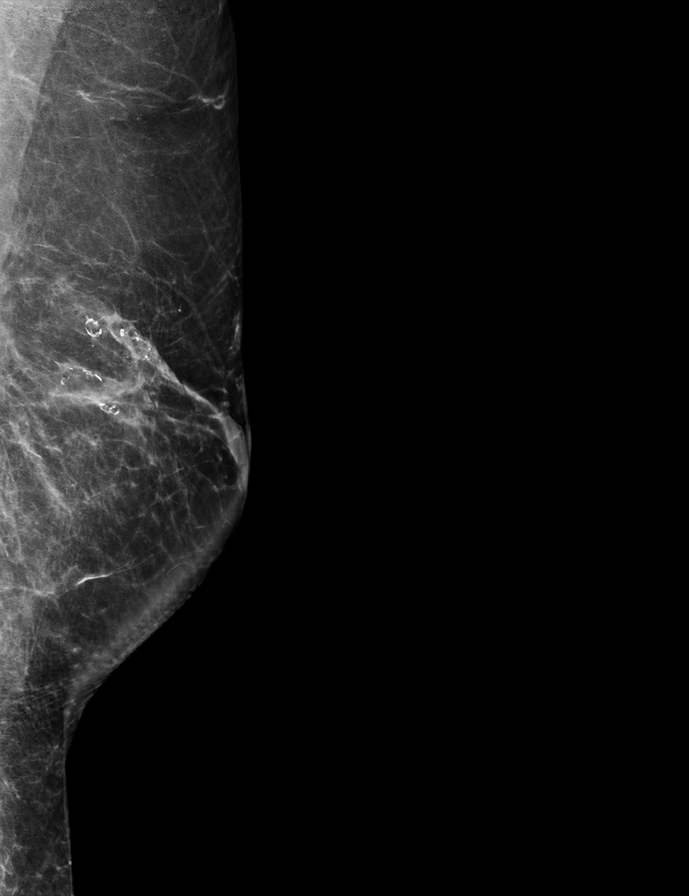

[L MLO synth-2D]
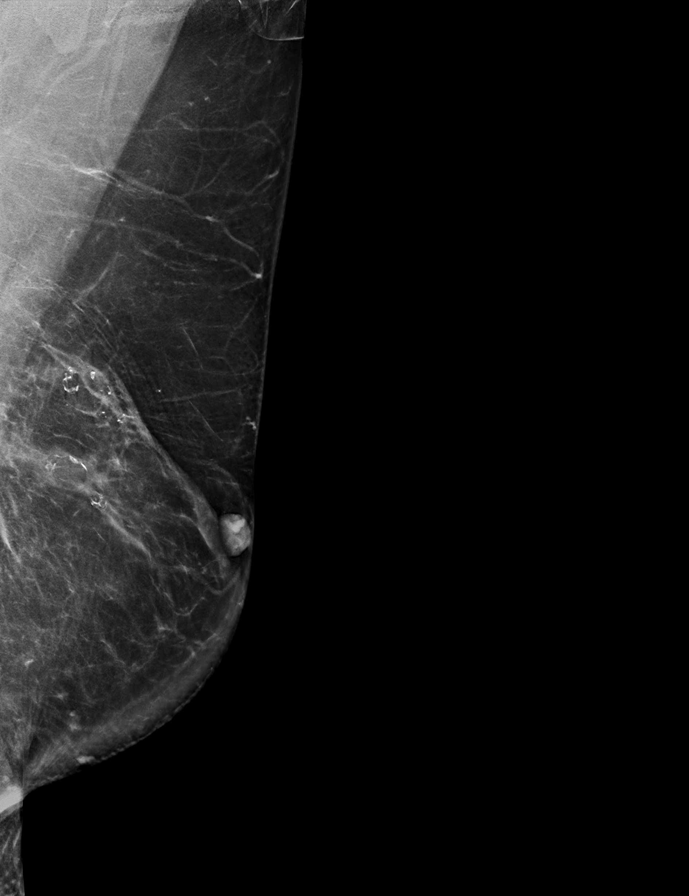

[L CC]
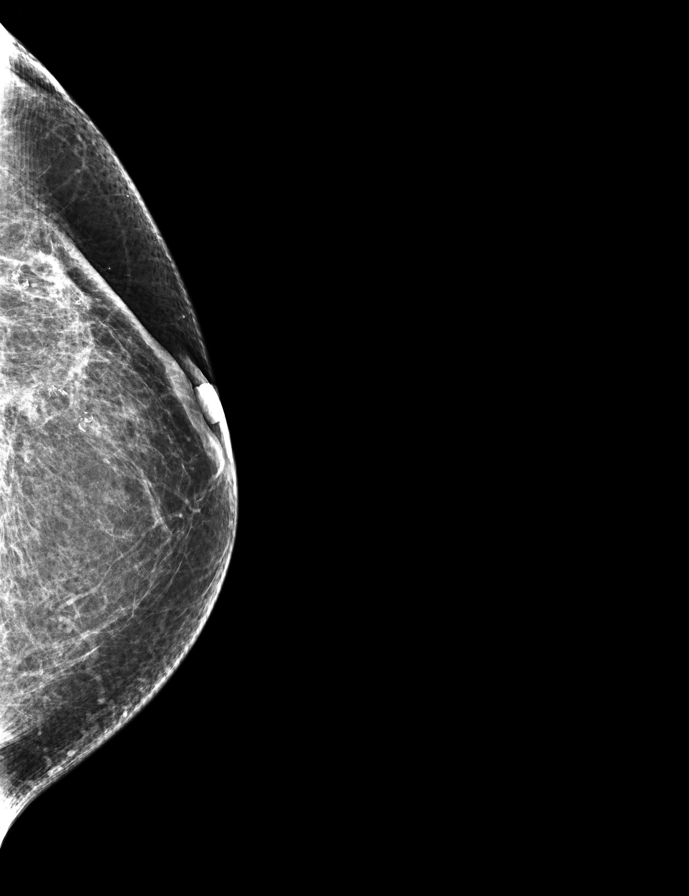

[L CC synth-2D]
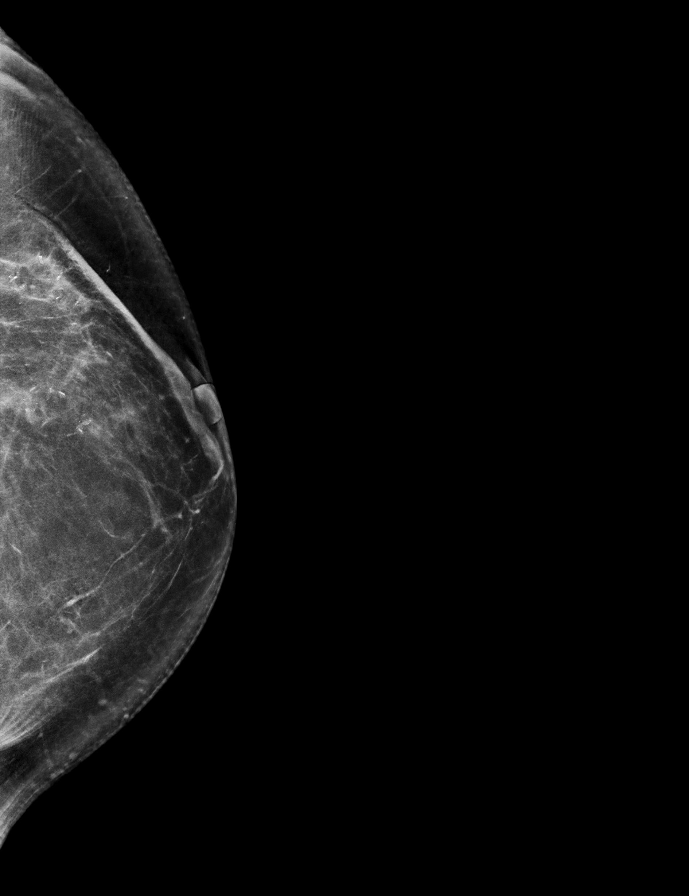

[L ML]
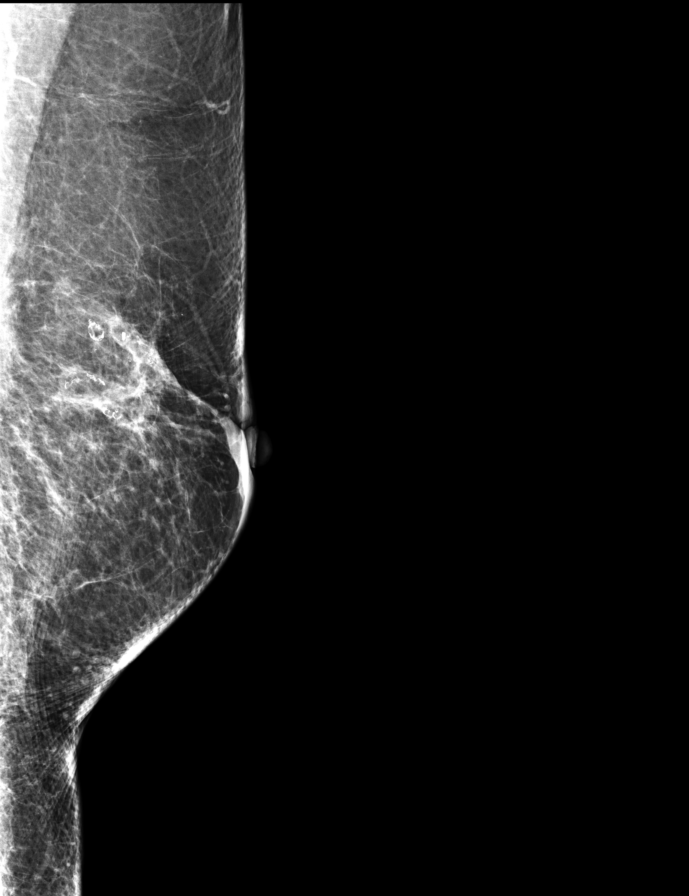

[L MLO tomo · 2 of 79 frames shown]
[frame 26/79]
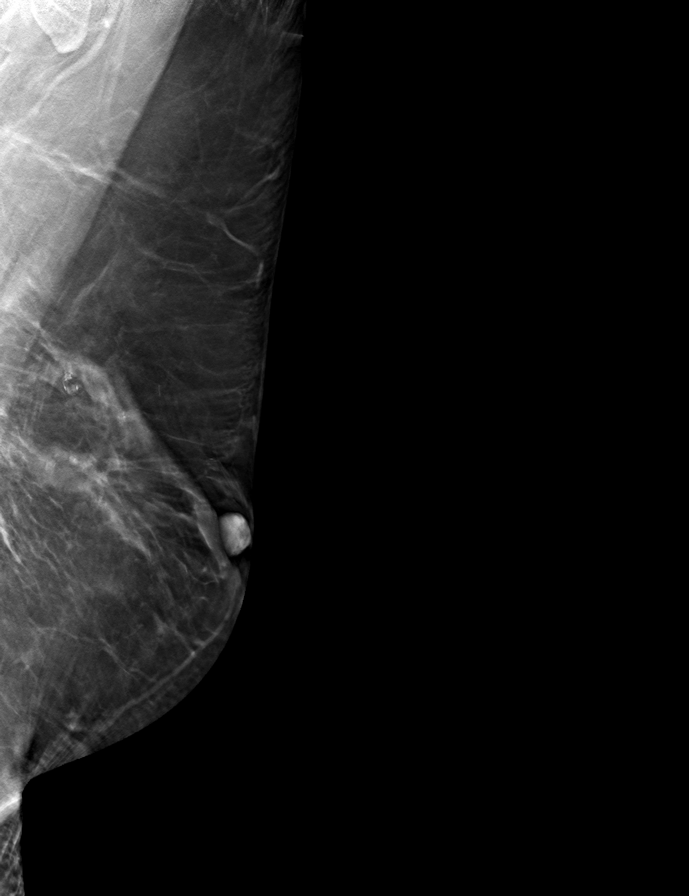
[frame 40/79]
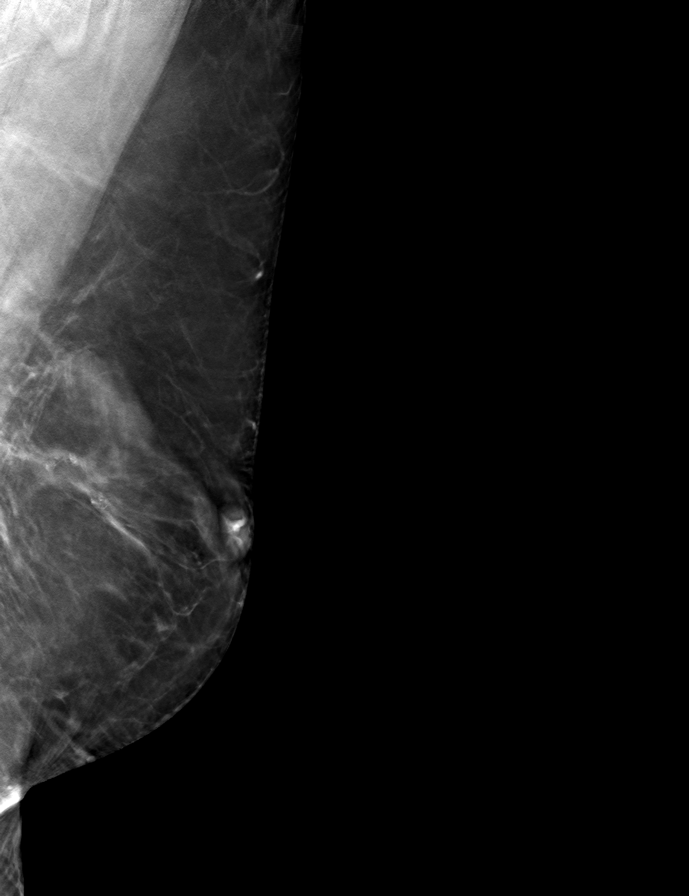

[9 of 22 positions shown; findings below may reference images not displayed]

ACR Breast Density Category b: There are scattered areas of
fibroglandular density.
FINDINGS: Post operative changes are seen in the leftbreast. Within the
lumpectomy scar on the left there are curvilinear and dystrophic
type calcifications consistent with fat necrosis. No suspicious
morphology or distribution of calcifications identified. Right
breast is negative.

Mammographic images were processed with CAD.
IMPRESSION: Stable, benign-appearing left breast calcifications. No mammographic
or ultrasound evidence for malignancy.

RECOMMENDATION:
Bilateral diagnostic mammogram is recommended in January 2017. This
will complete the patient's 2 year followup for left breast
calcifications.

I have discussed the findings and recommendations with the patient.
Results were also provided in writing at the conclusion of the
visit. If applicable, a reminder letter will be sent to the patient
regarding the next appointment.

BI-RADS CATEGORY  3: Probably benign.

## 2017-07-27 ENCOUNTER — Encounter: Payer: Self-pay | Admitting: General Surgery

## 2017-12-15 ENCOUNTER — Other Ambulatory Visit: Payer: Self-pay

## 2017-12-15 DIAGNOSIS — Z170 Estrogen receptor positive status [ER+]: Principal | ICD-10-CM

## 2017-12-15 DIAGNOSIS — C50412 Malignant neoplasm of upper-outer quadrant of left female breast: Secondary | ICD-10-CM

## 2018-02-01 ENCOUNTER — Ambulatory Visit
Admission: RE | Admit: 2018-02-01 | Discharge: 2018-02-01 | Disposition: A | Discharge status: Home / Self Care | Payer: Medicare Other | Source: Ambulatory Visit | Attending: General Surgery | Admitting: General Surgery

## 2018-02-01 DIAGNOSIS — Z170 Estrogen receptor positive status [ER+]: Secondary | ICD-10-CM | POA: Diagnosis present

## 2018-02-01 DIAGNOSIS — C50412 Malignant neoplasm of upper-outer quadrant of left female breast: Secondary | ICD-10-CM

## 2018-02-08 ENCOUNTER — Ambulatory Visit: Payer: Medicare Other | Admitting: General Surgery

## 2018-03-08 ENCOUNTER — Ambulatory Visit (INDEPENDENT_AMBULATORY_CARE_PROVIDER_SITE_OTHER): Payer: Medicare Other | Admitting: General Surgery

## 2018-03-08 ENCOUNTER — Other Ambulatory Visit: Payer: Self-pay

## 2018-03-08 ENCOUNTER — Encounter: Payer: Self-pay | Admitting: General Surgery

## 2018-03-08 VITALS — BP 118/68 | HR 102 | Temp 97.3°F | Resp 12 | Ht 63.0 in | Wt 180.0 lb

## 2018-03-08 DIAGNOSIS — Z853 Personal history of malignant neoplasm of breast: Secondary | ICD-10-CM | POA: Diagnosis not present

## 2018-03-08 NOTE — Progress Notes (Signed)
Patient ID: [name], female   DOB: 09/22/1950, 67 y.o.   MRN: [id number]  Chief Complaint  Patient presents with  . Follow-up    one year f/u recall bil diag mammo [facility] 02/01/18    HPI [name] is a 67 y.o. female here today for one year follow up bilateral diag mammo [facility] 02/01/18. Patient states she feels fine. The patient is recently had several skin cancers removed by the dermatology service. HPI  Past Medical History:  Diagnosis Date  . Allergy   . Breast cancer ([facility])    left- chemo/ radiation  . Breast cancer, left ([facility]) 12/08/2012   left breast, stage 2a, Her2/neu +  . COPD (chronic obstructive pulmonary disease) (H[facility]) 2014   seen on chest xray  . Hepatitis A 1988  . History of chemotherapy    Adriamycin/Cytoxan 12/20/2012-02/27/2013; 12 weekly cycles of Taxol from 03/21/2013-06/06/2013  . History of mammogram 01/2014  . History of radiation therapy    09/13/2013-11/08/2013  . Hypothyroid   . Osteopenia   . Status post radiation therapy 10/2013   Left breast radiation  . Uterine cancer ([facility])     Past Surgical History:  Procedure Laterality Date  . ABDOMINAL HYSTERECTOMY  1979  . BREAST BIOPSY Left 2014   INVASIVE MAMMARY CARCINOMA  . BREAST BIOPSY Left 2018   fat necrosis  . BREAST LUMPECTOMY Left 2014   with Radiation  . BREAST SURGERY Left 08-16-13   Needle/wire localization of Left breast   . CARPAL TUNNEL RELEASE    . COLONOSCOPY  09/14/2012   Paul Oh, MD; Normal exam.   . PORTACATH PLACEMENT  2014  . SPINAL FUSION     x 3    Family History  Problem Relation Age of Onset  . Breast cancer Mother 47    Social History Social History   Tobacco Use  . Smoking status: Never Smoker  . Smokeless tobacco: Never Used  Substance Use Topics  . Alcohol use: No    Alcohol/week: 0.0 standard drinks  . Drug use: No    Allergies  Allergen Reactions  . Prednisone Rash  . Tamoxifen Citrate Other (See Comments)    Constipation and irritability  .  Anastrozole Other (See Comments)    constipation , irritability  . Codeine Nausea And Vomiting    sick  . Dilaudid [Hydromorphone Hcl]   . Letrozole Hives  . Other Other (See Comments)    congestion  . Prednisone Rash  . Sulfa Antibiotics Rash and Itching  . Tramadol Nausea And Vomiting, Itching and Rash    Current Outpatient Medications  Medication Sig Dispense Refill  . Biotin (BIOTIN MAXIMUM STRENGTH) 10 MG TABS Take 10,000 capsules by mouth daily.    . levothyroxine (SYNTHROID, LEVOTHROID) 75 MCG tablet Take 75 mcg by mouth daily before breakfast.    . aspirin 81 MG tablet Take 81 mg by mouth daily.    . Cholecalciferol (VITAMIN D3) 1000 units CAPS Take by mouth.    . montelukast (SINGULAIR) 10 MG tablet Take by mouth.    . Multiple Vitamins-Minerals (CENTRUM PO) Take by mouth.     No current facility-administered medications for this visit.    Facility-Administered Medications Ordered in Other Visits  Medication Dose Route Frequency Provider Last Rate Last Dose  . fulvestrant (FASLODEX) injection 500 mg  500 mg Intramuscular Q30 days Corcoran, Melissa C, MD   500 mg at 11/16/14 1558    Review of Systems Review of Systems    Constitutional: Negative.   Respiratory: Negative.   Cardiovascular: Negative.     Blood pressure 118/68, pulse (!) 102, temperature (!) 97.3 F (36.3 C), temperature source Temporal, resp. rate 12, height 5' 3" (1.6 m), weight 180 lb (81.6 kg), SpO2 93 %.  Physical Exam Physical Exam  Constitutional: She is oriented to person, place, and time. She appears well-developed and well-nourished.  Eyes: Conjunctivae are normal. No scleral icterus.  Neck: Normal range of motion.  Cardiovascular: Normal rate, regular rhythm and normal heart sounds.  Pulmonary/Chest: Effort normal and breath sounds normal. Right breast exhibits no inverted nipple, no mass, no nipple discharge, no skin change and no tenderness. Left breast exhibits no inverted nipple, no  mass, no nipple discharge, no skin change and no tenderness. Breasts are symmetrical.    Lymphadenopathy:    She has no cervical adenopathy.    She has no axillary adenopathy.       Right: No supraclavicular adenopathy present.       Left: No supraclavicular adenopathy present.  Neurological: She is alert and oriented to person, place, and time.  Skin: Skin is warm and dry.    Data Reviewed February 01, 2018 bilateral diagnostic mammograms reviewed.  Postsurgical changes.  BI-RADS-2.  Assessment    No evidence of recurrent breast cancer.    Plan Multiple skin cancers, likely secondary to significant sun, tanning bed exposure.  Screening for bilateral mammogram in 1 year. Follow up in 1 year HPI, Physical Exam, Assessment and Plan have been scribed under the direction and in the presence of Jeffrey Byrnett, Md.  Randa R. Dickerson, CMA   I have completed the exam and reviewed the above documentation for accuracy and completeness.  I agree with the above.  [employer] has been used and any errors in dictation or transcription are unintentional.  Jeffrey Byrnett, M.D., F.A.C.S.   Jeffrey W Byrnett 03/08/2018, 9:22 PM   

## 2018-03-08 NOTE — Patient Instructions (Addendum)
Screening for bilateral mammogram in 1 year. Follow up in 1 year

## 2018-12-19 ENCOUNTER — Other Ambulatory Visit: Payer: Self-pay | Admitting: *Deleted

## 2018-12-19 DIAGNOSIS — Z1231 Encounter for screening mammogram for malignant neoplasm of breast: Secondary | ICD-10-CM

## 2019-01-09 ENCOUNTER — Encounter: Payer: Self-pay | Admitting: General Surgery

## 2019-01-10 ENCOUNTER — Encounter: Payer: Self-pay | Admitting: General Surgery

## 2019-02-08 ENCOUNTER — Ambulatory Visit
Admission: RE | Admit: 2019-02-08 | Discharge: 2019-02-08 | Disposition: A | Discharge status: Home / Self Care | Payer: Medicare Other | Source: Ambulatory Visit | Attending: General Surgery | Admitting: General Surgery

## 2019-02-08 DIAGNOSIS — Z1231 Encounter for screening mammogram for malignant neoplasm of breast: Secondary | ICD-10-CM

## 2019-02-08 HISTORY — DX: Personal history of irradiation: Z92.3

## 2019-02-08 HISTORY — DX: Personal history of antineoplastic chemotherapy: Z92.21

## 2019-03-01 ENCOUNTER — Ambulatory Visit: Payer: Medicare Other | Admitting: Surgery

## 2019-04-19 ENCOUNTER — Emergency Department
Admission: EM | Admit: 2019-04-19 | Discharge: 2019-04-19 | Disposition: A | Discharge status: Home / Self Care | Payer: Medicare Other | Attending: Emergency Medicine | Admitting: Emergency Medicine

## 2019-04-19 ENCOUNTER — Encounter: Payer: Self-pay | Admitting: Emergency Medicine

## 2019-04-19 ENCOUNTER — Other Ambulatory Visit: Payer: Self-pay

## 2019-04-19 ENCOUNTER — Emergency Department: Payer: Medicare Other

## 2019-04-19 DIAGNOSIS — Z923 Personal history of irradiation: Secondary | ICD-10-CM | POA: Insufficient documentation

## 2019-04-19 DIAGNOSIS — R0602 Shortness of breath: Secondary | ICD-10-CM | POA: Diagnosis not present

## 2019-04-19 DIAGNOSIS — Z7982 Long term (current) use of aspirin: Secondary | ICD-10-CM | POA: Insufficient documentation

## 2019-04-19 DIAGNOSIS — Z79899 Other long term (current) drug therapy: Secondary | ICD-10-CM | POA: Diagnosis not present

## 2019-04-19 DIAGNOSIS — R002 Palpitations: Secondary | ICD-10-CM | POA: Diagnosis not present

## 2019-04-19 DIAGNOSIS — I4891 Unspecified atrial fibrillation: Secondary | ICD-10-CM | POA: Insufficient documentation

## 2019-04-19 DIAGNOSIS — J449 Chronic obstructive pulmonary disease, unspecified: Secondary | ICD-10-CM | POA: Insufficient documentation

## 2019-04-19 DIAGNOSIS — Z9221 Personal history of antineoplastic chemotherapy: Secondary | ICD-10-CM | POA: Diagnosis not present

## 2019-04-19 DIAGNOSIS — Z853 Personal history of malignant neoplasm of breast: Secondary | ICD-10-CM | POA: Insufficient documentation

## 2019-04-19 DIAGNOSIS — R0789 Other chest pain: Secondary | ICD-10-CM | POA: Diagnosis present

## 2019-04-19 LAB — CBC
HCT: 45.1 % (ref 36.0–46.0)
Hemoglobin: 14.8 g/dL (ref 12.0–15.0)
MCH: 30.1 pg (ref 26.0–34.0)
MCHC: 32.8 g/dL (ref 30.0–36.0)
MCV: 91.7 fL (ref 80.0–100.0)
Platelets: 266 10*3/uL (ref 150–400)
RBC: 4.92 MIL/uL (ref 3.87–5.11)
RDW: 12.9 % (ref 11.5–15.5)
WBC: 12.6 10*3/uL — ABNORMAL HIGH (ref 4.0–10.5)
nRBC: 0 % (ref 0.0–0.2)

## 2019-04-19 LAB — BASIC METABOLIC PANEL
Anion gap: 11 (ref 5–15)
BUN: 22 mg/dL (ref 8–23)
CO2: 25 mmol/L (ref 22–32)
Calcium: 9.5 mg/dL (ref 8.9–10.3)
Chloride: 102 mmol/L (ref 98–111)
Creatinine, Ser: 1.21 mg/dL — ABNORMAL HIGH (ref 0.44–1.00)
GFR calc Af Amer: 53 mL/min — ABNORMAL LOW (ref >60)
GFR calc non Af Amer: 46 mL/min — ABNORMAL LOW (ref >60)
Glucose, Bld: 108 mg/dL — ABNORMAL HIGH (ref 70–99)
Potassium: 3.5 mmol/L (ref 3.5–5.1)
Sodium: 138 mmol/L (ref 135–145)

## 2019-04-19 LAB — TROPONIN I (HIGH SENSITIVITY): Troponin I (High Sensitivity): 8 ng/L (ref <18)

## 2019-04-19 MED ORDER — DILTIAZEM HCL 25 MG/5ML IV SOLN
20.0000 mg | Freq: Once | INTRAVENOUS | Status: AC
  Administered 2019-04-19: 18:00:00 20 mg via INTRAVENOUS

## 2019-04-19 MED ORDER — DILTIAZEM HCL 25 MG/5ML IV SOLN
INTRAVENOUS | Status: AC
  Administered 2019-04-19: 18:00:00 20 mg via INTRAVENOUS
  Filled 2019-04-19: qty 5

## 2019-04-19 MED ORDER — SODIUM CHLORIDE 0.9 % IV BOLUS
1000.0000 mL | Freq: Once | INTRAVENOUS | Status: AC
  Administered 2019-04-19: 1000 mL via INTRAVENOUS

## 2019-04-19 NOTE — ED Provider Notes (Signed)
[facility] Emergency Department Provider Note  Time seen: 6:24 PM  I have reviewed the triage vital signs and the nursing notes.   HISTORY  Chief Complaint Chest Pain   HPI [name] is a 68 y.o. female with a past medical history of COPD, hepatitis, presents to the emergency department for chest discomfort and her heart racing.  According to the patient at 4:20 PM while she was sitting she developed significant heart racing, mild chest tightness and shortness of breath.  Patient denies any history of a fast or irregular heartbeat in the past.  Patient continues to state mild chest tightness with mild shortness of breath.  Patient is currently appears to be in A. fib RVR with a heart rate around 170.  Patient denies any history of A. fib in the past.  No fever or cough.  Past Medical History:  Diagnosis Date  . Allergy   . Breast cancer ([place])    left- chemo/ radiation  . Breast cancer, left ([place]) 12/08/2012   left breast, stage 2a, Her2/neu +  . COPD (chronic obstructive pulmonary disease) ([place]) 2014   seen on chest xray  . Hepatitis A 1988  . History of chemotherapy    Adriamycin/Cytoxan 12/20/2012-02/27/2013; 12 weekly cycles of Taxol from 03/21/2013-06/06/2013  . History of mammogram 01/2014  . History of radiation therapy    09/13/2013-11/08/2013  . Hypothyroid   . Osteopenia   . Personal history of chemotherapy   . Personal history of radiation therapy   . Status post radiation therapy 10/2013   Left breast radiation  . Uterine cancer [facility])     Patient Active Problem List   Diagnosis Date Noted  . History of left breast cancer 02/03/2017  . Breast microcalcifications 02/12/2016  . Constipation 10/31/2015  . Acquired hypothyroidism 08/16/2014  . Osteoporosis, unspecified 02/09/2014  . Carcinoma of upper-outer quadrant of left breast in female, estrogen receptor positive ([place]) 12/08/2012    Past Surgical History:  Procedure Laterality Date  .  ABDOMINAL HYSTERECTOMY  1979  . BREAST BIOPSY Left 2014   INVASIVE MAMMARY CARCINOMA  . BREAST BIOPSY Left 2018   fat necrosis  . BREAST LUMPECTOMY Left 2014   with Radiation  . BREAST SURGERY Left 08-16-13   Needle/wire localization of Left breast   . CARPAL TUNNEL RELEASE    . COLONOSCOPY  09/14/2012   Verdie Shire, MD; Normal exam.   . PORTACATH PLACEMENT  2014  . SPINAL FUSION     x 3    Prior to Admission medications   Medication Sig Start Date End Date Taking? Authorizing Provider  aspirin 81 MG tablet Take 81 mg by mouth daily.    [provider]  Biotin (BIOTIN MAXIMUM STRENGTH) 10 MG TABS Take 10,000 capsules by mouth daily.    [provider]  Cholecalciferol (VITAMIN D3) 1000 units CAPS Take by mouth.    [provider]  levothyroxine (SYNTHROID, LEVOTHROID) 75 MCG tablet Take 75 mcg by mouth daily before breakfast.    [provider]  montelukast (SINGULAIR) 10 MG tablet Take by mouth. 05/18/17   [provider]  Multiple Vitamins-Minerals (CENTRUM PO) Take by mouth.    [provider]    Allergies  Allergen Reactions  . Prednisone Rash  . Tamoxifen Citrate Other (See Comments)    Constipation and irritability  . Anastrozole Other (See Comments)    constipation , irritability  . Codeine Nausea And Vomiting    sick  .  Dilaudid [Hydromorphone Hcl]   . Letrozole Hives  . Other Other (See Comments)    congestion  . Prednisone Rash  . Sulfa Antibiotics Rash and Itching  . Tramadol Nausea And Vomiting, Itching and Rash    Family History  Problem Relation Age of Onset  . Breast cancer Mother 75  . Breast cancer Daughter     Social History Social History   Tobacco Use  . Smoking status: Never Smoker  . Smokeless tobacco: Never Used  Substance Use Topics  . Alcohol use: No    Alcohol/week: 0.0 standard drinks  . Drug use: No    Review of Systems Constitutional: Negative for fever. Cardiovascular: Mild  chest tightness.  Positive for palpitations. Respiratory: Mild shortness of breath. Gastrointestinal: Negative for abdominal pain Musculoskeletal: Negative for musculoskeletal complaints Skin: Negative for skin complaints  Neurological: Negative for headache All other ROS negative  ____________________________________________   PHYSICAL EXAM:  VITAL SIGNS: ED Triage Vitals  Enc Vitals Group     BP 04/19/19 1809 (!) 150/110     Pulse Rate 04/19/19 1809 (!) 148     Resp 04/19/19 1809 20     Temp 04/19/19 1809 98.6 F (37 C)     Temp Source 04/19/19 1809 Oral     SpO2 04/19/19 1809 96 %     Weight 04/19/19 1810 177 lb (80.3 kg)     Height 04/19/19 1810 5' 3" (1.6 m)     Head Circumference --      Peak Flow --      Pain Score 04/19/19 1818 4     Pain Loc --      Pain Edu? --      Excl. in [place]? --    Constitutional: Alert and oriented. Well appearing and in no distress. Eyes: Normal exam ENT      Head: Normocephalic and atraumatic.      Mouth/Throat: Mucous membranes are moist. Cardiovascular: Irregular rhythm rate around 150 bpm. Respiratory: Normal respiratory effort without tachypnea nor retractions. Breath sounds are clear Gastrointestinal: Soft and nontender. No distention. Musculoskeletal: Nontender with normal range of motion in all extremities. No lower extremity tenderness or edema. Neurologic:  Normal speech and language. No gross focal neurologic deficits  Skin:  Skin is warm, dry and intact.  Psychiatric: Mood and affect are normal.   ____________________________________________    EKG  EKG viewed and interpreted by myself shows atrial fibrillation with a rapid ventricular response at 148 bpm with a narrow QRS, normal axis, largely normal intervals otherwise, nonspecific ST changes without ST elevation.  Repeat EKG viewed and interpreted by myself 19: 20: 56 shows a normal sinus rhythm at 91 bpm with a narrow QRS, normal axis, normal intervals, no concerning  ST changes.  Reassuring EKG.  ____________________________________________    RADIOLOGY  Chest x-ray shows mild vascular congestion without edema  ____________________________________________   INITIAL IMPRESSION / ASSESSMENT AND PLAN / ED COURSE  Pertinent labs & imaging results that were available during my care of the patient were reviewed by me and considered in my medical decision making (see chart for details).   Patient presents to the emergency department for palpitations shortness of breath and chest tightness.  Patient currently appears to be in atrial fibrillation with rapid ventricular response around 160 to 170 bpm.  No history of A. fib in the past.  States mild chest tightness.  We will check labs including cardiac enzymes.  We will dose IV diltiazem, IV fluids and continue  to closely monitor the patient.  Patient's lab work is resulted largely within normal limits including a negative troponin.  Chest x-ray shows vascular congestion without edema.  Repeat EKG appears to show the patient has converted back to a normal sinus rhythm currently 91 bpm.  Patient states her symptoms have since resolved.   [name] was evaluated in Emergency Department on 04/19/2019 for the symptoms described in the history of present illness. She was evaluated in the context of the global COVID-19 pandemic, which necessitated consideration that the patient might be at risk for infection with the SARS-CoV-2 virus that causes COVID-19. Institutional protocols and algorithms that pertain to the evaluation of patients at risk for COVID-19 are in a state of rapid change based on information released by regulatory bodies including the CDC and federal and state organizations. These policies and algorithms were followed during the patient's care in the ED.  CRITICAL CARE Performed by: Harvest Dark   Total critical care time: 30 minutes  Critical care time was exclusive of separately billable  procedures and treating other patients.  Critical care was necessary to treat or prevent imminent or life-threatening deterioration.  Critical care was time spent personally by me on the following activities: development of treatment plan with patient and/or surrogate as well as nursing, discussions with consultants, evaluation of patient's response to treatment, examination of patient, obtaining history from patient or surrogate, ordering and performing treatments and interventions, ordering and review of laboratory studies, ordering and review of radiographic studies, pulse oximetry and re-evaluation of patient's condition.   ____________________________________________   FINAL CLINICAL IMPRESSION(S) / ED DIAGNOSES  New onset atrial fibrillation with rapid ventricular response   Harvest Dark, MD 04/19/19 1926

## 2019-04-19 NOTE — Discharge Instructions (Signed)
Please call the number provided for cardiology to arrange a follow-up appointment within the next 2 to 3 days for recheck/reevaluation.  Return to the emergency department for any shortness of breath chest pain or return of significant palpitations/racing heart rate, or any other symptom personally concerning to yourself.

## 2019-04-19 NOTE — ED Notes (Signed)
Patient given water per EDP. Family at bedside with patient

## 2019-04-19 NOTE — ED Notes (Signed)
Patient in afib with rvr on initial ekg. HR in 160's. EDP aware and at bedside.

## 2019-04-19 NOTE — ED Triage Notes (Signed)
Patient reports chest pain that started at 1620 today. Reports heaviness in center of chest. Patient reports SOB as well. Denies history of afib or tachycardia.

## 2019-04-19 NOTE — ED Notes (Signed)
Pt ambulated to toilet, well tolerated VS stable

## 2019-04-19 NOTE — ED Triage Notes (Signed)
FIRST NURSE NOTE- pt c/o chest pain. Pulled for EKG

## 2019-05-04 ENCOUNTER — Other Ambulatory Visit: Payer: Self-pay | Admitting: General Surgery

## 2019-05-04 DIAGNOSIS — M85859 Other specified disorders of bone density and structure, unspecified thigh: Secondary | ICD-10-CM

## 2019-05-04 DIAGNOSIS — Z79811 Long term (current) use of aromatase inhibitors: Secondary | ICD-10-CM

## 2019-05-04 DIAGNOSIS — Z853 Personal history of malignant neoplasm of breast: Secondary | ICD-10-CM

## 2019-05-04 NOTE — Progress Notes (Unsigned)
Bone density

## 2019-05-17 ENCOUNTER — Other Ambulatory Visit: Payer: Medicare Other

## 2019-05-22 ENCOUNTER — Other Ambulatory Visit: Payer: Medicare Other

## 2020-01-03 ENCOUNTER — Other Ambulatory Visit: Payer: Self-pay | Admitting: General Surgery

## 2020-01-03 DIAGNOSIS — Z1231 Encounter for screening mammogram for malignant neoplasm of breast: Secondary | ICD-10-CM

## 2020-02-09 ENCOUNTER — Other Ambulatory Visit: Payer: Self-pay

## 2020-02-09 ENCOUNTER — Ambulatory Visit
Admission: RE | Admit: 2020-02-09 | Discharge: 2020-02-09 | Disposition: A | Discharge status: Home / Self Care | Payer: Medicare Other | Source: Ambulatory Visit | Attending: General Surgery | Admitting: General Surgery

## 2020-02-09 DIAGNOSIS — Z1231 Encounter for screening mammogram for malignant neoplasm of breast: Secondary | ICD-10-CM | POA: Insufficient documentation

## 2020-12-31 ENCOUNTER — Other Ambulatory Visit: Payer: Self-pay | Admitting: General Surgery

## 2020-12-31 DIAGNOSIS — Z1231 Encounter for screening mammogram for malignant neoplasm of breast: Secondary | ICD-10-CM

## 2020-12-31 DIAGNOSIS — C50412 Malignant neoplasm of upper-outer quadrant of left female breast: Secondary | ICD-10-CM

## 2021-02-11 ENCOUNTER — Other Ambulatory Visit: Payer: Self-pay

## 2021-02-11 ENCOUNTER — Ambulatory Visit
Admission: RE | Admit: 2021-02-11 | Discharge: 2021-02-11 | Disposition: A | Discharge status: Home / Self Care | Payer: Medicare Other | Source: Ambulatory Visit | Attending: General Surgery | Admitting: General Surgery

## 2021-02-11 DIAGNOSIS — Z1231 Encounter for screening mammogram for malignant neoplasm of breast: Secondary | ICD-10-CM | POA: Insufficient documentation

## 2021-02-13 ENCOUNTER — Other Ambulatory Visit: Payer: Self-pay | Admitting: General Surgery

## 2021-02-13 DIAGNOSIS — N6310 Unspecified lump in the right breast, unspecified quadrant: Secondary | ICD-10-CM

## 2021-02-13 DIAGNOSIS — R928 Other abnormal and inconclusive findings on diagnostic imaging of breast: Secondary | ICD-10-CM

## 2021-02-18 ENCOUNTER — Other Ambulatory Visit: Payer: Self-pay

## 2021-02-18 ENCOUNTER — Ambulatory Visit
Admission: RE | Admit: 2021-02-18 | Discharge: 2021-02-18 | Disposition: A | Discharge status: Home / Self Care | Payer: Medicare Other | Source: Ambulatory Visit | Attending: General Surgery | Admitting: General Surgery

## 2021-02-18 ENCOUNTER — Other Ambulatory Visit: Payer: Self-pay | Admitting: General Surgery

## 2021-02-18 DIAGNOSIS — R928 Other abnormal and inconclusive findings on diagnostic imaging of breast: Secondary | ICD-10-CM

## 2021-02-18 DIAGNOSIS — N6310 Unspecified lump in the right breast, unspecified quadrant: Secondary | ICD-10-CM | POA: Insufficient documentation

## 2021-02-20 LAB — SURGICAL PATHOLOGY

## 2021-12-15 ENCOUNTER — Other Ambulatory Visit: Payer: Self-pay | Admitting: General Surgery

## 2021-12-15 DIAGNOSIS — Z1231 Encounter for screening mammogram for malignant neoplasm of breast: Secondary | ICD-10-CM

## 2021-12-15 DIAGNOSIS — Z170 Estrogen receptor positive status [ER+]: Secondary | ICD-10-CM

## 2022-02-23 ENCOUNTER — Encounter: Payer: Self-pay | Admitting: Hematology and Oncology

## 2022-03-02 ENCOUNTER — Ambulatory Visit
Admission: RE | Admit: 2022-03-02 | Discharge: 2022-03-02 | Disposition: A | Discharge status: Home / Self Care | Payer: PPO | Source: Ambulatory Visit | Attending: General Surgery | Admitting: General Surgery

## 2022-03-02 DIAGNOSIS — Z1231 Encounter for screening mammogram for malignant neoplasm of breast: Secondary | ICD-10-CM | POA: Insufficient documentation

## 2022-03-03 ENCOUNTER — Other Ambulatory Visit: Payer: Self-pay | Admitting: General Surgery

## 2022-04-21 ENCOUNTER — Encounter: Payer: Self-pay | Admitting: Internal Medicine

## 2022-04-21 ENCOUNTER — Ambulatory Visit (INDEPENDENT_AMBULATORY_CARE_PROVIDER_SITE_OTHER): Payer: PPO | Admitting: Internal Medicine

## 2022-04-21 ENCOUNTER — Encounter: Payer: Self-pay | Admitting: Hematology and Oncology

## 2022-04-21 VITALS — BP 126/84 | HR 43 | Temp 96.7°F | Ht 63.0 in | Wt 185.0 lb

## 2022-04-21 DIAGNOSIS — R7303 Prediabetes: Secondary | ICD-10-CM | POA: Diagnosis not present

## 2022-04-21 DIAGNOSIS — E66811 Obesity, class 1: Secondary | ICD-10-CM | POA: Insufficient documentation

## 2022-04-21 DIAGNOSIS — K5909 Other constipation: Secondary | ICD-10-CM

## 2022-04-21 DIAGNOSIS — Z8542 Personal history of malignant neoplasm of other parts of uterus: Secondary | ICD-10-CM

## 2022-04-21 DIAGNOSIS — M810 Age-related osteoporosis without current pathological fracture: Secondary | ICD-10-CM

## 2022-04-21 DIAGNOSIS — E6609 Other obesity due to excess calories: Secondary | ICD-10-CM | POA: Insufficient documentation

## 2022-04-21 DIAGNOSIS — E663 Overweight: Secondary | ICD-10-CM | POA: Insufficient documentation

## 2022-04-21 DIAGNOSIS — Z853 Personal history of malignant neoplasm of breast: Secondary | ICD-10-CM | POA: Diagnosis not present

## 2022-04-21 DIAGNOSIS — E039 Hypothyroidism, unspecified: Secondary | ICD-10-CM | POA: Diagnosis not present

## 2022-04-21 DIAGNOSIS — I480 Paroxysmal atrial fibrillation: Secondary | ICD-10-CM

## 2022-04-21 DIAGNOSIS — Z6832 Body mass index (BMI) 32.0-32.9, adult: Secondary | ICD-10-CM

## 2022-04-21 NOTE — Patient Instructions (Signed)

## 2022-04-21 NOTE — Assessment & Plan Note (Signed)
Encouraged high-fiber diet and adequate water intake Continue mag citrate as needed

## 2022-04-21 NOTE — Progress Notes (Signed)
HPI  Patient presents to clinic today to establish care and for management of the conditions listed below.  Hypothyroidism: She denies any issues on her current dose of Levothyroxine.  She does not follow with endocrinology.  History of Left Breast Cancer: In remission status post lumpectomy, chemo and radiation. She no longer follows with oncology.  History of Uterine Cancer: In remission status post hysterectomy. She no longer follows with oncology.  Chronic Constipation: Managed with Mag Citrate as needed. Colonoscopy from 08/2012 reviewed.  Osteoporosis: She takes Vitamin D OTC.  She does not get weightbearing exercise daily.  Bone density from 10/2016 reviewed.  Prediabetes: Her last A1c was 6%, 08/2020.  She is no longer taking Metformin as prescribed.  She does not check her sugars.  Paroxysmal A-fib: She is not currently taking any medications for this.  She does follow with cardiology.  Past Medical History:  Diagnosis Date   Allergy    Breast cancer ([place])    left- chemo/ radiation   Breast cancer, left ([place]) 12/08/2012   left breast, stage 2a, Her2/neu +   COPD (chronic obstructive pulmonary disease) ([place]) 2014   seen on chest xray   Hepatitis A 1988   History of chemotherapy    Adriamycin/Cytoxan 12/20/2012-02/27/2013; 12 weekly cycles of Taxol from 03/21/2013-06/06/2013   History of mammogram 01/2014   History of radiation therapy    09/13/2013-11/08/2013   Hypothyroid    Osteopenia    Personal history of chemotherapy    Personal history of radiation therapy    Status post radiation therapy 10/2013   Left breast radiation   Uterine cancer (HCC)     Current Outpatient Medications  Medication Sig Dispense Refill   aspirin 81 MG tablet Take 81 mg by mouth daily.     Biotin (BIOTIN MAXIMUM STRENGTH) 10 MG TABS Take 10 mg by mouth daily.      Cholecalciferol (VITAMIN D3) 1000 units CAPS Take 1 capsule by mouth daily.      levothyroxine (SYNTHROID, LEVOTHROID) 75 MCG tablet  Take 75 mcg by mouth daily before breakfast.     metFORMIN (GLUCOPHAGE) 500 MG tablet Take 500 mg by mouth daily with breakfast.     montelukast (SINGULAIR) 10 MG tablet Take 10 mg by mouth at bedtime.      Multiple Vitamins-Minerals (CENTRUM PO) Take by mouth.     No current facility-administered medications for this visit.   Facility-Administered Medications Ordered in Other Visits  Medication Dose Route Frequency Provider Last Rate Last Admin   fulvestrant (FASLODEX) injection 500 mg  500 mg Intramuscular Q30 days Lequita Asal, MD   500 mg at 11/16/14 1558    Allergies  Allergen Reactions   Prednisone Rash   Tamoxifen Citrate Other (See Comments)    Constipation and irritability   Anastrozole Other (See Comments)    constipation , irritability   Codeine Nausea And Vomiting    sick   Dilaudid [Hydromorphone Hcl]    Letrozole Hives   Other Other (See Comments)    congestion   Prednisone Rash   Sulfa Antibiotics Rash and Itching   Tramadol Nausea And Vomiting, Itching and Rash    Family History  Problem Relation Age of Onset   Breast cancer Mother 42   Breast cancer Daughter     Social History   Socioeconomic History   Marital status: Single    Spouse name: Not on file   Number of children: Not on file   Years of education: Not  on file   Highest education level: Not on file  Occupational History   Not on file  Tobacco Use   Smoking status: Never   Smokeless tobacco: Never  Substance and Sexual Activity   Alcohol use: No    Alcohol/week: 0.0 standard drinks of alcohol   Drug use: No   Sexual activity: Not on file  Other Topics Concern   Not on file  Social History Narrative   Not on file   Social Determinants of Health   Financial Resource Strain: Not on file  Food Insecurity: Not on file  Transportation Needs: Not on file  Physical Activity: Not on file  Stress: Not on file  Social Connections: Not on file  Intimate Partner Violence: Not on  file    ROS:  Constitutional: Denies fever, malaise, fatigue, headache or abrupt weight changes.  HEENT: Denies eye pain, eye redness, ear pain, ringing in the ears, wax buildup, runny nose, nasal congestion, bloody nose, or sore throat. Respiratory: Denies difficulty breathing, shortness of breath, cough or sputum production.   Cardiovascular: Denies chest pain, chest tightness, palpitations or swelling in the hands or feet.  Gastrointestinal: Patient reports constipation.  Denies abdominal pain, bloating, diarrhea or blood in the stool.  GU: Denies frequency, urgency, pain with urination, blood in urine, odor or discharge. Musculoskeletal: Denies decrease in range of motion, difficulty with gait, muscle pain or joint pain and swelling.  Skin: Denies redness, rashes, lesions or ulcercations.  Neurological: Denies dizziness, difficulty with memory, difficulty with speech or problems with balance and coordination.  Psych: Denies anxiety, depression, SI/HI.  No other specific complaints in a complete review of systems (except as listed in HPI above).  PE: BP 126/84 (BP Location: Left Arm, Patient Position: Sitting, Cuff Size: Normal)   Pulse (!) 43   Temp (!) 96.7 F (35.9 C) (Temporal)   Ht 5' 3" (1.6 m)   Wt 185 lb (83.9 kg)   SpO2 97%   BMI 32.77 kg/m   Wt Readings from Last 3 Encounters:  04/19/19 177 lb (80.3 kg)  03/08/18 180 lb (81.6 kg)  02/03/17 175 lb (79.4 kg)    General: Appears her stated age, obese, in NAD. HEENT: Head: normal shape and size; Eyes: sclera white, no icterus, conjunctiva pink, PERRLA and EOMs intact;  Neck: Neck supple, trachea midline. No masses, lumps present.  Cardiovascular: Bradycardic with normal rhythm. S1,S2 noted.  No murmur, rubs or gallops noted. No JVD or BLE edema. No carotid bruits noted. Pulmonary/Chest: Normal effort and positive vesicular breath sounds. No respiratory distress. No wheezes, rales or ronchi noted.  Abdomen: Normal  bowel sounds. Musculoskeletal: No difficulty with gait.  Neurological: Alert and oriented.  Psychiatric: Mood and affect normal. Behavior is normal. Judgment and thought content normal.    BMET    Component Value Date/Time   NA 138 04/19/2019 1820   NA 135 10/18/2014 1311   K 3.5 04/19/2019 1820   K 4.4 10/18/2014 1311   CL 102 04/19/2019 1820   CL 103 10/18/2014 1311   CO2 25 04/19/2019 1820   CO2 25 10/18/2014 1311   GLUCOSE 108 (H) 04/19/2019 1820   GLUCOSE 89 10/18/2014 1311   BUN 22 04/19/2019 1820   BUN 24 (H) 10/18/2014 1311   CREATININE 1.21 (H) 04/19/2019 1820   CREATININE 0.90 10/18/2014 1311   CALCIUM 9.5 04/19/2019 1820   CALCIUM 9.3 10/18/2014 1311   GFRNONAA 46 (L) 04/19/2019 1820   GFRNONAA >60 10/18/2014 1311  GFRAA 53 (L) 04/19/2019 1820   GFRAA >60 10/18/2014 1311    Lipid Panel  No results found for: "CHOL", "TRIG", "HDL", "CHOLHDL", "VLDL", "LDLCALC"  CBC    Component Value Date/Time   WBC 12.6 (H) 04/19/2019 1820   RBC 4.92 04/19/2019 1820   HGB 14.8 04/19/2019 1820   HGB 12.5 10/18/2014 1311   HCT 45.1 04/19/2019 1820   HCT 38.3 10/18/2014 1311   PLT 266 04/19/2019 1820   PLT 238 10/18/2014 1311   MCV 91.7 04/19/2019 1820   MCV 93 10/18/2014 1311   MCH 30.1 04/19/2019 1820   MCHC 32.8 04/19/2019 1820   RDW 12.9 04/19/2019 1820   RDW 12.8 10/18/2014 1311   LYMPHSABS 2.4 04/02/2016 1508   LYMPHSABS 2.0 10/18/2014 1311   MONOABS 0.6 04/02/2016 1508   MONOABS 0.5 10/18/2014 1311   EOSABS 0.3 04/02/2016 1508   EOSABS 0.3 10/18/2014 1311   BASOSABS 0.1 04/02/2016 1508   BASOSABS 0.0 10/18/2014 1311    Hgb A1C No results found for: "HGBA1C"   Assessment and Plan:   RTC in 6 months for your annual exam Webb Silversmith, NP

## 2022-04-21 NOTE — Assessment & Plan Note (Signed)
In remission She is getting yearly mammograms

## 2022-04-21 NOTE — Assessment & Plan Note (Signed)
TSH and free T4 today We will adjust levothyroxine if needed based on labs 

## 2022-04-21 NOTE — Assessment & Plan Note (Signed)
Encourage daily weightbearing exercise Encourage her to add calcium to her vitamin D

## 2022-04-21 NOTE — Assessment & Plan Note (Signed)
In remission status post hysterectomy

## 2022-04-21 NOTE — Assessment & Plan Note (Signed)
Encourage low-carb diet and exercise weight loss A1c today

## 2022-04-21 NOTE — Assessment & Plan Note (Addendum)
Not medicated Follow with cardiology

## 2022-04-21 NOTE — Assessment & Plan Note (Signed)
Encourage diet and exercise for weight loss 

## 2022-04-22 LAB — COMPLETE METABOLIC PANEL WITH GFR
AG Ratio: 1.4 (calc) (ref 1.0–2.5)
ALT: 17 U/L (ref 6–29)
AST: 16 U/L (ref 10–35)
Albumin: 4.2 g/dL (ref 3.6–5.1)
Alkaline phosphatase (APISO): 76 U/L (ref 37–153)
BUN/Creatinine Ratio: 17 (calc) (ref 6–22)
BUN: 17 mg/dL (ref 7–25)
CO2: 25 mmol/L (ref 20–32)
Calcium: 9.6 mg/dL (ref 8.6–10.4)
Chloride: 104 mmol/L (ref 98–110)
Creat: 1.01 mg/dL — ABNORMAL HIGH (ref 0.60–1.00)
Globulin: 3 g/dL (calc) (ref 1.9–3.7)
Glucose, Bld: 87 mg/dL (ref 65–99)
Potassium: 4.5 mmol/L (ref 3.5–5.3)
Sodium: 139 mmol/L (ref 135–146)
Total Bilirubin: 0.4 mg/dL (ref 0.2–1.2)
Total Protein: 7.2 g/dL (ref 6.1–8.1)
eGFR: 60 mL/min/{1.73_m2} (ref >=60)

## 2022-04-22 LAB — T4, FREE: Free T4: 1.2 ng/dL (ref 0.8–1.8)

## 2022-04-22 LAB — LIPID PANEL
Cholesterol: 214 mg/dL — ABNORMAL HIGH (ref <200)
HDL: 54 mg/dL (ref >=50)
LDL Cholesterol (Calc): 127 mg/dL (calc) — ABNORMAL HIGH
Non-HDL Cholesterol (Calc): 160 mg/dL (calc) — ABNORMAL HIGH (ref <130)
Total CHOL/HDL Ratio: 4 (calc) (ref <5.0)
Triglycerides: 192 mg/dL — ABNORMAL HIGH (ref <150)

## 2022-04-22 LAB — CBC
HCT: 42.8 % (ref 35.0–45.0)
Hemoglobin: 14.2 g/dL (ref 11.7–15.5)
MCH: 30.1 pg (ref 27.0–33.0)
MCHC: 33.2 g/dL (ref 32.0–36.0)
MCV: 90.7 fL (ref 80.0–100.0)
MPV: 11.4 fL (ref 7.5–12.5)
Platelets: 240 10*3/uL (ref 140–400)
RBC: 4.72 10*6/uL (ref 3.80–5.10)
RDW: 12 % (ref 11.0–15.0)
WBC: 7.1 10*3/uL (ref 3.8–10.8)

## 2022-04-22 LAB — HEMOGLOBIN A1C
Hgb A1c MFr Bld: 6.3 % of total Hgb — ABNORMAL HIGH (ref <5.7)
Mean Plasma Glucose: 134 mg/dL
eAG (mmol/L): 7.4 mmol/L

## 2022-04-22 LAB — TSH: TSH: 3.48 mIU/L (ref 0.40–4.50)

## 2022-05-15 ENCOUNTER — Ambulatory Visit: Payer: Self-pay

## 2022-05-15 NOTE — Telephone Encounter (Signed)
  Chief Complaint: Cough, sinus congestion Symptoms: HA, Sinus pressure, mouth pain Frequency: weds Pertinent Negatives: Patient denies Fever Disposition: '[]'$ ED /'[x]'$ Urgent Care (no appt availability in office) / '[]'$ Appointment(In office/virtual)/ '[]'$  [facility]/ '[]'$ Home Care/ '[]'$ Refused Recommended Disposition /'[]'$ [facility]/ '[]'$  Follow-up with PCP Additional Notes: Pt called for antibiotic. Pt has a deep wet cough, heard on the call.' Pt is out of town and will return 12/10.     Summary: Sinus Pressure   Patient states that she started having sinus pressure, headache, cough and dental pain yesterday. Patient is requesting an antibiotic.       Reason for Disposition  Lots of coughing  Answer Assessment - Initial Assessment Questions 1. LOCATION: "Where does it hurt?"      Started weds night 2. ONSET: "When did the sinus pain start?"  (e.g., hours, days)      Weds night 3. SEVERITY: "How bad is the pain?"   (Scale 1-10; mild, moderate or severe)   - MILD (1-3): doesn't interfere with normal activities    - MODERATE (4-7): interferes with normal activities (e.g., work or school) or awakens from sleep   - SEVERE (8-10): excruciating pain and patient unable to do any normal activities        Would be severe w/o medicine 4. RECURRENT SYMPTOM: "Have you ever had sinus problems before?" If Yes, ask: "When was the last time?" and "What happened that time?"      Antibiotic 5. NASAL CONGESTION: "Is the nose blocked?" If Yes, ask: "Can you open it or must you breathe through your mouth?"     Can breath through nose 6. NASAL DISCHARGE: "Do you have discharge from your nose?" If so ask, "What color?"     yellow 7. FEVER: "Do you have a fever?" If Yes, ask: "What is it, how was it measured, and when did it start?"      no 8. OTHER SYMPTOMS: "Do you have any other symptoms?" (e.g., sore throat, cough, earache, difficulty breathing)     cough 9. PREGNANCY: "Is there any chance  you are pregnant?" "When was your last menstrual period?"  Protocols used: Sinus Pain or Congestion-A-AH

## 2022-07-07 ENCOUNTER — Ambulatory Visit (INDEPENDENT_AMBULATORY_CARE_PROVIDER_SITE_OTHER): Payer: PPO

## 2022-07-07 DIAGNOSIS — Z0000 Encounter for general adult medical examination without abnormal findings: Secondary | ICD-10-CM

## 2022-07-07 NOTE — Progress Notes (Signed)
I connected with  [name] on 07/07/22 by a audio enabled telemedicine application and verified that I am speaking with the correct person using two identifiers.  Patient Location: Home  Provider Location: Office/Clinic  I discussed the limitations of evaluation and management by telemedicine. The patient expressed understanding and agreed to proceed.  Subjective:   [name] is a 72 y.o. female who presents for Medicare Annual (Subsequent) preventive examination.  Review of Systems    Per HPI unless specifically indicated below.  Cardiac Risk Factors include: advanced age (>50mn, >>63women);female gender, Prediabetes, and Class 1 obesity.         Objective:       04/21/2022   10:05 AM 04/19/2019    7:45 PM 04/19/2019    7:00 PM  Vitals with BMI  Height '5\' 3"'$     Weight 185 lbs    BMI 366.06   Systolic [id number] Diastolic 84 77 72  Pulse 43 84 92    Today's Vitals   07/07/22 0842  PainSc: 0-No pain   There is no height or weight on file to calculate BMI.     07/07/2022    8:57 AM 04/19/2019    6:19 PM 11/11/2015   10:41 AM 10/10/2015    3:05 PM 09/18/2015    1:56 PM 07/12/2015    2:23 PM 04/11/2015    3:23 PM  Advanced Directives  Does Patient Have a Medical Advance Directive? No No No No No No No  Would patient like information on creating a medical advance directive? Yes (MAU/Ambulatory/Procedural Areas - Information given)  No - patient declined information No - patient declined information No - patient declined information  No - patient declined information    Current Medications (verified) Outpatient Encounter Medications as of 07/07/2022  Medication Sig   aspirin 81 MG tablet Take 81 mg by mouth daily.   Cholecalciferol (VITAMIN D) 125 MCG (5000 UT) CAPS Take by mouth.   levothyroxine (SYNTHROID, LEVOTHROID) 75 MCG tablet Take 75 mcg by mouth daily before breakfast.   [DISCONTINUED] metFORMIN (GLUCOPHAGE) 500 MG tablet Take 500 mg by mouth  daily with breakfast.   Facility-Administered Encounter Medications as of 07/07/2022  Medication   fulvestrant (FASLODEX) injection 500 mg    Allergies (verified) Prednisone, Tamoxifen citrate, Anastrozole, Codeine, Dilaudid [hydromorphone hcl], Letrozole, Other, Ibuprofen, Prednisone, Sulfa antibiotics, and Tramadol   History: Past Medical History:  Diagnosis Date   Allergy    [place]    [place]- chemo/ radiation   [place]   left breast, stage 2a, Her2/neu +   COPD (chronic obstructive pulmonary disease) ([place]   seen on chest xray   Hepatitis A 1988   History of chemotherapy    Adriamycin/Cytoxan 12/20/2012-02/27/2013; 12 weekly cycles of Taxol from 03/21/2013-06/06/2013   History of mammogram 01/2014   History of radiation therapy    09/13/2013-11/08/2013   Hypothyroid    Osteopenia    Personal history of chemotherapy    Personal history of radiation therapy    Status post radiation therapy 10/2013   Left breast radiation   [facility] ([facility]    Past Surgical History:  Procedure Laterality Date   ABDOMINAL HYSTERECTOMY  1979   BREAST BIOPSY Left 2014   INVASIVE MAMMARY CARCINOMA   BREAST BIOPSY Left 2018   fat necrosis   BREAST LUMPECTOMY Left 2014   with Radiation   BREAST SURGERY Left 08-16-13  Needle/wire localization of Left breast    CARPAL TUNNEL RELEASE     COLONOSCOPY  09/14/2012   Verdie Shire, MD; Normal exam.    PORTACATH PLACEMENT  2014   SPINAL FUSION     x 3   Family History  Problem Relation Age of Onset   Breast cancer Mother 56   Breast cancer Daughter    Social History   Socioeconomic History   Marital status: Single    Spouse name: Not on file   Number of children: 2   Years of education: Not on file   Highest education level: Not on file  Occupational History   Occupation: Retired  Tobacco Use   Smoking status: Never   Smokeless tobacco: Never  Vaping Use   Vaping Use: Never used  Substance  and Sexual Activity   Alcohol use: No    Alcohol/week: 0.0 standard drinks of alcohol   Drug use: No   Sexual activity: Not on file  Other Topics Concern   Not on file  Social History Narrative   Not on file   Social Determinants of Health   Financial Resource Strain: Low Risk  (07/07/2022)   Overall Financial Resource Strain (CARDIA)    Difficulty of Paying Living Expenses: Not hard at all  Food Insecurity: No Food Insecurity (07/07/2022)   Hunger Vital Sign    Worried About Running Out of Food in the Last Year: Never true    [place] in the Last Year: Never true  Transportation Needs: No Transportation Needs (07/07/2022)   PRAPARE - Hydrologist (Medical): No    Lack of Transportation (Non-Medical): No  Physical Activity: Insufficiently Active (07/07/2022)   Exercise Vital Sign    Days of Exercise per Week: 2 days    Minutes of Exercise per Session: 60 min  Stress: No Stress Concern Present (07/07/2022)   [place]    Feeling of Stress : Not at all  Social Connections: Moderately Integrated (07/07/2022)   Social Connection and Isolation Panel [NHANES]    Frequency of Communication with Friends and Family: More than three times a week    Frequency of Social Gatherings with Friends and Family: Once a week    Attends Religious Services: More than 4 times per year    Active Member of [employer] or Organizations: Yes    Attends Music therapist: More than 4 times per year    Marital Status: Divorced    Tobacco Counseling Counseling given: No   Clinical Intake:  Pre-visit preparation completed: No  Pain : No/denies pain Pain Score: 0-No pain     Nutritional Risks: None Diabetes: Yes CBG done?: No Did pt. bring in CBG monitor from home?: No  How often do you need to have someone help you when you read instructions, pamphlets, or other written materials from your  doctor or pharmacy?: 1 - Never  Diabetic?No  Interpreter Needed?: No  Information entered by :: Donnie Mesa, CMA   Activities of Daily Living    07/07/2022    8:39 AM 04/21/2022   10:41 AM  In your present state of health, do you have any difficulty performing the following activities:  Hearing? 0 0  Vision? [place]   Difficulty concentrating or making decisions? 0 0  Walking or climbing stairs? 0 0  Dressing or bathing? 0 0  Doing errands, shopping? 0 0  Patient Care Team: Jearld Fenton, NP as PCP - General (Internal Medicine) Bary Castilla Forest Gleason, MD (General Surgery) Forest Gleason, MD (Unknown Physician Specialty)  Indicate any recent Medical Services you may have received from other than [facility] providers in the past year (date may be approximate). The pt was seen on 04/14/22 by her Cardiologist Serafina Royals, MD for A-Fib .    Assessment:   This is a routine wellness examination for [place].  Hearing/Vision screen Denies any hearing issues. Denies any changes to her vision. Wear contact. Annual Eye Exam: [facility]  Dietary issues and exercise activities discussed: Current Exercise Habits: Home exercise routine, Type of exercise: walking, Time (Minutes): 60, Frequency (Times/Week): 2, Weekly Exercise (Minutes/Week): 120, Intensity: Mild, Exercise limited by: None identified   Goals Addressed   None    Depression Screen    07/07/2022    8:40 AM 04/21/2022   10:41 AM 11/11/2015   10:42 AM  PHQ 2/9 Scores  PHQ - 2 Score 0 0 0  PHQ- 9 Score 0      Fall Risk    07/07/2022    8:39 AM 04/21/2022   10:42 AM 11/11/2015   10:42 AM  Fall Risk   Falls in the past year? 0 0 No  Number falls in past yr: 0    Injury with Fall? 0 0   Risk for fall due to : No Fall Risks    Follow up Falls evaluation completed      [place]:  Any stairs in or around the home? No  If so, are there any without  handrails? Yes  Home free of loose throw rugs in walkways, pet beds, electrical cords, etc? Yes  Adequate lighting in your home to reduce risk of falls? Yes   ASSISTIVE DEVICES UTILIZED TO PREVENT FALLS:  Life alert? No  Use of a cane, walker or w/c? No  Grab bars in the bathroom? No  Shower chair or bench in shower? No  Elevated toilet seat or a handicapped toilet? No   TIMED UP AND GO:  Was the test performed?  unable to perform, telephone virtual appt .  Cognitive Function:        07/07/2022    8:41 AM  6CIT Screen  What Year? 0 points  What month? 0 points  What time? 0 points  Count back from 20 0 points  Months in reverse 0 points  Repeat phrase 0 points  Total Score 0 points    Immunizations Immunization History  Administered Date(s) Administered   Moderna Sars-Covid-2 Vaccination 11/04/2019, 12/02/2019, 06/20/2020   Pneumococcal Conjugate-13 10/01/2016   Pneumococcal Polysaccharide-23 04/08/2015   Tdap 05/01/2019    TDAP status: Up to date  Flu Vaccine status: Due, Education has been provided regarding the importance of this vaccine. Advised may receive this vaccine at local pharmacy or Health Dept. Aware to provide a copy of the vaccination record if obtained from local pharmacy or Health Dept. Verbalized acceptance and understanding.  Pneumococcal vaccine status: Due, Education has been provided regarding the importance of this vaccine. Advised may receive this vaccine at local pharmacy or Health Dept. Aware to provide a copy of the vaccination record if obtained from local pharmacy or Health Dept. Verbalized acceptance and understanding.  Covid-19 vaccine status: Information provided on how to obtain vaccines.   Qualifies for Shingles Vaccine? Yes   Zostavax completed No   Shingrix Completed?: No.    Education has been  provided regarding the importance of this vaccine. Patient has been advised to call insurance company to determine out of pocket expense if  they have not yet received this vaccine. Advised may also receive vaccine at local pharmacy or Health Dept. Verbalized acceptance and understanding.  Screening Tests Health Maintenance  Topic Date Due   Hepatitis C Screening  Never done   Pneumonia Vaccine 19+ Years old (3 - PPSV23 or PCV20) 10/01/2021   COVID-19 Vaccine (4 - 2023-24 season) 02/20/2022   INFLUENZA VACCINE  09/20/2022 (Originally 01/20/2022)   Zoster Vaccines- Shingrix (1 of 2) 10/06/2022 (Originally 09/02/1969)   COLONOSCOPY (Pts 45-56yr Insurance coverage will need to be confirmed)  09/15/2022   Medicare Annual Wellness (AWV)  07/08/2023   MAMMOGRAM  03/02/2024   DTaP/Tdap/Td (2 - Td or Tdap) 04/30/2029   DEXA SCAN  Completed   HPV VACCINES  Aged Out    Health Maintenance  Health Maintenance Due  Topic Date Due   Hepatitis C Screening  Never done   Pneumonia Vaccine 72 Years old (336- PPSV23 or PCV20) 10/01/2021   COVID-19 Vaccine (4 - 2023-24 season) 02/20/2022    Colorectal cancer screening: Type of screening: Colonoscopy. Completed 09/14/2012. Repeat every 10 years  Mammogram status: Completed 03/02/2022. Repeat every year  DEXA Scan: 11/19/2016  Lung Cancer Screening: (Low Dose CT Chest recommended if Age 72-80years, 30 pack-year currently smoking OR have quit w/in 15years.) does not qualify.   Lung Cancer Screening Referral: not applicable   Additional Screening:  Hepatitis C Screening: does qualify;Overdue   Vision Screening: Recommended annual ophthalmology exams for early detection of glaucoma and other disorders of the eye. Is the patient up to date with their annual eye exam?  Yes  Who is the provider or what is the name of the office in which the patient attends annual eye exams? [facility] If pt is not established with a provider, would they like to be referred to a provider to establish care? No .   Dental Screening: Recommended annual dental exams for proper oral hygiene  Community  Resource Referral / Chronic Care Management: CRR required this visit?  No   CCM required this visit?  No      Plan:     I have personally reviewed and noted the following in the patient's chart:   Medical and social history Use of alcohol, tobacco or illicit drugs  Current medications and supplements including opioid prescriptions. Patient is not currently taking opioid prescriptions. Functional ability and status Nutritional status Physical activity Advanced directives List of other physicians Hospitalizations, surgeries, and ER visits in previous 12 months Vitals Screenings to include cognitive, depression, and falls Referrals and appointments  In addition, I have reviewed and discussed with patient certain preventive protocols, quality metrics, and best practice recommendations. A written personalized care plan for preventive services as well as general preventive health recommendations were provided to patient.    Ms. BRoeper, Thank you for taking time to come for your Medicare Wellness Visit. I appreciate your ongoing commitment to your health goals. Please review the following plan we discussed and let me know if I can assist you in the future.   These are the goals we discussed:  Goals   None     This is a list of the screening recommended for you and due dates:  Health Maintenance  Topic Date Due   Hepatitis C Screening: USPSTF Recommendation to screen - Ages 147-79yo.  Never  done   Pneumonia Vaccine (3 - PPSV23 or PCV20) 10/01/2021   COVID-19 Vaccine (4 - 2023-24 season) 02/20/2022   Flu Shot  09/20/2022*   Zoster (Shingles) Vaccine (1 of 2) 10/06/2022*   Colon Cancer Screening  09/15/2022   Medicare Annual Wellness Visit  07/08/2023   Mammogram  03/02/2024   DTaP/Tdap/Td vaccine (2 - Td or Tdap) 04/30/2029   DEXA scan (bone density measurement)  Completed   HPV Vaccine  Aged Out  *Topic was postponed. The date shown is not the original due date.      Wilson Singer, [place]   07/07/2022   Nurse Notes: Approximately 30 minute Non-Face -To-Face Medicare Wellness Visit

## 2022-07-07 NOTE — Patient Instructions (Signed)

## 2022-10-01 ENCOUNTER — Encounter: Payer: PPO | Admitting: Internal Medicine

## 2022-10-01 ENCOUNTER — Ambulatory Visit (INDEPENDENT_AMBULATORY_CARE_PROVIDER_SITE_OTHER): Payer: PPO | Admitting: Internal Medicine

## 2022-10-01 ENCOUNTER — Other Ambulatory Visit: Payer: Self-pay | Admitting: Internal Medicine

## 2022-10-01 ENCOUNTER — Encounter: Payer: Self-pay | Admitting: Internal Medicine

## 2022-10-01 VITALS — BP 132/66 | HR 52 | Temp 96.6°F | Wt 176.0 lb

## 2022-10-01 DIAGNOSIS — Z1231 Encounter for screening mammogram for malignant neoplasm of breast: Secondary | ICD-10-CM | POA: Diagnosis not present

## 2022-10-01 DIAGNOSIS — R7303 Prediabetes: Secondary | ICD-10-CM | POA: Diagnosis not present

## 2022-10-01 DIAGNOSIS — Z0001 Encounter for general adult medical examination with abnormal findings: Secondary | ICD-10-CM

## 2022-10-01 DIAGNOSIS — Z1211 Encounter for screening for malignant neoplasm of colon: Secondary | ICD-10-CM | POA: Diagnosis not present

## 2022-10-01 DIAGNOSIS — E039 Hypothyroidism, unspecified: Secondary | ICD-10-CM

## 2022-10-01 DIAGNOSIS — E6609 Other obesity due to excess calories: Secondary | ICD-10-CM | POA: Diagnosis not present

## 2022-10-01 DIAGNOSIS — Z6831 Body mass index (BMI) 31.0-31.9, adult: Secondary | ICD-10-CM

## 2022-10-01 DIAGNOSIS — Z780 Asymptomatic menopausal state: Secondary | ICD-10-CM | POA: Diagnosis not present

## 2022-10-01 DIAGNOSIS — Z1159 Encounter for screening for other viral diseases: Secondary | ICD-10-CM | POA: Diagnosis not present

## 2022-10-01 MED ORDER — PHENTERMINE HCL 37.5 MG PO TABS: 37.5000 mg | ORAL_TABLET | Freq: Every day | ORAL | 1 refills | Status: DC

## 2022-10-01 NOTE — Assessment & Plan Note (Signed)
Encourage diet and exercise for weight loss We will give her a limited prescription for phentermine 37.5 mg

## 2022-10-01 NOTE — Patient Instructions (Signed)
Health Maintenance for Postmenopausal Women Menopause is a normal process in which your ability to get pregnant comes to an end. This process happens slowly over many months or years, usually between the ages of 48 and 55. Menopause is complete when you have missed your menstrual period for 12 months. It is important to talk with your health care provider about some of the most common conditions that affect women after menopause (postmenopausal women). These include heart disease, cancer, and bone loss (osteoporosis). Adopting a healthy lifestyle and getting preventive care can help to promote your health and wellness. The actions you take can also lower your chances of developing some of these common conditions. What are the signs and symptoms of menopause? During menopause, you may have the following symptoms: Hot flashes. These can be moderate or severe. Night sweats. Decrease in sex drive. Mood swings. Headaches. Tiredness (fatigue). Irritability. Memory problems. Problems falling asleep or staying asleep. Talk with your health care provider about treatment options for your symptoms. Do I need hormone replacement therapy? Hormone replacement therapy is effective in treating symptoms that are caused by menopause, such as hot flashes and night sweats. Hormone replacement carries certain risks, especially as you become older. If you are thinking about using estrogen or estrogen with progestin, discuss the benefits and risks with your health care provider. How can I reduce my risk for heart disease and stroke? The risk of heart disease, heart attack, and stroke increases as you age. One of the causes may be a change in the body's hormones during menopause. This can affect how your body uses dietary fats, triglycerides, and cholesterol. Heart attack and stroke are medical emergencies. There are many things that you can do to help prevent heart disease and stroke. Watch your blood pressure High  blood pressure causes heart disease and increases the risk of stroke. This is more likely to develop in people who have high blood pressure readings or are overweight. Have your blood pressure checked: Every 3-5 years if you are 18-39 years of age. Every year if you are 40 years old or older. Eat a healthy diet  Eat a diet that includes plenty of vegetables, fruits, low-fat dairy products, and lean protein. Do not eat a lot of foods that are high in solid fats, added sugars, or sodium. Get regular exercise Get regular exercise. This is one of the most important things you can do for your health. Most adults should: Try to exercise for at least 150 minutes each week. The exercise should increase your heart rate and make you sweat (moderate-intensity exercise). Try to do strengthening exercises at least twice each week. Do these in addition to the moderate-intensity exercise. Spend less time sitting. Even light physical activity can be beneficial. Other tips Work with your health care provider to achieve or maintain a healthy weight. Do not use any products that contain nicotine or tobacco. These products include cigarettes, chewing tobacco, and vaping devices, such as e-cigarettes. If you need help quitting, ask your health care provider. Know your numbers. Ask your health care provider to check your cholesterol and your blood sugar (glucose). Continue to have your blood tested as directed by your health care provider. Do I need screening for cancer? Depending on your health history and family history, you may need to have cancer screenings at different stages of your life. This may include screening for: Breast cancer. Cervical cancer. Lung cancer. Colorectal cancer. What is my risk for osteoporosis? After menopause, you may be   at increased risk for osteoporosis. Osteoporosis is a condition in which bone destruction happens more quickly than new bone creation. To help prevent osteoporosis or  the bone fractures that can happen because of osteoporosis, you may take the following actions: If you are 19-50 years old, get at least 1,000 mg of calcium and at least 600 international units (IU) of vitamin D per day. If you are older than age 50 but younger than age 70, get at least 1,200 mg of calcium and at least 600 international units (IU) of vitamin D per day. If you are older than age 70, get at least 1,200 mg of calcium and at least 800 international units (IU) of vitamin D per day. Smoking and drinking excessive alcohol increase the risk of osteoporosis. Eat foods that are rich in calcium and vitamin D, and do weight-bearing exercises several times each week as directed by your health care provider. How does menopause affect my mental health? Depression may occur at any age, but it is more common as you become older. Common symptoms of depression include: Feeling depressed. Changes in sleep patterns. Changes in appetite or eating patterns. Feeling an overall lack of motivation or enjoyment of activities that you previously enjoyed. Frequent crying spells. Talk with your health care provider if you think that you are experiencing any of these symptoms. General instructions See your health care provider for regular wellness exams and vaccines. This may include: Scheduling regular health, dental, and eye exams. Getting and maintaining your vaccines. These include: Influenza vaccine. Get this vaccine each year before the flu season begins. Pneumonia vaccine. Shingles vaccine. Tetanus, diphtheria, and pertussis (Tdap) booster vaccine. Your health care provider may also recommend other immunizations. Tell your health care provider if you have ever been abused or do not feel safe at home. Summary Menopause is a normal process in which your ability to get pregnant comes to an end. This condition causes hot flashes, night sweats, decreased interest in sex, mood swings, headaches, or lack  of sleep. Treatment for this condition may include hormone replacement therapy. Take actions to keep yourself healthy, including exercising regularly, eating a healthy diet, watching your weight, and checking your blood pressure and blood sugar levels. Get screened for cancer and depression. Make sure that you are up to date with all your vaccines. This information is not intended to replace advice given to you by your health care provider. Make sure you discuss any questions you have with your health care provider. Document Revised: 10/28/2020 Document Reviewed: 10/28/2020 Elsevier Patient Education  2023 Elsevier Inc.  

## 2022-10-01 NOTE — Progress Notes (Signed)
Subjective:    Patient ID: [name], female    DOB: 17-Oct-1950, 72 y.o.   MRN: [id number]  HPI  Patient presents to clinic today for her annual exam.  Flu: never Tetanus: 04/2019 COVID: Moderna x 3 Pneumovax: 03/2015 Prevnar: 09/2016 Shingrix: never Pap smear: Hysterectomy Mammogram: 02/2022 Bone density: 10/2016 Colon screening: 08/2012 Vision screening: annually Dentist: biannually  Diet: She does eat meat. She consumes some fruits, more veggies. She tries to avoid fried foods. She drinks mostly water. 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, stage 2a, Her2/neu +   COPD (chronic obstructive pulmonary disease) (HCC) 2014   seen on chest xray   Hepatitis A 1988   History of chemotherapy    Adriamycin/Cytoxan 12/20/2012-02/27/2013; 12 weekly cycles of Taxol from 03/21/2013-06/06/2013   History of mammogram 01/2014   History of radiation therapy    09/13/2013-11/08/2013   Hypothyroid    Osteopenia    Personal history of chemotherapy    Personal history of radiation therapy    Status post radiation therapy 10/2013   Left breast radiation   Uterine cancer (HCC)     Current Outpatient Medications  Medication Sig Dispense Refill   aspirin 81 MG tablet Take 81 mg by mouth daily.     Cholecalciferol (VITAMIN D) 125 MCG (5000 UT) CAPS Take by mouth.     levothyroxine (SYNTHROID, LEVOTHROID) 75 MCG tablet Take 75 mcg by mouth daily before breakfast.     No current facility-administered medications for this visit.   Facility-Administered Medications Ordered in Other Visits  Medication Dose Route Frequency Provider Last Rate Last Admin   fulvestrant (FASLODEX) injection 500 mg  500 mg Intramuscular Q30 days Rosey Bath, MD   500 mg at 11/16/14 1558    Allergies  Allergen Reactions    Prednisone Rash   Tamoxifen Citrate Other (See Comments)    Constipation and irritability   Anastrozole Other (See Comments)    constipation , irritability   Codeine Nausea And Vomiting    sick   Dilaudid [Hydromorphone Hcl]    Letrozole Hives   Other Other (See Comments)    congestion   Ibuprofen Rash   Prednisone Rash   Sulfa Antibiotics Rash and Itching   Tramadol Nausea And Vomiting, Itching and Rash    Family History  Problem Relation Age of Onset   Breast cancer Mother 66   Breast cancer Daughter     Social History   Socioeconomic History   Marital status: Single    Spouse name: Not on file   Number of children: 2   Years of education: Not on file   Highest education level: Not on file  Occupational History   Occupation: Retired  Tobacco Use   Smoking status: Never   Smokeless tobacco: Never  Vaping Use   Vaping Use: Never used  Substance and Sexual Activity   Alcohol use: No    Alcohol/week: 0.0 standard drinks of alcohol   Drug use: No   Sexual activity: Not on file  Other Topics Concern   Not on file  Social History Narrative   Not on file   Social Determinants of Health   Financial Resource Strain: Low Risk  (07/07/2022)   Overall Financial Resource Strain (CARDIA)  Difficulty of Paying Living Expenses: Not hard at all  Food Insecurity: No Food Insecurity (07/07/2022)   Hunger Vital Sign    Worried About Running Out of Food in the Last Year: Never true    Ran Out of Food in the Last Year: Never true  Transportation Needs: No Transportation Needs (07/07/2022)   PRAPARE - Administrator, Civil ServiceTransportation    Lack of Transportation (Medical): No    Lack of Transportation (Non-Medical): No  Physical Activity: Insufficiently Active (07/07/2022)   Exercise Vital Sign    Days of Exercise per Week: 2 days    Minutes of Exercise per Session: 60 min  Stress: No Stress Concern Present (07/07/2022)   [employer] - Occupational Stress  Questionnaire    Feeling of Stress : Not at all  Social Connections: Moderately Integrated (07/07/2022)   Social Connection and Isolation Panel [NHANES]    Frequency of Communication with Friends and Family: More than three times a week    Frequency of Social Gatherings with Friends and Family: Once a week    Attends Religious Services: More than 4 times per year    Active Member of [employer]ubs or Organizations: Yes    Attends Engineer, structuralClub or Organization Meetings: More than 4 times per year    Marital Status: Divorced  Intimate Partner Violence: Not At Risk (07/07/2022)   Humiliation, Afraid, Rape, and Kick questionnaire    Fear of Current or Ex-Partner: No    Emotionally Abused: No    Physically Abused: No    Sexually Abused: No     Constitutional: Denies fever, malaise, fatigue, headache or abrupt weight changes.  HEENT: Denies eye pain, eye redness, ear pain, ringing in the ears, wax buildup, runny nose, nasal congestion, bloody nose, or sore throat. Respiratory: Denies difficulty breathing, shortness of breath, cough or sputum production.   Cardiovascular: Denies chest pain, chest tightness, palpitations or swelling in the hands or feet.  Gastrointestinal: Patient reports constipation.  Denies abdominal pain, bloating, diarrhea or blood in the stool.  GU: Denies urgency, frequency, pain with urination, burning sensation, blood in urine, odor or discharge. Musculoskeletal: Denies decrease in range of motion, difficulty with gait, muscle pain or joint pain and swelling.  Skin: Denies redness, rashes, lesions or ulcercations.  Neurological: Denies dizziness, difficulty with memory, difficulty with speech or problems with balance and coordination.  Psych: Denies anxiety, depression, SI/HI.  No other specific complaints in a complete review of systems (except as listed in HPI above).  Objective:   Physical Exam  BP 132/66 (BP Location: Right Arm, Patient Position: Sitting, Cuff Size: Normal)    Pulse (!) 52   Temp (!) 96.6 F (35.9 C) (Temporal)   Wt 176 lb (79.8 kg)   SpO2 96%   BMI 31.18 kg/m   Wt Readings from Last 3 Encounters:  04/21/22 185 lb (83.9 kg)  04/19/19 177 lb (80.3 kg)  03/08/18 180 lb (81.6 kg)    General: Appears her stated age, obese, in NAD. Skin: Warm, dry and intact.  Sun damaged skin noted. HEENT: Head: normal shape and size; Eyes: sclera white, no icterus, conjunctiva pink, PERRLA and EOMs intact;  Neck:  Neck supple, trachea midline. No masses, lumps or thyromegaly present.  Cardiovascular: Normal rate and rhythm. S1,S2 noted.  No murmur, rubs or gallops noted. No JVD or BLE edema.  Varicose veins noted bilaterally.  No carotid bruits noted. Pulmonary/Chest: Normal effort and positive vesicular breath sounds. No respiratory distress. No wheezes, rales or ronchi  noted.  Abdomen: Soft and nontender. Normal bowel sounds.  Musculoskeletal: Strength 5/5 BUE/BLE.  No difficulty with gait.  Neurological: Alert and oriented. Cranial nerves II-XII grossly intact. Coordination normal.  Psychiatric: Mood and affect normal. Behavior is normal. Judgment and thought content normal.    BMET    Component Value Date/Time   NA 139 04/21/2022 1039   NA 135 10/18/2014 1311   K 4.5 04/21/2022 1039   K 4.4 10/18/2014 1311   CL 104 04/21/2022 1039   CL 103 10/18/2014 1311   CO2 25 04/21/2022 1039   CO2 25 10/18/2014 1311   GLUCOSE 87 04/21/2022 1039   GLUCOSE 89 10/18/2014 1311   BUN 17 04/21/2022 1039   BUN 24 (H) 10/18/2014 1311   CREATININE 1.01 (H) 04/21/2022 1039   CALCIUM 9.6 04/21/2022 1039   CALCIUM 9.3 10/18/2014 1311   GFRNONAA 46 (L) 04/19/2019 1820   GFRNONAA >60 10/18/2014 1311   GFRAA 53 (L) 04/19/2019 1820   GFRAA >60 10/18/2014 1311    Lipid Panel     Component Value Date/Time   CHOL 214 (H) 04/21/2022 1039   TRIG 192 (H) 04/21/2022 1039   HDL 54 04/21/2022 1039   CHOLHDL 4.0 04/21/2022 1039   LDLCALC 127 (H) 04/21/2022 1039     CBC    Component Value Date/Time   WBC 7.1 04/21/2022 1039   RBC 4.72 04/21/2022 1039   HGB 14.2 04/21/2022 1039   HGB 12.5 10/18/2014 1311   HCT 42.8 04/21/2022 1039   HCT 38.3 10/18/2014 1311   PLT 240 04/21/2022 1039   PLT 238 10/18/2014 1311   MCV 90.7 04/21/2022 1039   MCV 93 10/18/2014 1311   MCH 30.1 04/21/2022 1039   MCHC 33.2 04/21/2022 1039   RDW 12.0 04/21/2022 1039   RDW 12.8 10/18/2014 1311   LYMPHSABS 2.4 04/02/2016 1508   LYMPHSABS 2.0 10/18/2014 1311   MONOABS 0.6 04/02/2016 1508   MONOABS 0.5 10/18/2014 1311   EOSABS 0.3 04/02/2016 1508   EOSABS 0.3 10/18/2014 1311   BASOSABS 0.1 04/02/2016 1508   BASOSABS 0.0 10/18/2014 1311    Hgb A1C Lab Results  Component Value Date   HGBA1C 6.3 (H) 04/21/2022           Assessment & Plan:   Preventative Health Maintenance:  Encouraged her to get a flu shot in the fall Tetanus UTD Encouraged her to get her COVID-booster Pneumovax and Prevnar 13 UTD She declines Prevnar 20 today Discussed Shingrix vaccine, she will check coverage with her insurance company and schedule a visit if she would like to have this done She no longer needs Pap smears Mammogram and bone density ordered-she will call to schedule Referral to GI for screening colonoscopy Encouraged her to consume a balanced diet and exercise regimen Advised her to see an eye doctor and dentist annually We will check CBC, c-Met, TSH, free T4, lipid, A1c and hep C today  RTC in 6 months, follow-up chronic conditions Nicki Reaper, NP

## 2022-10-01 NOTE — Telephone Encounter (Signed)
Copied from CRM [id number]. Topic: General - Other >> Oct 01, 2022  4:38 PM Everette C wrote: Reason for CRM: Medication Refill - Medication: levothyroxine (SYNTHROID, LEVOTHROID) 75 MCG tablet [[id number]]  Has the patient contacted their pharmacy? Yes.   (Agent: If no, request that the patient contact the pharmacy for the refill. If patient does not wish to contact the pharmacy document the reason why and proceed with request.) (Agent: If yes, when and what did the pharmacy advise?)  Preferred Pharmacy (with phone number or street name): [phone] - [address] [place] - [address] [place] [address] Phone: [phone] Fax: [phone] Hours: Not open 24 hours   Has the patient been seen for an appointment in the last year OR does the patient have an upcoming appointment? Yes.    Agent: Please be advised that RX refills may take up to 3 business days. We ask that you follow-up with your pharmacy.

## 2022-10-02 LAB — LIPID PANEL
Cholesterol: 159 mg/dL (ref <200)
HDL: 57 mg/dL (ref >=50)
LDL Cholesterol (Calc): 86 mg/dL (calc)
Non-HDL Cholesterol (Calc): 102 mg/dL (calc) (ref <130)
Total CHOL/HDL Ratio: 2.8 (calc) (ref <5.0)
Triglycerides: 74 mg/dL (ref <150)

## 2022-10-02 LAB — CBC
HCT: 43.3 % (ref 35.0–45.0)
Hemoglobin: 14 g/dL (ref 11.7–15.5)
MCH: 29.5 pg (ref 27.0–33.0)
MCHC: 32.3 g/dL (ref 32.0–36.0)
MCV: 91.4 fL (ref 80.0–100.0)
MPV: 11.2 fL (ref 7.5–12.5)
Platelets: 251 10*3/uL (ref 140–400)
RBC: 4.74 10*6/uL (ref 3.80–5.10)
RDW: 12.2 % (ref 11.0–15.0)
WBC: 6.6 10*3/uL (ref 3.8–10.8)

## 2022-10-02 LAB — HEMOGLOBIN A1C
Hgb A1c MFr Bld: 6 % of total Hgb — ABNORMAL HIGH (ref <5.7)
Mean Plasma Glucose: 126 mg/dL
eAG (mmol/L): 7 mmol/L

## 2022-10-02 LAB — COMPLETE METABOLIC PANEL WITH GFR
AG Ratio: 1.5 (calc) (ref 1.0–2.5)
ALT: 19 U/L (ref 6–29)
AST: 17 U/L (ref 10–35)
Albumin: 4.3 g/dL (ref 3.6–5.1)
Alkaline phosphatase (APISO): 68 U/L (ref 37–153)
BUN: 15 mg/dL (ref 7–25)
CO2: 27 mmol/L (ref 20–32)
Calcium: 10 mg/dL (ref 8.6–10.4)
Chloride: 105 mmol/L (ref 98–110)
Creat: 0.91 mg/dL (ref 0.60–1.00)
Globulin: 2.8 g/dL (calc) (ref 1.9–3.7)
Glucose, Bld: 98 mg/dL (ref 65–139)
Potassium: 4.7 mmol/L (ref 3.5–5.3)
Sodium: 140 mmol/L (ref 135–146)
Total Bilirubin: 0.4 mg/dL (ref 0.2–1.2)
Total Protein: 7.1 g/dL (ref 6.1–8.1)
eGFR: 67 mL/min/{1.73_m2} (ref >=60)

## 2022-10-02 LAB — TSH: TSH: 3.35 mIU/L (ref 0.40–4.50)

## 2022-10-02 LAB — T4, FREE: Free T4: 1.4 ng/dL (ref 0.8–1.8)

## 2022-10-02 LAB — HEPATITIS C ANTIBODY: Hepatitis C Ab: NONREACTIVE

## 2022-10-02 MED ORDER — LEVOTHYROXINE SODIUM 75 MCG PO TABS: 75.0000 ug | ORAL_TABLET | Freq: Every day | ORAL | 1 refills | Status: DC

## 2022-10-02 NOTE — Telephone Encounter (Signed)
Requested medication (s) are due for refill today: yes  Requested medication (s) are on the active medication list: yes  Last refill:  08/31/16  Future visit scheduled: yes  Notes to clinic:  Unable to refill per protocol, last refill by another provider.  Historical provider.     Requested Prescriptions  Pending Prescriptions Disp Refills   levothyroxine (SYNTHROID) 75 MCG tablet      Sig: Take 1 tablet (75 mcg total) by mouth daily before breakfast.     Endocrinology:  Hypothyroid Agents Passed - 10/01/2022  6:00 PM      Passed - TSH in normal range and within 360 days    TSH  Date Value Ref Range Status  10/01/2022 3.35 0.40 - 4.50 mIU/L Final         Passed - Valid encounter within last 12 months    Recent Outpatient Visits           Yesterday Encounter for general adult medical examination with abnormal findings   [facility] [place], Salvadore Oxford, NP   5 months ago Acquired hypothyroidism   [facility] [place], Salvadore Oxford, NP       Future Appointments             In 6 months Baity, Salvadore Oxford, NP [facility], [facility]

## 2022-12-21 DIAGNOSIS — N3000 Acute cystitis without hematuria: Secondary | ICD-10-CM | POA: Diagnosis not present

## 2023-02-13 DIAGNOSIS — N3000 Acute cystitis without hematuria: Secondary | ICD-10-CM | POA: Diagnosis not present

## 2023-03-04 ENCOUNTER — Ambulatory Visit
Admission: RE | Admit: 2023-03-04 | Discharge: 2023-03-04 | Disposition: A | Discharge status: Home / Self Care | Payer: PPO | Source: Ambulatory Visit | Attending: Internal Medicine | Admitting: Internal Medicine

## 2023-03-04 DIAGNOSIS — M810 Age-related osteoporosis without current pathological fracture: Secondary | ICD-10-CM | POA: Diagnosis not present

## 2023-03-04 DIAGNOSIS — Z1231 Encounter for screening mammogram for malignant neoplasm of breast: Secondary | ICD-10-CM | POA: Diagnosis not present

## 2023-03-04 DIAGNOSIS — Z780 Asymptomatic menopausal state: Secondary | ICD-10-CM | POA: Diagnosis not present

## 2023-03-26 ENCOUNTER — Other Ambulatory Visit: Payer: Self-pay | Admitting: Internal Medicine

## 2023-03-26 DIAGNOSIS — Z1212 Encounter for screening for malignant neoplasm of rectum: Secondary | ICD-10-CM

## 2023-03-26 DIAGNOSIS — Z1211 Encounter for screening for malignant neoplasm of colon: Secondary | ICD-10-CM

## 2023-04-02 ENCOUNTER — Encounter: Payer: Self-pay | Admitting: Internal Medicine

## 2023-04-02 ENCOUNTER — Ambulatory Visit (INDEPENDENT_AMBULATORY_CARE_PROVIDER_SITE_OTHER): Payer: PPO | Admitting: Internal Medicine

## 2023-04-02 ENCOUNTER — Ambulatory Visit: Payer: PPO | Admitting: Internal Medicine

## 2023-04-02 VITALS — BP 112/60 | HR 50 | Temp 95.9°F | Wt 159.0 lb

## 2023-04-02 DIAGNOSIS — I480 Paroxysmal atrial fibrillation: Secondary | ICD-10-CM

## 2023-04-02 DIAGNOSIS — Z136 Encounter for screening for cardiovascular disorders: Secondary | ICD-10-CM | POA: Diagnosis not present

## 2023-04-02 DIAGNOSIS — Z6828 Body mass index (BMI) 28.0-28.9, adult: Secondary | ICD-10-CM

## 2023-04-02 DIAGNOSIS — E039 Hypothyroidism, unspecified: Secondary | ICD-10-CM

## 2023-04-02 DIAGNOSIS — K5909 Other constipation: Secondary | ICD-10-CM | POA: Diagnosis not present

## 2023-04-02 DIAGNOSIS — Z853 Personal history of malignant neoplasm of breast: Secondary | ICD-10-CM | POA: Diagnosis not present

## 2023-04-02 DIAGNOSIS — E663 Overweight: Secondary | ICD-10-CM

## 2023-04-02 DIAGNOSIS — Z8542 Personal history of malignant neoplasm of other parts of uterus: Secondary | ICD-10-CM | POA: Diagnosis not present

## 2023-04-02 DIAGNOSIS — M810 Age-related osteoporosis without current pathological fracture: Secondary | ICD-10-CM | POA: Diagnosis not present

## 2023-04-02 DIAGNOSIS — R7303 Prediabetes: Secondary | ICD-10-CM | POA: Diagnosis not present

## 2023-04-02 NOTE — Assessment & Plan Note (Signed)
Rate controlled off beta-blocker antiarrhythmic Advised her to continue baby aspirin daily She does not follow with cardiology

## 2023-04-02 NOTE — Assessment & Plan Note (Signed)
Encouraged diet and exercise for weight loss ?

## 2023-04-02 NOTE — Assessment & Plan Note (Signed)
Encouraged high-fiber diet and adequate water intake Continue mag citrate as needed

## 2023-04-02 NOTE — Assessment & Plan Note (Signed)
In remission Getting yearly mammograms

## 2023-04-02 NOTE — Assessment & Plan Note (Signed)
A1c today Encourage low-carb diet and exercise for weight loss

## 2023-04-02 NOTE — Assessment & Plan Note (Signed)
Continue vitamin D OTC Encouraged daily weightbearing exercise

## 2023-04-02 NOTE — Progress Notes (Signed)
Subjective:    Patient ID: [name], female    DOB: 24-Jun-1950, 72 y.o.   MRN: [id number]  HPI  Patient presents to clinic today for 100-month follow-up of chronic conditions.  Hypothyroidism: She denies any issues on her current dose of levothyroxine.  She does not follow with endocrinology.  History of left breast cancer: In remission status post lumpectomy, chemo and radiation.  She no longer follows with oncology.  History of uterine cancer: In remission status post hysterectomy.  She no longer follows with oncology.  Chronic constipation: Managed with mag citrate as needed but she reports she has needed this less often lately.  Colonoscopy from 08/2012 reviewed.  Osteoporosis: She takes vitamin D OTC but does not take calcium.  She does not get any weightbearing exercise.  Bone density from 02/2023 reviewed.  Prediabetes: Her last A1c was 6%, 09/2022.  She is not taking any oral diabetic medication at this time.  She does not check her sugars.  Paroxysmal A-fib: She is not currently taking any medications for this other than aspirin.  She does follow with cardiology.  ECG from reviewed.  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, stage 2a, Her2/neu +   COPD (chronic obstructive pulmonary disease) (HCC) 2014   seen on chest xray   Hepatitis A 1988   History of chemotherapy    Adriamycin/Cytoxan 12/20/2012-02/27/2013; 12 weekly cycles of Taxol from 03/21/2013-06/06/2013   History of mammogram 01/2014   History of radiation therapy    09/13/2013-11/08/2013   Hypothyroid    Osteopenia    Personal history of chemotherapy    Personal history of radiation therapy    Status post radiation therapy 10/2013   Left breast radiation   Uterine cancer (HCC)     Current Outpatient Medications  Medication Sig Dispense Refill   aspirin 81 MG tablet Take 81  mg by mouth daily.     Cholecalciferol (VITAMIN D) 125 MCG (5000 UT) CAPS Take by mouth.     levothyroxine (SYNTHROID) 75 MCG tablet Take 1 tablet (75 mcg total) by mouth daily before breakfast. 90 tablet 1   No current facility-administered medications for this visit.   Facility-Administered Medications Ordered in Other Visits  Medication Dose Route Frequency Provider Last Rate Last Admin   fulvestrant (FASLODEX) injection 500 mg  500 mg Intramuscular Q30 days Rosey Bath, MD   500 mg at 11/16/14 1558    Allergies  Allergen Reactions   Prednisone Rash   Tamoxifen Citrate Other (See Comments)    Constipation and irritability   Anastrozole Other (See Comments)    constipation , irritability   Codeine Nausea And Vomiting    sick   Dilaudid [Hydromorphone Hcl]    Letrozole Hives   Other Other (See Comments)    congestion   Ibuprofen Rash   Prednisone Rash   Sulfa Antibiotics Rash and Itching   Tramadol Nausea And Vomiting, Itching and Rash    Family History  Problem Relation Age of Onset   Breast cancer Mother 76   Breast cancer Daughter     Social History   Socioeconomic History   Marital status: Single    Spouse name: Not on file   Number of children: 2   Years of education: Not on file   Highest education level:  GED or equivalent  Occupational History   Occupation: Retired  Tobacco Use   Smoking status: Never   Smokeless tobacco: Never  Vaping Use   Vaping status: Never Used  Substance and Sexual Activity   Alcohol use: No    Alcohol/week: 0.0 standard drinks of alcohol   Drug use: No   Sexual activity: Not on file  Other Topics Concern   Not on file  Social History Narrative   Not on file   Social Determinants of Health   Financial Resource Strain: Low Risk  (03/29/2023)   Overall Financial Resource Strain (CARDIA)    Difficulty of Paying Living Expenses: Not hard at all  Food Insecurity: No Food Insecurity (03/29/2023)   Hunger Vital Sign     Worried About Running Out of Food in the Last Year: Never true    Ran Out of Food in the Last Year: Never true  Transportation Needs: No Transportation Needs (03/29/2023)   PRAPARE - Administrator, Civil Service (Medical): No    Lack of Transportation (Non-Medical): No  Physical Activity: Sufficiently Active (03/29/2023)   Exercise Vital Sign    Days of Exercise per Week: 7 days    Minutes of Exercise per Session: 30 min  Stress: No Stress Concern Present (03/29/2023)   [employer] of Occupational Health - Occupational Stress Questionnaire    Feeling of Stress : Not at all  Social Connections: Moderately Integrated (03/29/2023)   Social Connection and Isolation Panel [NHANES]    Frequency of Communication with Friends and Family: More than three times a week    Frequency of Social Gatherings with Friends and Family: Three times a week    Attends Religious Services: More than 4 times per year    Active Member of Clubs or Organizations: No    Attends Banker Meetings: More than 4 times per year    Marital Status: Divorced  Intimate Partner Violence: Not At Risk (07/07/2022)   Humiliation, Afraid, Rape, and Kick questionnaire    Fear of Current or Ex-Partner: No    Emotionally Abused: No    Physically Abused: No    Sexually Abused: No     Constitutional: Denies fever, malaise, fatigue, headache or abrupt weight changes.  HEENT: Denies eye pain, eye redness, ear pain, ringing in the ears, wax buildup, runny nose, nasal congestion, bloody nose, or sore throat. Respiratory: Denies difficulty breathing, shortness of breath, cough or sputum production.   Cardiovascular: Denies chest pain, chest tightness, palpitations or swelling in the hands or feet.  Gastrointestinal: Patient reports constipation.  Denies abdominal pain, bloating, diarrhea or blood in the stool.  GU: Denies urgency, frequency, pain with urination, burning sensation, blood in urine, odor or  discharge. Musculoskeletal: Denies decrease in range of motion, difficulty with gait, muscle pain or joint pain and swelling.  Skin: Denies redness, rashes, lesions or ulcercations.  Neurological: Denies dizziness, difficulty with memory, difficulty with speech or problems with balance and coordination.  Psych: Denies anxiety, depression, SI/HI.  No other specific complaints in a complete review of systems (except as listed in HPI above).  Objective:   Physical Exam   BP 112/60 (BP Location: Left Arm, Patient Position: Sitting, Cuff Size: Normal)   Pulse (!) 50   Temp (!) 95.9 F (35.5 C) (Temporal)   Wt 159 lb (72.1 kg)   SpO2 97%   BMI 28.17 kg/m   Wt Readings from Last 3 Encounters:  10/01/22 176 lb (79.8 kg)  04/21/22 185 lb (83.9 kg)  04/19/19 177 lb (80.3 kg)    General: Appears her stated age, overweight, in NAD. Skin: Warm, dry and intact.  HEENT: Head: normal shape and size; Eyes: sclera white, no icterus, conjunctiva pink, PERRLA and EOMs intact;  Neck:  Neck supple, trachea midline. No masses, lumps or thyromegaly present.  Cardiovascular: Bradycardic with irregular rhythm rhythm. No murmur, rubs or gallops noted. No JVD or BLE edema. No carotid bruits noted. Pulmonary/Chest: Normal effort and positive vesicular breath sounds. No respiratory distress. No wheezes, rales or ronchi noted.  Musculoskeletal:  No difficulty with gait.  Neurological: Alert and oriented. Coordination normal.  Psychiatric: Mood and affect normal. Behavior is normal. Judgment and thought content normal.    BMET    Component Value Date/Time   NA 140 10/01/2022 0815   NA 135 10/18/2014 1311   K 4.7 10/01/2022 0815   K 4.4 10/18/2014 1311   CL 105 10/01/2022 0815   CL 103 10/18/2014 1311   CO2 27 10/01/2022 0815   CO2 25 10/18/2014 1311   GLUCOSE 98 10/01/2022 0815   GLUCOSE 89 10/18/2014 1311   BUN 15 10/01/2022 0815   BUN 24 (H) 10/18/2014 1311   CREATININE 0.91 10/01/2022 0815    CALCIUM 10.0 10/01/2022 0815   CALCIUM 9.3 10/18/2014 1311   GFRNONAA 46 (L) 04/19/2019 1820   GFRNONAA >60 10/18/2014 1311   GFRAA 53 (L) 04/19/2019 1820   GFRAA >60 10/18/2014 1311    Lipid Panel     Component Value Date/Time   CHOL 159 10/01/2022 0815   TRIG 74 10/01/2022 0815   HDL 57 10/01/2022 0815   CHOLHDL 2.8 10/01/2022 0815   LDLCALC 86 10/01/2022 0815    CBC    Component Value Date/Time   WBC 6.6 10/01/2022 0815   RBC 4.74 10/01/2022 0815   HGB 14.0 10/01/2022 0815   HGB 12.5 10/18/2014 1311   HCT 43.3 10/01/2022 0815   HCT 38.3 10/18/2014 1311   PLT 251 10/01/2022 0815   PLT 238 10/18/2014 1311   MCV 91.4 10/01/2022 0815   MCV 93 10/18/2014 1311   MCH 29.5 10/01/2022 0815   MCHC 32.3 10/01/2022 0815   RDW 12.2 10/01/2022 0815   RDW 12.8 10/18/2014 1311   LYMPHSABS 2.4 04/02/2016 1508   LYMPHSABS 2.0 10/18/2014 1311   MONOABS 0.6 04/02/2016 1508   MONOABS 0.5 10/18/2014 1311   EOSABS 0.3 04/02/2016 1508   EOSABS 0.3 10/18/2014 1311   BASOSABS 0.1 04/02/2016 1508   BASOSABS 0.0 10/18/2014 1311    Hgb A1C Lab Results  Component Value Date   HGBA1C 6.0 (H) 10/01/2022           Assessment & Plan:     RTC in 6 months for annual exam Nicki Reaper, NP

## 2023-04-02 NOTE — Patient Instructions (Signed)
Prediabetes Eating Plan Prediabetes is a condition that causes blood sugar (glucose) levels to be higher than normal. This increases the risk for developing type 2 diabetes (type 2 diabetes mellitus). Working with a health care provider or nutrition specialist (dietitian) to make diet and lifestyle changes can help prevent the onset of diabetes. These changes may help you: Control your blood glucose levels. Improve your cholesterol levels. Manage your blood pressure. What are tips for following this plan? Reading food labels Read food labels to check the amount of fat, salt (sodium), and sugar in prepackaged foods. Avoid foods that have: Saturated fats. Trans fats. Added sugars. Avoid foods that have more than 300 milligrams (mg) of sodium per serving. Limit your sodium intake to less than 2,300 mg each day. Shopping Avoid buying pre-made and processed foods. Avoid buying drinks with added sugar. Cooking Cook with olive oil. Do not use butter, lard, or ghee. Bake, broil, grill, steam, or boil foods. Avoid frying. Meal planning  Work with your dietitian to create an eating plan that is right for you. This may include tracking how many calories you take in each day. Use a food diary, notebook, or mobile application to track what you eat at each meal. Consider following a Mediterranean diet. This includes: Eating several servings of fresh fruits and vegetables each day. Eating fish at least twice a week. Eating one serving each day of whole grains, beans, nuts, and seeds. Using olive oil instead of other fats. Limiting alcohol. Limiting red meat. Using nonfat or low-fat dairy products. Consider following a plant-based diet. This includes dietary choices that focus on eating mostly vegetables and fruit, grains, beans, nuts, and seeds. If you have high blood pressure, you may need to limit your sodium intake or follow a diet such as the DASH (Dietary Approaches to Stop Hypertension) eating  plan. The DASH diet aims to lower high blood pressure. Lifestyle Set weight loss goals with help from your health care team. It is recommended that most people with prediabetes lose 7% of their body weight. Exercise for at least 30 minutes 5 or more days a week. Attend a support group or seek support from a mental health counselor. Take over-the-counter and prescription medicines only as told by your health care provider. What foods are recommended? Fruits Berries. Bananas. Apples. Oranges. Grapes. Papaya. Mango. Pomegranate. Kiwi. Grapefruit. Cherries. Vegetables Lettuce. Spinach. Peas. Beets. Cauliflower. Cabbage. Broccoli. Carrots. Tomatoes. Squash. Eggplant. Herbs. Peppers. Onions. Cucumbers. Brussels sprouts. Grains Whole grains, such as whole-wheat or whole-grain breads, crackers, cereals, and pasta. Unsweetened oatmeal. Bulgur. Barley. Quinoa. Brown rice. Corn or whole-wheat flour tortillas or taco shells. Meats and other proteins Seafood. Poultry without skin. Lean cuts of pork and beef. Tofu. Eggs. Nuts. Beans. Dairy Low-fat or fat-free dairy products, such as yogurt, cottage cheese, and cheese. Beverages Water. Tea. Coffee. Sugar-free or diet soda. Seltzer water. Low-fat or nonfat milk. Milk alternatives, such as soy or almond milk. Fats and oils Olive oil. Canola oil. Sunflower oil. Grapeseed oil. Avocado. Walnuts. Sweets and desserts Sugar-free or low-fat pudding. Sugar-free or low-fat ice cream and other frozen treats. Seasonings and condiments Herbs. Sodium-free spices. Mustard. Relish. Low-salt, low-sugar ketchup. Low-salt, low-sugar barbecue sauce. Low-fat or fat-free mayonnaise. The items listed above may not be a complete list of recommended foods and beverages. Contact a dietitian for more information. What foods are not recommended? Fruits Fruits canned with syrup. Vegetables Canned vegetables. Frozen vegetables with butter or cream sauce. Grains Refined white  flour and flour   products, such as bread, pasta, snack foods, and cereals. Meats and other proteins Fatty cuts of meat. Poultry with skin. Breaded or fried meat. Processed meats. Dairy Full-fat yogurt, cheese, or milk. Beverages Sweetened drinks, such as iced tea and soda. Fats and oils Butter. Lard. Ghee. Sweets and desserts Baked goods, such as cake, cupcakes, pastries, cookies, and cheesecake. Seasonings and condiments Spice mixes with added salt. Ketchup. Barbecue sauce. Mayonnaise. The items listed above may not be a complete list of foods and beverages that are not recommended. Contact a dietitian for more information. Where to find more information American Diabetes Association: www.diabetes.org Summary You may need to make diet and lifestyle changes to help prevent the onset of diabetes. These changes can help you control blood sugar, improve cholesterol levels, and manage blood pressure. Set weight loss goals with help from your health care team. It is recommended that most people with prediabetes lose 7% of their body weight. Consider following a Mediterranean diet. This includes eating plenty of fresh fruits and vegetables, whole grains, beans, nuts, seeds, fish, and low-fat dairy, and using olive oil instead of other fats. This information is not intended to replace advice given to you by your health care provider. Make sure you discuss any questions you have with your health care provider. Document Revised: 09/07/2019 Document Reviewed: 09/07/2019 Elsevier Patient Education  2024 Elsevier Inc.  

## 2023-04-02 NOTE — Assessment & Plan Note (Signed)
In remission.

## 2023-04-02 NOTE — Assessment & Plan Note (Addendum)
TSH and free T4 today We will adjust levothyroxine if needed based on labs 

## 2023-04-03 LAB — CBC
HCT: 42.4 % (ref 35.0–45.0)
Hemoglobin: 13.8 g/dL (ref 11.7–15.5)
MCH: 29.9 pg (ref 27.0–33.0)
MCHC: 32.5 g/dL (ref 32.0–36.0)
MCV: 91.8 fL (ref 80.0–100.0)
MPV: 10.9 fL (ref 7.5–12.5)
Platelets: 293 10*3/uL (ref 140–400)
RBC: 4.62 10*6/uL (ref 3.80–5.10)
RDW: 12.5 % (ref 11.0–15.0)
WBC: 9.4 10*3/uL (ref 3.8–10.8)

## 2023-04-03 LAB — LIPID PANEL
Cholesterol: 213 mg/dL — ABNORMAL HIGH (ref <200)
HDL: 73 mg/dL (ref >=50)
LDL Cholesterol (Calc): 124 mg/dL — ABNORMAL HIGH
Non-HDL Cholesterol (Calc): 140 mg/dL — ABNORMAL HIGH (ref <130)
Total CHOL/HDL Ratio: 2.9 (calc) (ref <5.0)
Triglycerides: 68 mg/dL (ref <150)

## 2023-04-03 LAB — COMPLETE METABOLIC PANEL WITH GFR
AG Ratio: 1.6 (calc) (ref 1.0–2.5)
ALT: 12 U/L (ref 6–29)
AST: 14 U/L (ref 10–35)
Albumin: 4.4 g/dL (ref 3.6–5.1)
Alkaline phosphatase (APISO): 75 U/L (ref 37–153)
BUN/Creatinine Ratio: 19 (calc) (ref 6–22)
BUN: 19 mg/dL (ref 7–25)
CO2: 24 mmol/L (ref 20–32)
Calcium: 9.9 mg/dL (ref 8.6–10.4)
Chloride: 102 mmol/L (ref 98–110)
Creat: 1.02 mg/dL — ABNORMAL HIGH (ref 0.60–1.00)
Globulin: 2.8 g/dL (ref 1.9–3.7)
Glucose, Bld: 81 mg/dL (ref 65–99)
Potassium: 4.4 mmol/L (ref 3.5–5.3)
Sodium: 138 mmol/L (ref 135–146)
Total Bilirubin: 0.4 mg/dL (ref 0.2–1.2)
Total Protein: 7.2 g/dL (ref 6.1–8.1)
eGFR: 58 mL/min/{1.73_m2} — ABNORMAL LOW (ref >=60)

## 2023-04-03 LAB — HEMOGLOBIN A1C
Hgb A1c MFr Bld: 5.8 %{Hb} — ABNORMAL HIGH (ref <5.7)
Mean Plasma Glucose: 120 mg/dL
eAG (mmol/L): 6.6 mmol/L

## 2023-04-03 LAB — T4, FREE: Free T4: 1.4 ng/dL (ref 0.8–1.8)

## 2023-04-03 LAB — TSH: TSH: 2.33 m[IU]/L (ref 0.40–4.50)

## 2023-04-07 DIAGNOSIS — Z853 Personal history of malignant neoplasm of breast: Secondary | ICD-10-CM | POA: Diagnosis not present

## 2023-04-07 DIAGNOSIS — Z23 Encounter for immunization: Secondary | ICD-10-CM | POA: Diagnosis not present

## 2023-04-07 DIAGNOSIS — I480 Paroxysmal atrial fibrillation: Secondary | ICD-10-CM | POA: Diagnosis not present

## 2023-04-07 DIAGNOSIS — I491 Atrial premature depolarization: Secondary | ICD-10-CM | POA: Diagnosis not present

## 2023-05-04 DIAGNOSIS — Z1211 Encounter for screening for malignant neoplasm of colon: Secondary | ICD-10-CM | POA: Diagnosis not present

## 2023-05-04 DIAGNOSIS — Z1212 Encounter for screening for malignant neoplasm of rectum: Secondary | ICD-10-CM | POA: Diagnosis not present

## 2023-05-11 LAB — COLOGUARD: COLOGUARD: NEGATIVE

## 2023-06-23 ENCOUNTER — Other Ambulatory Visit: Payer: Self-pay | Admitting: Internal Medicine

## 2023-06-25 ENCOUNTER — Other Ambulatory Visit: Payer: Self-pay | Admitting: Internal Medicine

## 2023-06-25 NOTE — Telephone Encounter (Signed)
 Medication Refill -  Most Recent Primary Care Visit:  Provider: ANTONETTE ANGELINE ORN  Department: [facility]  Visit Type: OFFICE VISIT  Date: 04/02/2023  Medication: levothyroxine  (SYNTHROID ) 75 MCG tablet [[id number]]   Has the patient contacted their pharmacy? Yes  (Agent: If yes, when and what did the pharmacy advise?)Contact PCP   Is this the correct pharmacy for this prescription? Yes  This is the patient's preferred pharmacy:   [id number] - [place], [place] - [address]   Has the prescription been filled recently? Yes  Is the patient out of the medication? No  Has the patient been seen for an appointment in the last year OR does the patient have an upcoming appointment? Yes  Can we respond through MyChart? Yes  Agent: Please be advised that Rx refills may take up to 3 business days. We ask that you follow-up with your pharmacy.

## 2023-06-26 NOTE — Telephone Encounter (Signed)
 Requested Prescriptions  Pending Prescriptions Disp Refills   levothyroxine  (SYNTHROID ) 75 MCG tablet [Pharmacy Med Name: LEVOTHYROXINE  75 MCG TABLET] 90 tablet 1    Sig: TAKE 1 TABLET BY MOUTH DAILY BEFORE BREAKFAST.     Endocrinology:  Hypothyroid Agents Passed - 06/26/2023 12:53 PM      Passed - TSH in normal range and within 360 days    TSH  Date Value Ref Range Status  04/02/2023 2.33 0.40 - 4.50 mIU/L Final         Passed - Valid encounter within last 12 months    Recent Outpatient Visits           2 months ago Prediabetes   [place] [facility] [place], Angeline ORN, NP   8 months ago Encounter for general adult medical examination with abnormal findings   [place] [facility] [place], Angeline ORN, NP   1 year ago Acquired hypothyroidism   [place] [facility] [place], Angeline ORN, NP       Future Appointments             In 3 months Baity, Angeline ORN, NP [place] [facility], [facility]

## 2023-06-29 NOTE — Telephone Encounter (Signed)
 Requested medications are due for refill today.  no  Requested medications are on the active medications list.  yes  Last refill. 06/26/2023  Future visit scheduled.   yes  Notes to clinic.  Medication was refilled 06/26/2023. Pt is requesting that refill be sent to pharmacy in [place] . Please advise.    Requested Prescriptions  Pending Prescriptions Disp Refills   levothyroxine  (SYNTHROID ) 75 MCG tablet 90 tablet 1    Sig: Take 1 tablet (75 mcg total) by mouth daily before breakfast.     Endocrinology:  Hypothyroid Agents Passed - 06/29/2023  8:41 AM      Passed - TSH in normal range and within 360 days    TSH  Date Value Ref Range Status  04/02/2023 2.33 0.40 - 4.50 mIU/L Final         Passed - Valid encounter within last 12 months    Recent Outpatient Visits           2 months ago Prediabetes   [place] [facility] [place], Angeline ORN, NP   9 months ago Encounter for general adult medical examination with abnormal findings   [place] [facility] [place], Angeline ORN, NP   1 year ago Acquired hypothyroidism   [place] [facility] [place], Angeline ORN, NP       Future Appointments             In 3 months Baity, Angeline ORN, NP  [facility], [facility]

## 2023-07-16 ENCOUNTER — Encounter: Payer: Self-pay | Admitting: Hematology and Oncology

## 2023-07-30 ENCOUNTER — Ambulatory Visit (INDEPENDENT_AMBULATORY_CARE_PROVIDER_SITE_OTHER): Payer: PPO

## 2023-07-30 DIAGNOSIS — Z0000 Encounter for general adult medical examination without abnormal findings: Secondary | ICD-10-CM

## 2023-07-30 NOTE — Progress Notes (Signed)
 Subjective:   [name] is a 73 y.o. female who presents for Medicare Annual (Subsequent) preventive examination.  Visit Complete: Virtual I connected with  [name] on 07/30/23 by a audio enabled telemedicine application and verified that I am speaking with the correct person using two identifiers.  This patient declined Interactive audio and acupuncturist. Therefore the visit was completed with audio only.   Patient Location: Home  Provider Location: Office/Clinic  I discussed the limitations of evaluation and management by telemedicine. The patient expressed understanding and agreed to proceed.  Vital Signs: Because this visit was a virtual/telehealth visit, some criteria may be missing or patient reported. Any vitals not documented were not able to be obtained and vitals that have been documented are patient reported.      Objective:    There were no vitals filed for this visit. There is no height or weight on file to calculate BMI.     07/30/2023    4:00 PM 07/07/2022    8:57 AM 04/19/2019    6:19 PM 11/11/2015   10:41 AM 10/10/2015    3:05 PM 09/18/2015    1:56 PM 07/12/2015    2:23 PM  Advanced Directives  Does Patient Have a Medical Advance Directive? No No No No No No No  Would patient like information on creating a medical advance directive? No - Patient declined Yes (MAU/Ambulatory/Procedural Areas - Information given)  No - patient declined information No - patient declined information No - patient declined information     Current Medications (verified) Outpatient Encounter Medications as of 07/30/2023  Medication Sig   apixaban (ELIQUIS) 5 MG TABS tablet Take 5 mg by mouth 2 (two) times daily.   Cholecalciferol (VITAMIN D ) 125 MCG (5000 UT) CAPS Take by mouth.   levothyroxine  (SYNTHROID ) 75 MCG tablet TAKE 1 TABLET BY MOUTH DAILY BEFORE BREAKFAST.   metoprolol tartrate (LOPRESSOR) 25 MG tablet Take 25 mg by mouth 2 (two) times daily.   aspirin 81  MG tablet Take 81 mg by mouth daily. 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, stage 2a, Her2/neu +   COPD (chronic obstructive pulmonary disease) (HCC) 2014   seen on chest xray   Hepatitis A 1988   History of chemotherapy    Adriamycin/Cytoxan 12/20/2012-02/27/2013; 12 weekly cycles of Taxol from 03/21/2013-06/06/2013   History of mammogram 01/2014   History of radiation therapy    09/13/2013-11/08/2013   Hypothyroid    Osteopenia    Personal history of chemotherapy    Personal history of radiation therapy    Status post radiation therapy 10/2013   Left breast radiation   Uterine cancer [facility])    Past Surgical History:  Procedure Laterality Date   ABDOMINAL HYSTERECTOMY  1979   BREAST BIOPSY Left 2014   INVASIVE MAMMARY CARCINOMA   BREAST BIOPSY Left 2018   fat necrosis   BREAST LUMPECTOMY Left 2014   with Radiation   BREAST SURGERY Left 08-16-13   Needle/wire localization of Left breast    CARPAL TUNNEL RELEASE     COLONOSCOPY  09/14/2012   D[facility]ard [facility], MD; Normal exam.    PORTACATH PLACEMENT  2014   SPINAL FUSION     x 3   Family History  Problem Relation Age of Onset   Breast cancer Mother 48   Breast cancer Daughter    Social History   Socioeconomic History   Marital status: Single    Spouse name: Not on file   Number of children: 2   Years of education: Not on file   Highest education level: GED or equivalent  Occupational History   Occupation: Retired  Tobacco Use   Smoking status: Never   Smokeless  tobacco: Never  Vaping Use   Vaping status: Never Used  Substance and Sexual Activity   Alcohol use: No    Alcohol/week: 0.0 standard drinks of alcohol   Drug use: No   Sexual activity: Not on file  Other Topics Concern   Not on file  Social History Narrative   Not on file   Social Drivers of Health   Financial Resource Strain: Low Risk  (07/30/2023)   Overall Financial Resource Strain (CARDIA)    Difficulty of Paying Living Expenses: Not hard at all  Food Insecurity: No Food Insecurity (07/30/2023)   Hunger Vital Sign    Worried About Running Out of Food in the Last Year: Never true    Ran Out of Food in the Last Year: Never true  Transportation Needs: No Transportation Needs (07/30/2023)   PRAPARE - Administrator, Civil Service (Medical): No    Lack of Transportation (Non-Medical): No  Physical Activity: Insufficiently Active (07/30/2023)   Exercise Vital Sign    Days of Exercise per Week: 3 days    Minutes of Exercise per Session: 30 min  Stress: No Stress Concern Present (07/30/2023)   [employer] of Occupational Health - Occupational Stress Questionnaire    Feeling of Stress : Not at all  Social Connections: Moderately Isolated (07/30/2023)   Social Connection and Isolation Panel [NHANES]    Frequency of Communication with Friends and Family: More than three times a week    Frequency of Social Gatherings with Friends and Family: More than three times a week    Attends Religious Services: More than 4 times per year    Active Member of [employer] or Organizations: No    Attends Engineer, Structural: Never    Marital Status: Divorced    Tobacco Counseling Counseling given: Not Answered   Clinical Intake:  Pre-visit preparation completed: Yes  Pain : No/denies pain     BMI - recorded: 29.1 Nutritional Status: BMI 25 -29 Overweight Nutritional Risks: None Diabetes: No  How often do you need to have someone help you when you read instructions,  pamphlets, or other written materials from your doctor or pharmacy?: 1 - Never  Interpreter Needed?: No  Information entered by :: Kelly DAS, LPN   Activities of Daily Living    07/30/2023    4:01 PM 04/02/2023    2:22 PM  In your present state of health, do you have any difficulty performing the following activities:  Hearing? 0 0  Vision? 0 0  Difficulty concentrating or making decisions? 0 0  Walking or climbing stairs? 0 0  Dressing or bathing? 0 0  Doing errands, shopping? 0 0  Preparing Food and eating ? N   Using the Toilet? N   In the past six months, have you accidently leaked urine? N   Do you have  problems with loss of bowel control? N   Managing your Medications? N   Managing your Finances? N   Housekeeping or managing your Housekeeping? N     Patient Care Team: Antonette Angeline ORN, NP as PCP - General (Internal Medicine) Dessa Reyes ORN, MD (General Surgery) Wilder Pink, MD (Unknown Physician Specialty) Mevelyn JONETTA Bathe, OD (Optometry)  Indicate any recent Medical Services you may have received from other than [facility] providers in the past year (date may be approximate).     Assessment:   This is a routine wellness examination for [name].  Hearing/Vision screen Hearing Screening - Comments:: NO AIDS Vision Screening - Comments:: WEARS CONTACTS- [name]   Goals Addressed             This Visit's Progress    DIET - EAT MORE FRUITS AND VEGETABLES         Depression Screen    07/30/2023    3:59 PM 04/02/2023    2:22 PM 10/01/2022    8:17 AM 07/07/2022    8:40 AM 04/21/2022   10:41 AM 11/11/2015   10:42 AM  PHQ 2/9 Scores  PHQ - 2 Score 0 0 0 0 0 0  PHQ- 9 Score 0   0      Fall Risk    07/30/2023    4:01 PM 04/02/2023    2:22 PM 10/01/2022    8:17 AM 07/07/2022    8:39 AM 04/21/2022   10:42 AM  Fall Risk   Falls in the past year? 0 0 0 0 0  Number falls in past yr: 0   0   Injury with Fall? 0 0 0 0 0  Risk for fall due to : No Fall Risks No  Fall Risks No Fall Risks No Fall Risks   Follow up Falls prevention discussed;Falls evaluation completed   Falls evaluation completed     MEDICARE RISK AT HOME: Medicare Risk at Home Any stairs in or around the home?: Yes If so, are there any without handrails?: No Home free of loose throw rugs in walkways, pet beds, electrical cords, etc?: Yes Adequate lighting in your home to reduce risk of falls?: Yes Life alert?: No Use of a cane, walker or w/c?: No Grab bars in the bathroom?: No Shower chair or bench in shower?: No Elevated toilet seat or a handicapped toilet?: No  TIMED UP AND GO:  Was the test performed?  No    Cognitive Function:        07/30/2023    4:03 PM 07/07/2022    8:41 AM  6CIT Screen  What Year? 0 points 0 points  What month? 0 points 0 points  What time? 0 points 0 points  Count back from 20 0 points 0 points  Months in reverse 0 points 0 points  Repeat phrase 0 points 0 points  Total Score 0 points 0 points    Immunizations Immunization History  Administered Date(s) Administered   Moderna Sars-Covid-2 Vaccination 11/04/2019, 12/02/2019, 06/20/2020   Pneumococcal Conjugate-13 10/01/2016   Pneumococcal Polysaccharide-23 04/08/2015   Tdap 05/01/2019    TDAP status: Up to date  Flu Vaccine status: Declined, Education has been provided regarding the importance of this vaccine but patient still declined. Advised may receive this vaccine at local pharmacy or Health Dept. Aware to provide a copy of the vaccination record if obtained from local pharmacy or Health Dept. Verbalized acceptance and understanding.  Pneumococcal vaccine status: Declined,  Education has been provided regarding  the importance of this vaccine but patient still declined. Advised may receive this vaccine at local pharmacy or Health Dept. Aware to provide a copy of the vaccination record if obtained from local pharmacy or Health Dept. Verbalized acceptance and understanding.   Covid-19  vaccine status: Completed vaccines  Qualifies for Shingles Vaccine? Yes   Zostavax completed No   Shingrix Completed?: No.    Education has been provided regarding the importance of this vaccine. Patient has been advised to call insurance company to determine out of pocket expense if they have not yet received this vaccine. Advised may also receive vaccine at local pharmacy or Health Dept. Verbalized acceptance and understanding.  Screening Tests Health Maintenance  Topic Date Due   Zoster Vaccines- Shingrix (1 of 2) Never done   INFLUENZA VACCINE  09/20/2023 (Originally 01/21/2023)   Pneumonia Vaccine 68+ Years old (3 of 3 - PPSV23 or PCV20) 10/01/2023 (Originally 10/01/2021)   MAMMOGRAM  03/03/2024   Medicare Annual Wellness (AWV)  07/29/2024   Fecal DNA (Cologuard)  05/03/2026   DTaP/Tdap/Td (2 - Td or Tdap) 04/30/2029   DEXA SCAN  Completed   Hepatitis C Screening  Completed   HPV VACCINES  Aged Out   Colonoscopy  Discontinued   COVID-19 Vaccine  Discontinued    Health Maintenance  Health Maintenance Due  Topic Date Due   Zoster Vaccines- Shingrix (1 of 2) Never done    Colorectal cancer screening: Type of screening: Cologuard. Completed 05/04/23. Repeat every 3 years  Mammogram status: Completed 03/04/23. Repeat every year  Bone Density status: Completed 03/04/23. Results reflect: Bone density results: OSTEOPOROSIS. Repeat every 2 years.  Lung Cancer Screening: (Low Dose CT Chest recommended if Age 78-80 years, 20 pack-year currently smoking OR have quit w/in 15years.) does not qualify.    Additional Screening:  Hepatitis C Screening: does qualify; Completed 10/01/22  Vision Screening: Recommended annual ophthalmology exams for early detection of glaucoma and other disorders of the eye. Is the patient up to date with their annual eye exam?  Yes  Who is the provider or what is the name of the office in which the patient attends annual eye exams? [name] If pt is not  established with a provider, would they like to be referred to a provider to establish care? No .   Dental Screening: Recommended annual dental exams for proper oral hygiene  Community Resource Referral / Chronic Care Management: CRR required this visit?  No   CCM required this visit?  No     Plan:     I have personally reviewed and noted the following in the patient's chart:   Medical and social history Use of alcohol, tobacco or illicit drugs  Current medications and supplements including opioid prescriptions. Patient is not currently taking opioid prescriptions. Functional ability and status Nutritional status Physical activity Advanced directives List of other physicians Hospitalizations, surgeries, and ER visits in previous 12 months Vitals Screenings to include cognitive, depression, and falls Referrals and appointments  In addition, I have reviewed and discussed with patient certain preventive protocols, quality metrics, and best practice recommendations. A written personalized care plan for preventive services as well as general preventive health recommendations were provided to patient.     Kelly GORMAN Das, LPN   12/21/7972   After Visit Summary: (MyChart) Due to this being a telephonic visit, the after visit summary with patients personalized plan was offered to patient via MyChart   Nurse Notes: NONE

## 2023-07-30 NOTE — Patient Instructions (Addendum)
 Ms. Tramontana , Thank you for taking time to come for your Medicare Wellness Visit. I appreciate your ongoing commitment to your health goals. Please review the following plan we discussed and let me know if I can assist you in the future.   Referrals/Orders/Follow-Ups/Clinician Recommendations: NONE  This is a list of the screening recommended for you and due dates:  Health Maintenance  Topic Date Due   Zoster (Shingles) Vaccine (1 of 2) Never done   Flu Shot  09/20/2023*   Pneumonia Vaccine (3 of 3 - PPSV23 or PCV20) 10/01/2023*   Mammogram  03/03/2024   Medicare Annual Wellness Visit  07/29/2024   Cologuard (Stool DNA test)  05/03/2026   DTaP/Tdap/Td vaccine (2 - Td or Tdap) 04/30/2029   DEXA scan (bone density measurement)  Completed   Hepatitis C Screening  Completed   HPV Vaccine  Aged Out   Colon Cancer Screening  Discontinued   COVID-19 Vaccine  Discontinued  *Topic was postponed. The date shown is not the original due date.    Advanced directives: (ACP Link)Information on Advanced Care Planning can be found at [place]  Secretary of [facility] Advance Health Care Directives Advance Health Care Directives ([email or web address]   Next Medicare Annual Wellness Visit scheduled for next year: Yes   08/04/24 @ 4:00 PM BY PHONE

## 2023-08-11 DIAGNOSIS — B349 Viral infection, unspecified: Secondary | ICD-10-CM | POA: Diagnosis not present

## 2023-09-21 ENCOUNTER — Encounter: Payer: Self-pay | Admitting: Hematology and Oncology

## 2023-10-11 ENCOUNTER — Ambulatory Visit: Payer: Self-pay | Admitting: Internal Medicine

## 2023-10-22 ENCOUNTER — Encounter: Payer: Self-pay | Admitting: Internal Medicine

## 2023-10-22 ENCOUNTER — Ambulatory Visit (INDEPENDENT_AMBULATORY_CARE_PROVIDER_SITE_OTHER): Payer: PPO | Admitting: Internal Medicine

## 2023-10-22 VITALS — BP 108/64 | Ht 63.0 in | Wt 169.8 lb

## 2023-10-22 DIAGNOSIS — E039 Hypothyroidism, unspecified: Secondary | ICD-10-CM

## 2023-10-22 DIAGNOSIS — Z1231 Encounter for screening mammogram for malignant neoplasm of breast: Secondary | ICD-10-CM

## 2023-10-22 DIAGNOSIS — Z6830 Body mass index (BMI) 30.0-30.9, adult: Secondary | ICD-10-CM

## 2023-10-22 DIAGNOSIS — R7303 Prediabetes: Secondary | ICD-10-CM

## 2023-10-22 DIAGNOSIS — E66811 Obesity, class 1: Secondary | ICD-10-CM

## 2023-10-22 DIAGNOSIS — E6609 Other obesity due to excess calories: Secondary | ICD-10-CM

## 2023-10-22 DIAGNOSIS — Z0001 Encounter for general adult medical examination with abnormal findings: Secondary | ICD-10-CM

## 2023-10-22 DIAGNOSIS — R739 Hyperglycemia, unspecified: Secondary | ICD-10-CM

## 2023-10-22 NOTE — Assessment & Plan Note (Signed)
 Encouraged diet and exercise for weight loss ?

## 2023-10-22 NOTE — Patient Instructions (Signed)
 Health Maintenance for Postmenopausal Women Menopause is a normal process in which your ability to get pregnant comes to an end. This process happens slowly over many months or years, usually between the ages of 24 and 62. Menopause is complete when you have missed your menstrual period for 12 months. It is important to talk with your health care provider about some of the most common conditions that affect women after menopause (postmenopausal women). These include heart disease, cancer, and bone loss (osteoporosis). Adopting a healthy lifestyle and getting preventive care can help to promote your health and wellness. The actions you take can also lower your chances of developing some of these common conditions. What are the signs and symptoms of menopause? During menopause, you may have the following symptoms: Hot flashes. These can be moderate or severe. Night sweats. Decrease in sex drive. Mood swings. Headaches. Tiredness (fatigue). Irritability. Memory problems. Problems falling asleep or staying asleep. Talk with your health care provider about treatment options for your symptoms. Do I need hormone replacement therapy? Hormone replacement therapy is effective in treating symptoms that are caused by menopause, such as hot flashes and night sweats. Hormone replacement carries certain risks, especially as you become older. If you are thinking about using estrogen or estrogen with progestin, discuss the benefits and risks with your health care provider. How can I reduce my risk for heart disease and stroke? The risk of heart disease, heart attack, and stroke increases as you age. One of the causes may be a change in the body's hormones during menopause. This can affect how your body uses dietary fats, triglycerides, and cholesterol. Heart attack and stroke are medical emergencies. There are many things that you can do to help prevent heart disease and stroke. Watch your blood pressure High  blood pressure causes heart disease and increases the risk of stroke. This is more likely to develop in people who have high blood pressure readings or are overweight. Have your blood pressure checked: Every 3-5 years if you are 50-75 years of age. Every year if you are 77 years old or older. Eat a healthy diet  Eat a diet that includes plenty of vegetables, fruits, low-fat dairy products, and lean protein. Do not eat a lot of foods that are high in solid fats, added sugars, or sodium. Get regular exercise Get regular exercise. This is one of the most important things you can do for your health. Most adults should: Try to exercise for at least 150 minutes each week. The exercise should increase your heart rate and make you sweat (moderate-intensity exercise). Try to do strengthening exercises at least twice each week. Do these in addition to the moderate-intensity exercise. Spend less time sitting. Even light physical activity can be beneficial. Other tips Work with your health care provider to achieve or maintain a healthy weight. Do not use any products that contain nicotine or tobacco. These products include cigarettes, chewing tobacco, and vaping devices, such as e-cigarettes. If you need help quitting, ask your health care provider. Know your numbers. Ask your health care provider to check your cholesterol and your blood sugar (glucose). Continue to have your blood tested as directed by your health care provider. Do I need screening for cancer? Depending on your health history and family history, you may need to have cancer screenings at different stages of your life. This may include screening for: Breast cancer. Cervical cancer. Lung cancer. Colorectal cancer. What is my risk for osteoporosis? After menopause, you may be  at increased risk for osteoporosis. Osteoporosis is a condition in which bone destruction happens more quickly than new bone creation. To help prevent osteoporosis or  the bone fractures that can happen because of osteoporosis, you may take the following actions: If you are 61-3 years old, get at least 1,000 mg of calcium and at least 600 international units (IU) of vitamin D per day. If you are older than age 61 but younger than age 75, get at least 1,200 mg of calcium and at least 600 international units (IU) of vitamin D per day. If you are older than age 62, get at least 1,200 mg of calcium and at least 800 international units (IU) of vitamin D per day. Smoking and drinking excessive alcohol increase the risk of osteoporosis. Eat foods that are rich in calcium and vitamin D, and do weight-bearing exercises several times each week as directed by your health care provider. How does menopause affect my mental health? Depression may occur at any age, but it is more common as you become older. Common symptoms of depression include: Feeling depressed. Changes in sleep patterns. Changes in appetite or eating patterns. Feeling an overall lack of motivation or enjoyment of activities that you previously enjoyed. Frequent crying spells. Talk with your health care provider if you think that you are experiencing any of these symptoms. General instructions See your health care provider for regular wellness exams and vaccines. This may include: Scheduling regular health, dental, and eye exams. Getting and maintaining your vaccines. These include: Influenza vaccine. Get this vaccine each year before the flu season begins. Pneumonia vaccine. Shingles vaccine. Tetanus, diphtheria, and pertussis (Tdap) booster vaccine. Your health care provider may also recommend other immunizations. Tell your health care provider if you have ever been abused or do not feel safe at home. Summary Menopause is a normal process in which your ability to get pregnant comes to an end. This condition causes hot flashes, night sweats, decreased interest in sex, mood swings, headaches, or lack  of sleep. Treatment for this condition may include hormone replacement therapy. Take actions to keep yourself healthy, including exercising regularly, eating a healthy diet, watching your weight, and checking your blood pressure and blood sugar levels. Get screened for cancer and depression. Make sure that you are up to date with all your vaccines. This information is not intended to replace advice given to you by your health care provider. Make sure you discuss any questions you have with your health care provider. Document Revised: 10/28/2020 Document Reviewed: 10/28/2020 Elsevier Patient Education  2024 [employer].

## 2023-10-22 NOTE — Progress Notes (Signed)
 Subjective:    Patient ID: [name], female    DOB: 1950-07-16, 73 y.o.   MRN: [id number]  HPI  Patient presents to clinic today for her annual exam.  Flu: never Tetanus: 04/2019 COVID: Moderna x 3 Pneumovax: 03/2015 Prevnar: 09/2016 Shingrix: never Pap smear: Hysterectomy Mammogram: 02/2023 Bone density: 02/2023 Colon screening: 04/2023, Cologuard Vision screening: annually Dentist: biannually  Diet: She does eat meat. She consumes some fruits, more veggies. She tries to avoid fried foods. She drinks mostly water. 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, stage 2a, Her2/neu +   COPD (chronic obstructive pulmonary disease) (HCC) 2014   seen on chest xray   Hepatitis A 1988   History of chemotherapy    Adriamycin/Cytoxan 12/20/2012-02/27/2013; 12 weekly cycles of Taxol from 03/21/2013-06/06/2013   History of mammogram 01/2014   History of radiation therapy    09/13/2013-11/08/2013   Hypothyroid    Osteopenia    Personal history of chemotherapy    Personal history of radiation therapy    Status post radiation therapy 10/2013   Left breast radiation   Uterine cancer (HCC)     Current Outpatient Medications  Medication Sig Dispense Refill   apixaban (ELIQUIS) 5 MG TABS tablet Take 5 mg by mouth 2 (two) times daily.     aspirin 81 MG tablet Take 81 mg by mouth daily. (Patient not taking: Reported on 07/30/2023)     Cholecalciferol (VITAMIN D ) 125 MCG (5000 UT) CAPS Take by mouth.     levothyroxine  (SYNTHROID ) 75 MCG tablet TAKE 1 TABLET BY MOUTH DAILY BEFORE BREAKFAST. 90 tablet 1   metoprolol tartrate (LOPRESSOR) 25 MG tablet Take 25 mg by mouth 2 (two) times daily.     No current facility-administered medications for this visit.   Facility-Administered Medications Ordered in Other Visits   Medication Dose Route Frequency Provider Last Rate Last Admin   fulvestrant  (FASLODEX ) injection 500 mg  500 mg Intramuscular Q30 days Corcoran, Melissa C, MD   500 mg at 11/16/14 1558    Allergies  Allergen Reactions   Prednisone Rash   Tamoxifen Citrate Other (See Comments)    Constipation and irritability   Anastrozole Other (See Comments)    constipation , irritability   Codeine Nausea And Vomiting    sick   Dilaudid [Hydromorphone Hcl]    Letrozole  Hives   Other Other (See Comments)    congestion   Ibuprofen  Rash   Prednisone Rash   Sulfa Antibiotics Rash and Itching   Tramadol Nausea And Vomiting, Itching and Rash    Family History  Problem Relation Age of Onset   Breast cancer Mother 65   Breast cancer Daughter     Social History   Socioeconomic History   Marital status: Single    Spouse name: Not on file   Number of children: 2   Years of education: Not on file   Highest education level: GED or equivalent  Occupational History   Occupation: Retired  Tobacco Use   Smoking status: Never   Smokeless tobacco: Never  Vaping Use   Vaping status: Never Used  Substance and Sexual Activity   Alcohol use: No    Alcohol/week: 0.0 standard drinks of alcohol   Drug use: No   Sexual activity:  Not on file  Other Topics Concern   Not on file  Social History Narrative   Not on file   Social Drivers of Health   Financial Resource Strain: Low Risk  (07/30/2023)   Overall Financial Resource Strain (CARDIA)    Difficulty of Paying Living Expenses: Not hard at all  Food Insecurity: No Food Insecurity (07/30/2023)   Hunger Vital Sign    Worried About Running Out of Food in the Last Year: Never true    Ran Out of Food in the Last Year: Never true  Transportation Needs: No Transportation Needs (07/30/2023)   PRAPARE - Administrator, Civil Service (Medical): No    Lack of Transportation (Non-Medical): No  Physical Activity: Insufficiently Active (07/30/2023)    Exercise Vital Sign    Days of Exercise per Week: 3 days    Minutes of Exercise per Session: 30 min  Stress: No Stress Concern Present (07/30/2023)   [employer] of Occupational Health - Occupational Stress Questionnaire    Feeling of Stress : Not at all  Social Connections: Moderately Isolated (07/30/2023)   Social Connection and Isolation Panel [NHANES]    Frequency of Communication with Friends and Family: More than three times a week    Frequency of Social Gatherings with Friends and Family: More than three times a week    Attends Religious Services: More than 4 times per year    Active Member of [employer] or Organizations: No    Attends Banker Meetings: Never    Marital Status: Divorced  Catering manager Violence: Not At Risk (07/30/2023)   Humiliation, Afraid, Rape, and Kick questionnaire    Fear of Current or Ex-Partner: No    Emotionally Abused: No    Physically Abused: No    Sexually Abused: No     Constitutional: Denies fever, malaise, fatigue, headache or abrupt weight changes.  HEENT: Denies eye pain, eye redness, ear pain, ringing in the ears, wax buildup, runny nose, nasal congestion, bloody nose, or sore throat. Respiratory: Denies difficulty breathing, shortness of breath, cough or sputum production.   Cardiovascular: Denies chest pain, chest tightness, palpitations or swelling in the hands or feet.  Gastrointestinal: Patient reports constipation.  Denies abdominal pain, bloating, diarrhea or blood in the stool.  GU: Denies urgency, frequency, pain with urination, burning sensation, blood in urine, odor or discharge. Musculoskeletal: Denies decrease in range of motion, difficulty with gait, muscle pain or joint pain and swelling.  Skin: Denies redness, rashes, lesions or ulcercations.  Neurological: Denies dizziness, difficulty with memory, difficulty with speech or problems with balance and coordination.  Psych: Denies anxiety, depression, SI/HI.  No other  specific complaints in a complete review of systems (except as listed in HPI above).  Objective:   Physical Exam  BP 108/64 (BP Location: Left Arm, Patient Position: Sitting, Cuff Size: Normal)   Ht 5\' 3"  (1.6 m)   Wt 169 lb 12.8 oz (77 kg)   BMI 30.08 kg/m    Wt Readings from Last 3 Encounters:  04/02/23 159 lb (72.1 kg)  10/01/22 176 lb (79.8 kg)  04/21/22 185 lb (83.9 kg)    General: Appears her stated age, obese, in NAD. Skin: Warm, dry and intact.  Sun damaged skin noted. HEENT: Head: normal shape and size; Eyes: sclera white, no icterus, conjunctiva pink, PERRLA and EOMs intact;  Neck:  Neck supple, trachea midline. No masses, lumps or thyromegaly present.  Cardiovascular: Normal rate and rhythm. S1,S2 noted.  No  murmur, rubs or gallops noted. No JVD or BLE edema.  Varicose veins noted bilaterally.  No carotid bruits noted. Pulmonary/Chest: Normal effort and positive vesicular breath sounds. No respiratory distress. No wheezes, rales or ronchi noted.  Abdomen: Soft and nontender. Normal bowel sounds.  Musculoskeletal: Strength 5/5 BUE/BLE.  No difficulty with gait.  Neurological: Alert and oriented. Cranial nerves II-XII grossly intact. Coordination normal.  Psychiatric: Mood and affect normal. Behavior is normal. Judgment and thought content normal.    BMET    Component Value Date/Time   NA 138 04/02/2023 1420   NA 135 10/18/2014 1311   K 4.4 04/02/2023 1420   K 4.4 10/18/2014 1311   CL 102 04/02/2023 1420   CL 103 10/18/2014 1311   CO2 24 04/02/2023 1420   CO2 25 10/18/2014 1311   GLUCOSE 81 04/02/2023 1420   GLUCOSE 89 10/18/2014 1311   BUN 19 04/02/2023 1420   BUN 24 (H) 10/18/2014 1311   CREATININE 1.02 (H) 04/02/2023 1420   CALCIUM 9.9 04/02/2023 1420   CALCIUM 9.3 10/18/2014 1311   GFRNONAA 46 (L) 04/19/2019 1820   GFRNONAA >60 10/18/2014 1311   GFRAA 53 (L) 04/19/2019 1820   GFRAA >60 10/18/2014 1311    Lipid Panel     Component Value Date/Time    CHOL 213 (H) 04/02/2023 1420   TRIG 68 04/02/2023 1420   HDL 73 04/02/2023 1420   CHOLHDL 2.9 04/02/2023 1420   LDLCALC 124 (H) 04/02/2023 1420    CBC    Component Value Date/Time   WBC 9.4 04/02/2023 1420   RBC 4.62 04/02/2023 1420   HGB 13.8 04/02/2023 1420   HGB 12.5 10/18/2014 1311   HCT 42.4 04/02/2023 1420   HCT 38.3 10/18/2014 1311   PLT 293 04/02/2023 1420   PLT 238 10/18/2014 1311   MCV 91.8 04/02/2023 1420   MCV 93 10/18/2014 1311   MCH 29.9 04/02/2023 1420   MCHC 32.5 04/02/2023 1420   RDW 12.5 04/02/2023 1420   RDW 12.8 10/18/2014 1311   LYMPHSABS 2.4 04/02/2016 1508   LYMPHSABS 2.0 10/18/2014 1311   MONOABS 0.6 04/02/2016 1508   MONOABS 0.5 10/18/2014 1311   EOSABS 0.3 04/02/2016 1508   EOSABS 0.3 10/18/2014 1311   BASOSABS 0.1 04/02/2016 1508   BASOSABS 0.0 10/18/2014 1311    Hgb A1C Lab Results  Component Value Date   HGBA1C 5.8 (H) 04/02/2023           Assessment & Plan:   Preventative Health Maintenance:  Encouraged her to get a flu shot in the fall Tetanus UTD Encouraged her to get her COVID-booster Pneumovax and Prevnar 13 UTD She declines Prevnar 20 today Discussed Shingrix vaccine, she will check coverage with her insurance company and schedule a visit if she would like to have this done She no longer needs Pap smears Mammogram ordered-she will call to schedule Bone density UTD Colon screening UTD Encouraged her to consume a balanced diet and exercise regimen Advised her to see an eye doctor and dentist annually We will check CBC, c-Met, TSH, free T4, lipid, A1c  today  RTC in 6 months, follow-up chronic conditions Helayne Lo, NP

## 2023-10-23 LAB — LIPID PANEL
Cholesterol: 192 mg/dL (ref <200)
HDL: 64 mg/dL (ref >=50)
LDL Cholesterol (Calc): 109 mg/dL — ABNORMAL HIGH
Non-HDL Cholesterol (Calc): 128 mg/dL (ref <130)
Total CHOL/HDL Ratio: 3 (calc) (ref <5.0)
Triglycerides: 101 mg/dL (ref <150)

## 2023-10-23 LAB — COMPREHENSIVE METABOLIC PANEL WITH GFR
AG Ratio: 1.4 (calc) (ref 1.0–2.5)
ALT: 15 U/L (ref 6–29)
AST: 17 U/L (ref 10–35)
Albumin: 4.5 g/dL (ref 3.6–5.1)
Alkaline phosphatase (APISO): 70 U/L (ref 37–153)
BUN: 19 mg/dL (ref 7–25)
CO2: 25 mmol/L (ref 20–32)
Calcium: 10.1 mg/dL (ref 8.6–10.4)
Chloride: 105 mmol/L (ref 98–110)
Creat: 0.9 mg/dL (ref 0.60–1.00)
Globulin: 3.2 g/dL (ref 1.9–3.7)
Glucose, Bld: 104 mg/dL — ABNORMAL HIGH (ref 65–99)
Potassium: 4.4 mmol/L (ref 3.5–5.3)
Sodium: 140 mmol/L (ref 135–146)
Total Bilirubin: 0.5 mg/dL (ref 0.2–1.2)
Total Protein: 7.7 g/dL (ref 6.1–8.1)
eGFR: 68 mL/min/{1.73_m2} (ref >=60)

## 2023-10-23 LAB — CBC
HCT: 44.3 % (ref 35.0–45.0)
Hemoglobin: 14.2 g/dL (ref 11.7–15.5)
MCH: 30.1 pg (ref 27.0–33.0)
MCHC: 32.1 g/dL (ref 32.0–36.0)
MCV: 93.9 fL (ref 80.0–100.0)
MPV: 11.2 fL (ref 7.5–12.5)
Platelets: 227 10*3/uL (ref 140–400)
RBC: 4.72 10*6/uL (ref 3.80–5.10)
RDW: 12.6 % (ref 11.0–15.0)
WBC: 7.9 10*3/uL (ref 3.8–10.8)

## 2023-10-23 LAB — HEMOGLOBIN A1C
Hgb A1c MFr Bld: 6 % — ABNORMAL HIGH (ref <5.7)
Mean Plasma Glucose: 126 mg/dL
eAG (mmol/L): 7 mmol/L

## 2023-10-23 LAB — TSH: TSH: 3.49 m[IU]/L (ref 0.40–4.50)

## 2023-10-23 LAB — T4, FREE: Free T4: 1.5 ng/dL (ref 0.8–1.8)

## 2023-10-25 ENCOUNTER — Encounter: Payer: Self-pay | Admitting: Internal Medicine

## 2023-12-18 ENCOUNTER — Other Ambulatory Visit: Payer: Self-pay | Admitting: Internal Medicine

## 2023-12-20 ENCOUNTER — Other Ambulatory Visit: Payer: Self-pay

## 2023-12-20 NOTE — Telephone Encounter (Signed)
 Requested Prescriptions  Pending Prescriptions Disp Refills   levothyroxine  (SYNTHROID ) 75 MCG tablet [Pharmacy Med Name: LEVOTHYROXINE  75 MCG TABLET] 90 tablet 3    Sig: TAKE 1 TABLET BY MOUTH DAILY BEFORE BREAKFAST.     Endocrinology:  Hypothyroid Agents Passed - 12/20/2023  5:46 PM      Passed - TSH in normal range and within 360 days    TSH  Date Value Ref Range Status  10/22/2023 3.49 0.40 - 4.50 mIU/L Final         Passed - Valid encounter within last 12 months    Recent Outpatient Visits           1 month ago Encounter for general adult medical examination with abnormal findings   [place] [facility] [place], Angeline ORN, NP

## 2024-02-23 ENCOUNTER — Encounter: Payer: Self-pay | Admitting: Internal Medicine

## 2024-02-23 ENCOUNTER — Ambulatory Visit (INDEPENDENT_AMBULATORY_CARE_PROVIDER_SITE_OTHER): Admitting: Internal Medicine

## 2024-02-23 VITALS — BP 122/70 | Ht 63.0 in | Wt 180.4 lb

## 2024-02-23 DIAGNOSIS — C44729 Squamous cell carcinoma of skin of left lower limb, including hip: Secondary | ICD-10-CM

## 2024-02-23 DIAGNOSIS — Z4802 Encounter for removal of sutures: Secondary | ICD-10-CM | POA: Diagnosis not present

## 2024-02-23 NOTE — Patient Instructions (Signed)
Suture Removal, Care After The following information offers guidance on how to care for yourself after your procedure. Your health care provider may also give you more specific instructions. If you have problems or questions, contact your health care provider. What can I expect after the procedure? After your stitches (sutures) are removed, it is common to have: Some discomfort and swelling in the area. Slight redness in the area. Follow these instructions at home: If you have a dressing: Wash your hands with soap and water for at least 20 seconds before and after you change your bandage (dressing). If soap and water are not available, use hand sanitizer. Change your dressing as told by your health care provider. If your dressing becomes wet or dirty, or develops a bad smell, change it as soon as possible. If your dressing sticks to your skin, pour warm, clean water over it until it loosens and can be removed without pulling apart the wound edges. Pat the area dry with a soft, clean towel. Do not rub the wound because that may cause bleeding. Wound care  Check your wound every day for signs of infection. Check for: More redness, swelling, or pain. Fluid or blood. New warmth, a rash, or hardness at the wound site. Pus or a bad smell. Wash your hands with soap and water for at least 20 seconds before and after touching your wound. If soap and water are not available, use hand sanitizer. Keep the wound area dry and clean. Clean and pat the wound dry as told by your health care provider. Apply cream or ointment only as told by your health care provider. If skin glue or adhesive strips were applied after sutures were removed, leave these closures in place. They may need to stay in place for 2 weeks or longer. If adhesive strip edges start to loosen and curl up, you may trim the loose edges. Do not remove adhesive strips completely unless your health care provider tells you to do that. Continue to  protect the wound from injury. Do not pick at your wound. Picking can cause an infection. Bathing Do not take baths, swim, or use a hot tub until your health care provider approves. Ask your health care provider if you may take showers. Follow these steps for showering: If you have a dressing, remove it before getting into the shower. In the shower, allow soapy water to get on the wound. Avoid scrubbing the wound. When you get out of the shower, dry the wound by patting it with a clean towel. Reapply a dressing over the wound, if needed. Scar care When your wound has completely healed, help decrease the size of your scar by: Wearing sunscreen over the scar or covering it with clothing when you are outside. New scars get sunburned easily, which can make scarring worse. Gently massaging the scarred area. This can decrease scar thickness. General instructions Take over-the-counter and prescription medicines only as told by your health care provider. Keep all follow-up visits. This is important. Contact a health care provider if: You have more redness, swelling, or pain around your wound. You have fluid or blood coming from your wound. You have new warmth, a rash, or hardness at the wound site. You have pus or a bad smell coming from your wound. Your wound opens up. Get help right away if: You have a fever or chills. You have red streaks coming from your wound. Summary After your sutures are removed, it is common to have some discomfort  and swelling in the area. Wash your hands with soap and water before you change your bandage (dressing). Keep the wound area dry and clean. Do not take baths, swim, or use a hot tub until your health care provider approves. This information is not intended to replace advice given to you by your health care provider. Make sure you discuss any questions you have with your health care provider. Document Revised: 10/01/2020 Document Reviewed:  10/01/2020 Elsevier Patient Education  2024 [employer].

## 2024-02-23 NOTE — Progress Notes (Signed)
 Subjective:    Patient ID: [name], female    DOB: November 24, 1950, 73 y.o.   MRN: [id number]  HPI   Discussed the use of AI scribe software for clinical note transcription with the patient, who gave verbal consent to proceed.  [name] is a 73 year old female who presents for suture removal.  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 The dermatologist instructed her to have the sutures removed. She chose to have this done at her primary care provider's office to avoid a three-hour drive back to the dermatologist.  Post-procedure, the area was sore for a couple of days, but she applied Vaseline as part of her wound care. There has been no redness, swelling, or drainage at the site.  The dermatologist sent instructions that he placed a running mattress suture and 3 interrupted single sutures.  Her medical history includes multiple skin cancers, with five previous excisions and numerous lesions frozen off her chest. She has a history of significant sun exposure, including the use of a tanning bed at home, which she has since discontinued. She spends considerable time at the beach and has a granddaughter living in [place] , whom she visits annually.        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, stage 2a, Her2/neu +   COPD (chronic obstructive pulmonary disease) (HCC) 2014   seen on chest xray   Hepatitis A 1988   History of chemotherapy    Adriamycin/Cytoxan 12/20/2012-02/27/2013; 12 weekly cycles of Taxol from 03/21/2013-06/06/2013   History of mammogram 01/2014   History of radiation therapy    09/13/2013-11/08/2013   Hypothyroid    Osteopenia    Personal history of chemotherapy    Personal history of radiation therapy    Status post radiation therapy 10/2013   Left breast radiation   Uterine  cancer (HCC)     Current Outpatient Medications  Medication Sig Dispense Refill   apixaban (ELIQUIS) 5 MG TABS tablet Take 5 mg by mouth 2 (two) times daily.     aspirin 81 MG tablet Take 81 mg by mouth daily. (Patient not taking: Reported on 07/30/2023)     Cholecalciferol (VITAMIN D ) 125 MCG (5000 UT) CAPS Take by mouth.     levothyroxine  (SYNTHROID ) 75 MCG tablet TAKE 1 TABLET BY MOUTH DAILY BEFORE BREAKFAST. 90 tablet 3   metoprolol tartrate (LOPRESSOR) 25 MG tablet Take 25 mg by mouth daily.     No current facility-administered medications for this visit.   Facility-Administered Medications Ordered in Other Visits  Medication Dose Route Frequency Provider Last Rate Last Admin   fulvestrant  (FASLODEX ) injection 500 mg  500 mg Intramuscular Q30 days Corcoran, Melissa C, MD   500 mg at 11/16/14 1558    Allergies  Allergen Reactions   Prednisone Rash   Tamoxifen Citrate Other (See Comments)    Constipation and irritability   Anastrozole Other (See Comments)    constipation , irritability   Codeine Nausea And Vomiting    sick   Dilaudid [Hydromorphone Hcl]    Letrozole  Hives   Other Other (See Comments)    congestion   Ibuprofen  Rash   Prednisone Rash   Sulfa Antibiotics Rash and Itching   Tramadol Nausea And Vomiting, Itching and  Rash    Family History  Problem Relation Age of Onset   Breast cancer Mother 16   Breast cancer Daughter     Social History   Socioeconomic History   Marital status: Single    Spouse name: Not on file   Number of children: 2   Years of education: Not on file   Highest education level: 12th grade  Occupational History   Occupation: Retired  Tobacco Use   Smoking status: Never   Smokeless tobacco: Never  Vaping Use   Vaping status: Never Used  Substance and Sexual Activity   Alcohol use: No    Alcohol/week: 0.0 standard drinks of alcohol   Drug use: No   Sexual activity: Not on file  Other Topics Concern   Not on file  Social  History Narrative   Not on file   Social Drivers of Health   Financial Resource Strain: Low Risk  (02/19/2024)   Overall Financial Resource Strain (CARDIA)    Difficulty of Paying Living Expenses: Not very hard  Food Insecurity: No Food Insecurity (02/19/2024)   Hunger Vital Sign    Worried About Running Out of Food in the Last Year: Never true    Ran Out of Food in the Last Year: Never true  Transportation Needs: No Transportation Needs (02/19/2024)   PRAPARE - Administrator, Civil Service (Medical): No    Lack of Transportation (Non-Medical): No  Physical Activity: Insufficiently Active (02/19/2024)   Exercise Vital Sign    Days of Exercise per Week: 3 days    Minutes of Exercise per Session: 30 min  Stress: No Stress Concern Present (02/19/2024)   [employer] of Occupational Health - Occupational Stress Questionnaire    Feeling of Stress: Not at all  Social Connections: Moderately Isolated (02/19/2024)   Social Connection and Isolation Panel    Frequency of Communication with Friends and Family: More than three times a week    Frequency of Social Gatherings with Friends and Family: Once a week    Attends Religious Services: More than 4 times per year    Active Member of [employer] or Organizations: No    Attends Engineer, structural: Not on file    Marital Status: Divorced  Intimate Partner Violence: Not At Risk (07/30/2023)   Humiliation, Afraid, Rape, and Kick questionnaire    Fear of Current or Ex-Partner: No    Emotionally Abused: No    Physically Abused: No    Sexually Abused: No     Constitutional: Denies fever, malaise, fatigue, headache or abrupt weight changes.  Respiratory: Denies difficulty breathing, shortness of breath, cough or sputum production.   Cardiovascular: Denies chest pain, chest tightness, palpitations or swelling in the hands or feet.  Skin: Patient reports biopsy site to left lower extremity.  Denies redness, rashes, lesions or  ulcercations.  Neurological: Denies dizziness, difficulty with memory, difficulty with speech or problems with balance and coordination.    No other specific complaints in a complete review of systems (except as listed in HPI above).  Objective:   Physical Exam  BP 122/70 (BP Location: Left Arm, Patient Position: Sitting, Cuff Size: Normal)   Ht 5' 3 (1.6 m)   Wt 180 lb 6.4 oz (81.8 kg)   BMI 31.96 kg/m     Wt Readings from Last 3 Encounters:  10/22/23 169 lb 12.8 oz (77 kg)  04/02/23 159 lb (72.1 kg)  10/01/22 176 lb (79.8 kg)    General: Appears  her stated age, obese, in NAD. Skin: Warm, dry and intact.  Sun damaged skin noted.  Sick centimeter biopsy site noted of left anterior lower leg. Cardiovascular: Normal rate and rhythm.  Pulmonary/Chest: Normal effort. No respiratory distress.  Neurological: Alert and oriented.   BMET    Component Value Date/Time   NA 140 10/22/2023 0909   NA 135 10/18/2014 1311   K 4.4 10/22/2023 0909   K 4.4 10/18/2014 1311   CL 105 10/22/2023 0909   CL 103 10/18/2014 1311   CO2 25 10/22/2023 0909   CO2 25 10/18/2014 1311   GLUCOSE 104 (H) 10/22/2023 0909   GLUCOSE 89 10/18/2014 1311   BUN 19 10/22/2023 0909   BUN 24 (H) 10/18/2014 1311   CREATININE 0.90 10/22/2023 0909   CALCIUM 10.1 10/22/2023 0909   CALCIUM 9.3 10/18/2014 1311   GFRNONAA 46 (L) 04/19/2019 1820   GFRNONAA >60 10/18/2014 1311   GFRAA 53 (L) 04/19/2019 1820   GFRAA >60 10/18/2014 1311    Lipid Panel     Component Value Date/Time   CHOL 192 10/22/2023 0909   TRIG 101 10/22/2023 0909   HDL 64 10/22/2023 0909   CHOLHDL 3.0 10/22/2023 0909   LDLCALC 109 (H) 10/22/2023 0909    CBC    Component Value Date/Time   WBC 7.9 10/22/2023 0909   RBC 4.72 10/22/2023 0909   HGB 14.2 10/22/2023 0909   HGB 12.5 10/18/2014 1311   HCT 44.3 10/22/2023 0909   HCT 38.3 10/18/2014 1311   PLT 227 10/22/2023 0909   PLT 238 10/18/2014 1311   MCV 93.9 10/22/2023 0909   MCV  93 10/18/2014 1311   MCH 30.1 10/22/2023 0909   MCHC 32.1 10/22/2023 0909   RDW 12.6 10/22/2023 0909   RDW 12.8 10/18/2014 1311   LYMPHSABS 2.4 04/02/2016 1508   LYMPHSABS 2.0 10/18/2014 1311   MONOABS 0.6 04/02/2016 1508   MONOABS 0.5 10/18/2014 1311   EOSABS 0.3 04/02/2016 1508   EOSABS 0.3 10/18/2014 1311   BASOSABS 0.1 04/02/2016 1508   BASOSABS 0.0 10/18/2014 1311    Hgb A1C Lab Results  Component Value Date   HGBA1C 6.0 (H) 10/22/2023           Assessment & Plan:  Assessment and Plan    Suture removal after excision of skin cancer Sutures from squamous cell carcinoma excision removed. Clear margins achieved. Difficult removal may cause lumpy scar. Advised against tanning and excessive sun exposure. - Advise against tanning and excessive sun exposure to prevent further skin damage.  - She will continue to follow with dermatologist   RTC in 2 months, follow-up chronic conditions Angeline Laura, NP

## 2024-03-01 ENCOUNTER — Other Ambulatory Visit: Payer: Self-pay | Admitting: Medical Genetics

## 2024-03-06 ENCOUNTER — Encounter

## 2024-03-06 ENCOUNTER — Ambulatory Visit
Admission: RE | Admit: 2024-03-06 | Discharge: 2024-03-06 | Disposition: A | Discharge status: Home / Self Care | Source: Ambulatory Visit | Attending: Internal Medicine | Admitting: Internal Medicine

## 2024-03-06 DIAGNOSIS — Z1231 Encounter for screening mammogram for malignant neoplasm of breast: Secondary | ICD-10-CM | POA: Diagnosis present

## 2024-04-05 ENCOUNTER — Ambulatory Visit (INDEPENDENT_AMBULATORY_CARE_PROVIDER_SITE_OTHER): Admitting: Internal Medicine

## 2024-04-05 ENCOUNTER — Encounter: Payer: Self-pay | Admitting: Internal Medicine

## 2024-04-05 ENCOUNTER — Ambulatory Visit: Payer: Self-pay

## 2024-04-05 VITALS — BP 118/70 | HR 65 | Ht 63.0 in | Wt 179.2 lb

## 2024-04-05 DIAGNOSIS — J3089 Other allergic rhinitis: Secondary | ICD-10-CM | POA: Diagnosis not present

## 2024-04-05 DIAGNOSIS — J019 Acute sinusitis, unspecified: Secondary | ICD-10-CM | POA: Diagnosis not present

## 2024-04-05 DIAGNOSIS — B9789 Other viral agents as the cause of diseases classified elsewhere: Secondary | ICD-10-CM | POA: Diagnosis not present

## 2024-04-05 MED ORDER — AMOXICILLIN-POT CLAVULANATE 875-125 MG PO TABS: 1.0000 | ORAL_TABLET | Freq: Two times a day (BID) | ORAL | 0 refills | Status: DC

## 2024-04-05 NOTE — Patient Instructions (Signed)
 Allergic Rhinitis, Adult  Allergic rhinitis is a reaction to allergens. Allergens are things that can cause an allergic reaction. This condition affects the lining inside the nose (mucous membrane). There are two types of allergic rhinitis: Seasonal. This type is also called hay fever. It happens only during some times of the year. Perennial. This type can happen at any time of the year. This condition cannot be spread from person to person (is not contagious). It can be mild, bad, or very bad. It can develop at any age and may be outgrown. What are the causes? Pollen from grasses, trees, and weeds. Other causes can be: Dust mites. Smoke. Mold. Car fumes. The pee (urine), spit, or dander of pets. Dander is dead skin cells from a pet. What increases the risk? You are more likely to develop this condition if: You have allergies in your family. You have problems like allergies in your family. You may have: Swelling of parts of your eyes and eyelids. Asthma. This affects how you breathe. Long-term redness and swelling on your skin. Food allergies. What are the signs or symptoms? The main symptom of this condition is a runny or stuffy nose (nasal congestion). Other symptoms may include: Sneezing or coughing. Itching and tearing of your eyes. Mucus that drips down the back of your throat (postnasal drip). This may cause a sore throat. Trouble sleeping. Feeling tired. Headache. How is this treated? There is no cure for this condition. You should avoid things that you are allergic to. Treatment can help to relieve symptoms. This may include: Medicines that block allergy symptoms, such as anti-inflammatories or antihistamines. These may be given as a shot, nasal spray, or pill. Avoiding things you are allergic to. Medicines that give you some of what you are allergic to over time. This is called immunotherapy. It is done if other treatments do not help. You may get: Shots. Medicine under  your tongue. Stronger medicines, if other treatments do not help. Follow these instructions at home: Avoiding allergens Find out what things you are allergic to and avoid them. To do this, try these things: If you get allergies any time of year: Replace carpet with wood, tile, or vinyl flooring. Carpet can trap pet dander and dust. Do not smoke. Do not allow smoking in your home. Change your heating and air conditioning filters at least once a month. If you get allergies only some times of the year: Keep windows closed when you can. Plan things to do outside when pollen counts are lowest. Check pollen counts before you plan things to do outside. When you come indoors, change your clothes and shower before you sit on furniture or bedding. If you are allergic to a pet: Keep the pet out of your bedroom. Vacuum, sweep, and dust often. General instructions Take over-the-counter and prescription medicines only as told by your doctor. Drink enough fluid to keep your pee pale yellow. Where to find more information American Academy of Allergy, Asthma & Immunology: aaaai.org Contact a doctor if: You have a fever. You get a cough that does not go away. You make high-pitched whistling sounds when you breathe, most often when you breathe out (wheeze). Your symptoms slow you down. Your symptoms stop you from doing your normal things each day. Get help right away if: You are short of breath. This symptom may be an emergency. Get help right away. Call 911. Do not wait to see if the symptom will go away. Do not drive yourself to the  hospital. This information is not intended to replace advice given to you by your health care provider. Make sure you discuss any questions you have with your health care provider. Document Revised: 02/16/2022 Document Reviewed: 02/16/2022 Elsevier Patient Education  2024 [employer].

## 2024-04-05 NOTE — Telephone Encounter (Signed)
 FYI Only or Action Required?: Action required by provider: request for appointment.  Patient was last seen in primary care on 02/23/2024 by Antonette Angeline ORN, NP.  Called Nurse Triage reporting Sinusitis.  Symptoms began yesterday.  Interventions attempted: OTC medications: sinus medication.  Symptoms are: gradually worsening.  Triage Disposition: See Physician Within 24 Hours  Patient/caregiver understands and will follow disposition?: YesCopied from CRM #[id number]. Topic: Clinical - Red Word Triage >> Apr 05, 2024  8:47 AM Joesph NOVAK wrote: Red Word that prompted transfer to Nurse Triage:  Sinus infection, pressure in her head, eyes hurt, ears are in pain. Reason for Disposition  Earache  Answer Assessment - Initial Assessment Questions Pt has been taking OTC sinus medication.     1. LOCATION: Where does it hurt?      Entire face 2. ONSET: When did the sinus pain start?  (e.g., hours, days)      yesterday 3. SEVERITY: How bad is the pain?   (Scale 0-10; or none, mild, moderate or severe)     5 4. RECURRENT SYMPTOM: Have you ever had sinus problems before? If Yes, ask: When was the last time? and What happened that time?      yes 5. NASAL CONGESTION: Is the nose blocked? If Yes, ask: Can you open it or must you breathe through your mouth?     yes 6. NASAL DISCHARGE: Do you have discharge from your nose? If so ask, What color?     denies 7. FEVER: Do you have a fever? If Yes, ask: What is it, how was it measured, and when did it start?      Not sure 8. OTHER SYMPTOMS: Do you have any other symptoms? (e.g., sore throat, cough, earache, difficulty breathing)     Ear pain  Protocols used: Sinus Pain or Congestion-A-AH

## 2024-04-05 NOTE — Telephone Encounter (Signed)
 Will discuss at upcoming appointment

## 2024-04-05 NOTE — Progress Notes (Signed)
 Subjective:    Patient ID: [name], female    DOB: Jul 25, 1950, 73 y.o.   MRN: [id number]  HPI  Discussed the use of AI scribe software for clinical note transcription with the patient, who gave verbal consent to proceed.  [name] is a 73 year old female who presents with symptoms suggestive of a sinus infection.  She has been experiencing symptoms of a sinus infection, including headache and sinus pressure, which began 1 week ago but worsened yesterday. Over-the-counter sinus pain and pressure medication (Equate brand) has been used for about a week and a half, providing relief when taken. Nasal symptoms are described as both runny and congested, particularly when the afrin nasal spray wears off. No nasal discharge is noted when blowing her nose. Ear pain was experienced last night along with a sore throat, which improved after taking medication that dried up the drainage. No cough, shortness of breath, fever, chills, body aches, nausea, vomiting, or diarrhea are present.  She has a history of seasonal allergies around this time of year, typically managed with sinus pills. Antihistamines like Claritin, Allegra, and Zyrtec have been tried for several months prior without significant relief. Flonase nasal spray has not been used despite having it available because she reports it is ineffective for her.  She has not undergone sinus surgery, although it was recommended in the past due to recurrent sinus infections. She declined the procedure, which included suggestions for ear tubes and removal of adenoids and tonsils.    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, stage 2a, Her2/neu +   COPD (chronic obstructive pulmonary disease) (HCC) 2014   seen on chest xray   Hepatitis A 1988   History of chemotherapy    Adriamycin/Cytoxan 12/20/2012-02/27/2013;  12 weekly cycles of Taxol from 03/21/2013-06/06/2013   History of mammogram 01/2014   History of radiation therapy    09/13/2013-11/08/2013   Hypothyroid    Osteopenia    Personal history of chemotherapy    Personal history of radiation therapy    Status post radiation therapy 10/2013   Left breast radiation   Uterine cancer (HCC)     Current Outpatient Medications  Medication Sig Dispense Refill   apixaban (ELIQUIS) 5 MG TABS tablet Take 5 mg by mouth 2 (two) times daily.     levothyroxine  (SYNTHROID ) 75 MCG tablet TAKE 1 TABLET BY MOUTH DAILY BEFORE BREAKFAST. 90 tablet 3   metoprolol succinate (TOPROL-XL) 25 MG 24 hr tablet Take 25 mg by mouth daily.     metoprolol tartrate (LOPRESSOR) 25 MG tablet Take 25 mg by mouth daily.     No current facility-administered medications for this visit.   Facility-Administered Medications Ordered in Other Visits  Medication Dose Route Frequency Provider Last Rate Last Admin   fulvestrant  (FASLODEX ) injection 500 mg  500 mg Intramuscular Q30 days Corcoran, Melissa C, MD   500 mg at 11/16/14 1558    Allergies  Allergen Reactions   Prednisone Rash   Tamoxifen Citrate Other (See Comments)    Constipation and irritability   Anastrozole Other (See Comments)    constipation , irritability   Codeine Nausea And Vomiting    sick   Dilaudid [Hydromorphone Hcl]    Letrozole  Hives   Other Other (See Comments)    congestion  Ibuprofen  Rash   Prednisone Rash   Sulfa Antibiotics Rash and Itching   Tramadol Nausea And Vomiting, Itching and Rash    Family History  Problem Relation Age of Onset   Breast cancer Mother 43   Breast cancer Daughter     Social History   Socioeconomic History   Marital status: Single    Spouse name: Not on file   Number of children: 2   Years of education: Not on file   Highest education level: 12th grade  Occupational History   Occupation: Retired  Tobacco Use   Smoking status: Never   Smokeless tobacco:  Never  Vaping Use   Vaping status: Never Used  Substance and Sexual Activity   Alcohol use: No    Alcohol/week: 0.0 standard drinks of alcohol   Drug use: No   Sexual activity: Not on file  Other Topics Concern   Not on file  Social History Narrative   Not on file   Social Drivers of Health   Financial Resource Strain: Low Risk  (02/19/2024)   Overall Financial Resource Strain (CARDIA)    Difficulty of Paying Living Expenses: Not very hard  Food Insecurity: No Food Insecurity (02/19/2024)   Hunger Vital Sign    Worried About Running Out of Food in the Last Year: Never true    Ran Out of Food in the Last Year: Never true  Transportation Needs: No Transportation Needs (02/19/2024)   PRAPARE - Administrator, Civil Service (Medical): No    Lack of Transportation (Non-Medical): No  Physical Activity: Insufficiently Active (02/19/2024)   Exercise Vital Sign    Days of Exercise per Week: 3 days    Minutes of Exercise per Session: 30 min  Stress: No Stress Concern Present (02/19/2024)   [employer] of Occupational Health - Occupational Stress Questionnaire    Feeling of Stress: Not at all  Social Connections: Moderately Isolated (02/19/2024)   Social Connection and Isolation Panel    Frequency of Communication with Friends and Family: More than three times a week    Frequency of Social Gatherings with Friends and Family: Once a week    Attends Religious Services: More than 4 times per year    Active Member of [employer] or Organizations: No    Attends Banker Meetings: Not on file    Marital Status: Divorced  Intimate Partner Violence: Not At Risk (07/30/2023)   Humiliation, Afraid, Rape, and Kick questionnaire    Fear of Current or Ex-Partner: No    Emotionally Abused: No    Physically Abused: No    Sexually Abused: No     Constitutional: Patient reports headache.  Denies fever, malaise, fatigue, or abrupt weight changes.  HEENT: Patient reports sinus  pressure, ear fullness, runny nose, nasal congestion.  Denies eye pain, eye redness, ear pain, ringing in the ears, wax buildup, bloody nose, or sore throat. Respiratory: Denies difficulty breathing, shortness of breath, cough or sputum production.   Cardiovascular: Denies chest pain, chest tightness, palpitations or swelling in the hands or feet.  Gastrointestinal: Patient reports constipation.  Denies abdominal pain, bloating, diarrhea or blood in the stool.  Musculoskeletal: Denies decrease in range of motion, difficulty with gait, muscle pain or joint pain and swelling.  Skin: Denies redness, rashes, lesions or ulcercations.  Neurological: Denies dizziness, difficulty with memory, difficulty with speech or problems with balance and coordination.    No other specific complaints in a complete review of systems (except as  listed in HPI above).  Objective:   Physical Exam  BP 118/70 (BP Location: Left Arm, Patient Position: Sitting, Cuff Size: Normal)   Pulse 65   Ht 5' 3 (1.6 m)   Wt 179 lb 3.2 oz (81.3 kg)   SpO2 98%   BMI 31.74 kg/m     Wt Readings from Last 3 Encounters:  02/23/24 180 lb 6.4 oz (81.8 kg)  10/22/23 169 lb 12.8 oz (77 kg)  04/02/23 159 lb (72.1 kg)    General: Appears her stated age, obese, in NAD. HEENT: Head: normal shape and size, maxillary and frontal sinus tenderness noted; Eyes: sclera white, no icterus, conjunctiva pink, PERRLA and EOMs intact; Nose: mucosa erythematous and dry, turbinates swollen; Throat: mucosa erythematous but moist, + PND, no exudate or lesions noted. Neck: No adenopathy noted. Cardiovascular: Normal rate and rhythm. S1,S2 noted.  No murmur, rubs or gallops noted.  Pulmonary/Chest: Normal effort and positive vesicular breath sounds. No respiratory distress. No wheezes, rales or ronchi noted.  Neurological: Alert and oriented.   BMET    Component Value Date/Time   NA 140 10/22/2023 0909   NA 135 10/18/2014 1311   K 4.4  10/22/2023 0909   K 4.4 10/18/2014 1311   CL 105 10/22/2023 0909   CL 103 10/18/2014 1311   CO2 25 10/22/2023 0909   CO2 25 10/18/2014 1311   GLUCOSE 104 (H) 10/22/2023 0909   GLUCOSE 89 10/18/2014 1311   BUN 19 10/22/2023 0909   BUN 24 (H) 10/18/2014 1311   CREATININE 0.90 10/22/2023 0909   CALCIUM 10.1 10/22/2023 0909   CALCIUM 9.3 10/18/2014 1311   GFRNONAA 46 (L) 04/19/2019 1820   GFRNONAA >60 10/18/2014 1311   GFRAA 53 (L) 04/19/2019 1820   GFRAA >60 10/18/2014 1311    Lipid Panel     Component Value Date/Time   CHOL 192 10/22/2023 0909   TRIG 101 10/22/2023 0909   HDL 64 10/22/2023 0909   CHOLHDL 3.0 10/22/2023 0909   LDLCALC 109 (H) 10/22/2023 0909    CBC    Component Value Date/Time   WBC 7.9 10/22/2023 0909   RBC 4.72 10/22/2023 0909   HGB 14.2 10/22/2023 0909   HGB 12.5 10/18/2014 1311   HCT 44.3 10/22/2023 0909   HCT 38.3 10/18/2014 1311   PLT 227 10/22/2023 0909   PLT 238 10/18/2014 1311   MCV 93.9 10/22/2023 0909   MCV 93 10/18/2014 1311   MCH 30.1 10/22/2023 0909   MCHC 32.1 10/22/2023 0909   RDW 12.6 10/22/2023 0909   RDW 12.8 10/18/2014 1311   LYMPHSABS 2.4 04/02/2016 1508   LYMPHSABS 2.0 10/18/2014 1311   MONOABS 0.6 04/02/2016 1508   MONOABS 0.5 10/18/2014 1311   EOSABS 0.3 04/02/2016 1508   EOSABS 0.3 10/18/2014 1311   BASOSABS 0.1 04/02/2016 1508   BASOSABS 0.0 10/18/2014 1311    Hgb A1C Lab Results  Component Value Date   HGBA1C 6.0 (H) 10/22/2023           Assessment & Plan:  Assessment and Plan    Allergic rhinitis, viral sinusitis Significant nasal swelling due to inflammation, not bacterial infection. Frequent sinus infections and seasonal allergies noted. Afrin effective but not for long-term use. Allergic to prednisone. -Advised her that if she could take the Flonase nasal spray 2 times a day for the next week that her symptoms would likely resolve.  She reports Flonase does not work for her and the only thing that  helps is  an antibiotic - Prescribed Augmentin 875 mg/125 mg twice a day for 10 days.  Discussed antibiotic overus - Advised against long-term use of Afrin nasal spray.  Allergic rhinitis Seasonal allergies with ineffective relief from previous antihistamines. Flonase recommended for its anti-inflammatory effects. - Recommend starting Claritin, Allegra, or Zyrtec once a day before cold weather. - Encourage use of Flonase nasal spray twice a day.        RTC in 1 months, follow-up chronic conditions Angeline Laura, NP

## 2024-04-19 ENCOUNTER — Ambulatory Visit (INDEPENDENT_AMBULATORY_CARE_PROVIDER_SITE_OTHER): Admitting: Internal Medicine

## 2024-04-19 ENCOUNTER — Encounter: Payer: Self-pay | Admitting: Internal Medicine

## 2024-04-19 VITALS — BP 118/74 | Ht 63.0 in | Wt 179.8 lb

## 2024-04-19 DIAGNOSIS — R102 Pelvic and perineal pain unspecified side: Secondary | ICD-10-CM

## 2024-04-19 DIAGNOSIS — R300 Dysuria: Secondary | ICD-10-CM | POA: Diagnosis not present

## 2024-04-19 DIAGNOSIS — R350 Frequency of micturition: Secondary | ICD-10-CM

## 2024-04-19 DIAGNOSIS — R310 Gross hematuria: Secondary | ICD-10-CM

## 2024-04-19 LAB — POCT URINE DIPSTICK
Bilirubin, UA: NEGATIVE
Glucose, UA: NEGATIVE mg/dL
Ketones, POC UA: NEGATIVE mg/dL
Nitrite, UA: NEGATIVE
POC PROTEIN,UA: NEGATIVE
Spec Grav, UA: 1.015 (ref 1.010–1.025)
Urobilinogen, UA: 0.2 U/dL
pH, UA: 6 (ref 5.0–8.0)

## 2024-04-19 MED ORDER — NITROFURANTOIN MONOHYD MACRO 100 MG PO CAPS: 100.0000 mg | ORAL_CAPSULE | Freq: Two times a day (BID) | ORAL | 0 refills | Status: AC

## 2024-04-19 NOTE — Progress Notes (Signed)
 Subjective:    Patient ID: [name], female    DOB: 1951/06/19, 73 y.o.   MRN: [id number]  HPI  Discussed the use of AI scribe software for clinical note transcription with the patient, who gave verbal consent to proceed.  [name] is a 73 year old female who presents with urinary symptoms suggestive of a urinary tract infection.  Her symptoms began yesterday around 12:30 PM, characterized by severe burning with urination, pressure, and urinary frequency immediately after voiding. She took two doses of leftover amoxicillin yesterday, which temporarily alleviated the symptoms. This morning, she no longer experiences burning sensation but still feels unwell.  She noticed her urine appeared reddish-orange yesterday, suggesting possible hematuria. No vaginal symptoms. This morning, she experienced some nausea but no fever, chills, or vomiting.  Her past medical history includes a previous procedure to stretch her urinary tract due to incomplete bladder emptying.    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, stage 2a, Her2/neu +   COPD (chronic obstructive pulmonary disease) (HCC) 2014   seen on chest xray   Hepatitis A 1988   History of chemotherapy    Adriamycin/Cytoxan 12/20/2012-02/27/2013; 12 weekly cycles of Taxol from 03/21/2013-06/06/2013   History of mammogram 01/2014   History of radiation therapy    09/13/2013-11/08/2013   Hypothyroid    Osteopenia    Personal history of chemotherapy    Personal history of radiation therapy    Status post radiation therapy 10/2013   Left breast radiation   Uterine cancer (HCC)     Current Outpatient Medications  Medication Sig Dispense Refill   amoxicillin-clavulanate (AUGMENTIN) 875-125 MG tablet Take 1 tablet by mouth 2 (two) times daily. 20 tablet 0   apixaban (ELIQUIS) 5 MG TABS tablet Take 5 mg  by mouth 2 (two) times daily.     levothyroxine  (SYNTHROID ) 75 MCG tablet TAKE 1 TABLET BY MOUTH DAILY BEFORE BREAKFAST. 90 tablet 3   metoprolol succinate (TOPROL-XL) 25 MG 24 hr tablet Take 25 mg by mouth daily.     metoprolol tartrate (LOPRESSOR) 25 MG tablet Take 25 mg by mouth daily.     No current facility-administered medications for this visit.   Facility-Administered Medications Ordered in Other Visits  Medication Dose Route Frequency Provider Last Rate Last Admin   fulvestrant  (FASLODEX ) injection 500 mg  500 mg Intramuscular Q30 days Corcoran, Melissa C, MD   500 mg at 11/16/14 1558    Allergies  Allergen Reactions   Prednisone Rash   Tamoxifen Citrate Other (See Comments)    Constipation and irritability   Anastrozole Other (See Comments)    constipation , irritability   Codeine Nausea And Vomiting    sick   Dilaudid [Hydromorphone Hcl]    Letrozole  Hives   Other Other (See Comments)    congestion   Ibuprofen  Rash   Prednisone Rash   Sulfa Antibiotics Rash and Itching   Tramadol Nausea And Vomiting, Itching and Rash    Family History  Problem Relation Age of Onset   Breast cancer Mother 63   Breast cancer Daughter     Social History   Socioeconomic History   Marital status: Single    Spouse name: Not on file   Number of children: 2   Years of education: Not on file  Highest education level: 12th grade  Occupational History   Occupation: Retired  Tobacco Use   Smoking status: Never   Smokeless tobacco: Never  Vaping Use   Vaping status: Never Used  Substance and Sexual Activity   Alcohol use: No    Alcohol/week: 0.0 standard drinks of alcohol   Drug use: No   Sexual activity: Not on file  Other Topics Concern   Not on file  Social History Narrative   Not on file   Social Drivers of Health   Financial Resource Strain: Low Risk  (02/19/2024)   Overall Financial Resource Strain (CARDIA)    Difficulty of Paying Living Expenses: Not very hard   Food Insecurity: No Food Insecurity (02/19/2024)   Hunger Vital Sign    Worried About Running Out of Food in the Last Year: Never true    Ran Out of Food in the Last Year: Never true  Transportation Needs: No Transportation Needs (02/19/2024)   PRAPARE - Administrator, Civil Service (Medical): No    Lack of Transportation (Non-Medical): No  Physical Activity: Insufficiently Active (02/19/2024)   Exercise Vital Sign    Days of Exercise per Week: 3 days    Minutes of Exercise per Session: 30 min  Stress: No Stress Concern Present (02/19/2024)   [employer] of Occupational Health - Occupational Stress Questionnaire    Feeling of Stress: Not at all  Social Connections: Moderately Isolated (02/19/2024)   Social Connection and Isolation Panel    Frequency of Communication with Friends and Family: More than three times a week    Frequency of Social Gatherings with Friends and Family: Once a week    Attends Religious Services: More than 4 times per year    Active Member of [employer] or Organizations: No    Attends Engineer, Structural: Not on file    Marital Status: Divorced  Intimate Partner Violence: Not At Risk (07/30/2023)   Humiliation, Afraid, Rape, and Kick questionnaire    Fear of Current or Ex-Partner: No    Emotionally Abused: No    Physically Abused: No    Sexually Abused: No     Constitutional: Denies fever, malaise, fatigue, headache or abrupt weight changes.  Respiratory: Denies difficulty breathing, shortness of breath, cough or sputum production.   Cardiovascular: Denies chest pain, chest tightness, palpitations or swelling in the hands or feet.  Gastrointestinal: Patient reports nausea, constipation, pelvic pressure.  Denies bloating, diarrhea or blood in the stool.  GU: Patient reports burning with urination, urinary frequency, blood in urine.  Denies urgency, pain with urination, odor or discharge. Musculoskeletal: Denies decrease in range of motion,  difficulty with gait, muscle pain or joint pain and swelling.  Skin: Denies redness, rashes, lesions or ulcercations.  No other specific complaints in a complete review of systems (except as listed in HPI above).  Objective:   Physical Exam  BP 118/74 (BP Location: Left Arm, Patient Position: Sitting, Cuff Size: Normal)   Ht 5' 3 (1.6 m)   Wt 179 lb 12.8 oz (81.6 kg)   BMI 31.85 kg/m     Wt Readings from Last 3 Encounters:  04/05/24 179 lb 3.2 oz (81.3 kg)  02/23/24 180 lb 6.4 oz (81.8 kg)  10/22/23 169 lb 12.8 oz (77 kg)    General: Appears her stated age, obese, in NAD. Cardiovascular: Normal rate and rhythm.  Pulmonary/Chest: Normal effort and positive vesicular breath sounds. No respiratory distress. No wheezes, rales or ronchi noted.  Abdomen: Soft and nontender. No CVA tenderness noted. Musculoskeletal:  No difficulty with gait.  Neurological: Alert and oriented.   BMET    Component Value Date/Time   NA 140 10/22/2023 0909   NA 135 10/18/2014 1311   K 4.4 10/22/2023 0909   K 4.4 10/18/2014 1311   CL 105 10/22/2023 0909   CL 103 10/18/2014 1311   CO2 25 10/22/2023 0909   CO2 25 10/18/2014 1311   GLUCOSE 104 (H) 10/22/2023 0909   GLUCOSE 89 10/18/2014 1311   BUN 19 10/22/2023 0909   BUN 24 (H) 10/18/2014 1311   CREATININE 0.90 10/22/2023 0909   CALCIUM 10.1 10/22/2023 0909   CALCIUM 9.3 10/18/2014 1311   GFRNONAA 46 (L) 04/19/2019 1820   GFRNONAA >60 10/18/2014 1311   GFRAA 53 (L) 04/19/2019 1820   GFRAA >60 10/18/2014 1311    Lipid Panel     Component Value Date/Time   CHOL 192 10/22/2023 0909   TRIG 101 10/22/2023 0909   HDL 64 10/22/2023 0909   CHOLHDL 3.0 10/22/2023 0909   LDLCALC 109 (H) 10/22/2023 0909    CBC    Component Value Date/Time   WBC 7.9 10/22/2023 0909   RBC 4.72 10/22/2023 0909   HGB 14.2 10/22/2023 0909   HGB 12.5 10/18/2014 1311   HCT 44.3 10/22/2023 0909   HCT 38.3 10/18/2014 1311   PLT 227 10/22/2023 0909   PLT 238  10/18/2014 1311   MCV 93.9 10/22/2023 0909   MCV 93 10/18/2014 1311   MCH 30.1 10/22/2023 0909   MCHC 32.1 10/22/2023 0909   RDW 12.6 10/22/2023 0909   RDW 12.8 10/18/2014 1311   LYMPHSABS 2.4 04/02/2016 1508   LYMPHSABS 2.0 10/18/2014 1311   MONOABS 0.6 04/02/2016 1508   MONOABS 0.5 10/18/2014 1311   EOSABS 0.3 04/02/2016 1508   EOSABS 0.3 10/18/2014 1311   BASOSABS 0.1 04/02/2016 1508   BASOSABS 0.0 10/18/2014 1311    Hgb A1C Lab Results  Component Value Date   HGBA1C 6.0 (H) 10/22/2023           Assessment & Plan:   Assessment and Plan    Urinary tract infection  Urinary frequency, burning with urination, pelvic pressure and blood in urine.  Urinalysis shows trace blood and leukocytes. Previous amoxicillin use inappropriate. Bacterial infection likely. Discussed bladder anatomy changes and incomplete emptying as potential causes of recurrent infections. - Prescribed Macrobid 100 mg BID for 5 days. - Encouraged increased fluid intake. - Sent urine sample for culture. - Instructed to stop antibiotics if culture negative. - Plan to call with culture results by Friday.        RTC in 1 months, follow-up chronic conditions Angeline Laura, NP

## 2024-04-19 NOTE — Patient Instructions (Signed)

## 2024-04-20 LAB — URINE CULTURE
MICRO NUMBER:: 17164731
Result:: NO GROWTH
SPECIMEN QUALITY:: ADEQUATE

## 2024-04-21 ENCOUNTER — Ambulatory Visit: Payer: Self-pay | Admitting: Internal Medicine

## 2024-04-25 ENCOUNTER — Ambulatory Visit: Admitting: Internal Medicine
